# Patient Record
Sex: Female | Born: 1945 | Hispanic: No | Marital: Single | Smoking: Former smoker
Health system: Southern US, Community
[De-identification: ages and names within clinical notes are randomized; demographics above are authoritative.]

## PROBLEM LIST (undated history)

## (undated) DIAGNOSIS — K759 Inflammatory liver disease, unspecified: Secondary | ICD-10-CM

## (undated) DIAGNOSIS — F419 Anxiety disorder, unspecified: Secondary | ICD-10-CM

---

## 2004-02-06 ENCOUNTER — Other Ambulatory Visit: Admission: RE | Admit: 2004-02-06 | Discharge: 2004-02-06 | Payer: Self-pay | Admitting: Family Medicine

## 2010-10-19 ENCOUNTER — Encounter: Payer: Self-pay | Admitting: Family Medicine

## 2018-05-02 DIAGNOSIS — R69 Illness, unspecified: Secondary | ICD-10-CM | POA: Diagnosis not present

## 2018-05-02 DIAGNOSIS — E7800 Pure hypercholesterolemia, unspecified: Secondary | ICD-10-CM | POA: Diagnosis not present

## 2018-05-02 DIAGNOSIS — Z720 Tobacco use: Secondary | ICD-10-CM | POA: Diagnosis not present

## 2018-05-02 DIAGNOSIS — R748 Abnormal levels of other serum enzymes: Secondary | ICD-10-CM | POA: Diagnosis not present

## 2018-05-02 DIAGNOSIS — I10 Essential (primary) hypertension: Secondary | ICD-10-CM | POA: Diagnosis not present

## 2018-05-02 DIAGNOSIS — R7301 Impaired fasting glucose: Secondary | ICD-10-CM | POA: Diagnosis not present

## 2018-05-03 DIAGNOSIS — R740 Nonspecific elevation of levels of transaminase and lactic acid dehydrogenase [LDH]: Secondary | ICD-10-CM | POA: Diagnosis not present

## 2018-05-03 DIAGNOSIS — R7989 Other specified abnormal findings of blood chemistry: Secondary | ICD-10-CM | POA: Diagnosis not present

## 2018-05-05 ENCOUNTER — Other Ambulatory Visit: Payer: Self-pay | Admitting: Family Medicine

## 2018-05-05 DIAGNOSIS — R945 Abnormal results of liver function studies: Principal | ICD-10-CM

## 2018-05-05 DIAGNOSIS — R7989 Other specified abnormal findings of blood chemistry: Secondary | ICD-10-CM

## 2018-05-06 DIAGNOSIS — R740 Nonspecific elevation of levels of transaminase and lactic acid dehydrogenase [LDH]: Secondary | ICD-10-CM | POA: Diagnosis not present

## 2018-05-11 ENCOUNTER — Ambulatory Visit
Admission: RE | Admit: 2018-05-11 | Discharge: 2018-05-11 | Disposition: A | Discharge status: Home / Self Care | Payer: Medicare HMO | Source: Ambulatory Visit | Attending: Family Medicine | Admitting: Family Medicine

## 2018-05-11 DIAGNOSIS — R7989 Other specified abnormal findings of blood chemistry: Secondary | ICD-10-CM

## 2018-05-11 DIAGNOSIS — R945 Abnormal results of liver function studies: Principal | ICD-10-CM

## 2018-05-17 DIAGNOSIS — R69 Illness, unspecified: Secondary | ICD-10-CM | POA: Diagnosis not present

## 2018-05-18 DIAGNOSIS — K7689 Other specified diseases of liver: Secondary | ICD-10-CM | POA: Diagnosis not present

## 2018-05-18 DIAGNOSIS — R7989 Other specified abnormal findings of blood chemistry: Secondary | ICD-10-CM | POA: Diagnosis not present

## 2018-05-19 DIAGNOSIS — R7989 Other specified abnormal findings of blood chemistry: Secondary | ICD-10-CM | POA: Diagnosis not present

## 2018-05-19 DIAGNOSIS — R945 Abnormal results of liver function studies: Secondary | ICD-10-CM | POA: Diagnosis not present

## 2018-05-26 ENCOUNTER — Other Ambulatory Visit (HOSPITAL_COMMUNITY): Payer: Self-pay | Admitting: Gastroenterology

## 2018-05-26 DIAGNOSIS — R945 Abnormal results of liver function studies: Principal | ICD-10-CM

## 2018-05-26 DIAGNOSIS — R7989 Other specified abnormal findings of blood chemistry: Secondary | ICD-10-CM

## 2018-05-26 DIAGNOSIS — R740 Nonspecific elevation of levels of transaminase and lactic acid dehydrogenase [LDH]: Secondary | ICD-10-CM | POA: Diagnosis not present

## 2018-06-01 ENCOUNTER — Other Ambulatory Visit: Payer: Self-pay | Admitting: Student

## 2018-06-01 ENCOUNTER — Other Ambulatory Visit: Payer: Self-pay | Admitting: Radiology

## 2018-06-02 ENCOUNTER — Other Ambulatory Visit: Payer: Self-pay

## 2018-06-02 ENCOUNTER — Ambulatory Visit (HOSPITAL_COMMUNITY)
Admission: RE | Admit: 2018-06-02 | Discharge: 2018-06-02 | Disposition: A | Discharge status: Home / Self Care | Payer: Medicare HMO | Source: Ambulatory Visit | Attending: Gastroenterology | Admitting: Gastroenterology

## 2018-06-02 ENCOUNTER — Other Ambulatory Visit: Payer: Self-pay | Admitting: Radiology

## 2018-06-02 DIAGNOSIS — R748 Abnormal levels of other serum enzymes: Secondary | ICD-10-CM | POA: Diagnosis not present

## 2018-06-02 DIAGNOSIS — Z79899 Other long term (current) drug therapy: Secondary | ICD-10-CM | POA: Insufficient documentation

## 2018-06-02 DIAGNOSIS — R945 Abnormal results of liver function studies: Secondary | ICD-10-CM

## 2018-06-02 DIAGNOSIS — K740 Hepatic fibrosis: Secondary | ICD-10-CM | POA: Diagnosis not present

## 2018-06-02 DIAGNOSIS — R790 Abnormal level of blood mineral: Secondary | ICD-10-CM | POA: Diagnosis not present

## 2018-06-02 DIAGNOSIS — R7989 Other specified abnormal findings of blood chemistry: Secondary | ICD-10-CM

## 2018-06-02 DIAGNOSIS — I10 Essential (primary) hypertension: Secondary | ICD-10-CM | POA: Diagnosis not present

## 2018-06-02 LAB — CBC
HEMATOCRIT: 44.1 % (ref 36.0–46.0)
Hemoglobin: 14.6 g/dL (ref 12.0–15.0)
MCH: 30.2 pg (ref 26.0–34.0)
MCHC: 33.1 g/dL (ref 30.0–36.0)
MCV: 91.3 fL (ref 78.0–100.0)
PLATELETS: 231 10*3/uL (ref 150–400)
RBC: 4.83 MIL/uL (ref 3.87–5.11)
RDW: 16.8 % — AB (ref 11.5–15.5)
WBC: 7.4 10*3/uL (ref 4.0–10.5)

## 2018-06-02 LAB — PROTIME-INR
INR: 0.92
Prothrombin Time: 12.3 seconds (ref 11.4–15.2)

## 2018-06-02 MED ORDER — OXYCODONE HCL 5 MG PO TABS: 5.0000 mg | ORAL_TABLET | ORAL | Status: DC | PRN

## 2018-06-02 MED ORDER — SODIUM CHLORIDE 0.9 % IV SOLN
INTRAVENOUS | Status: AC | PRN
  Administered 2018-06-02: 10 mL/h via INTRAVENOUS

## 2018-06-02 MED ORDER — GELATIN ABSORBABLE 12-7 MM EX MISC
CUTANEOUS | Status: AC
  Filled 2018-06-02: qty 1

## 2018-06-02 MED ORDER — MIDAZOLAM HCL 2 MG/2ML IJ SOLN
INTRAMUSCULAR | Status: AC
  Filled 2018-06-02: qty 2

## 2018-06-02 MED ORDER — FENTANYL CITRATE (PF) 100 MCG/2ML IJ SOLN
INTRAMUSCULAR | Status: AC
  Filled 2018-06-02: qty 2

## 2018-06-02 MED ORDER — MIDAZOLAM HCL 2 MG/2ML IJ SOLN
INTRAMUSCULAR | Status: AC | PRN
  Administered 2018-06-02 (×2): 1 mg via INTRAVENOUS

## 2018-06-02 MED ORDER — HYDRALAZINE HCL 20 MG/ML IJ SOLN
INTRAMUSCULAR | Status: AC
  Filled 2018-06-02: qty 1

## 2018-06-02 MED ORDER — SODIUM CHLORIDE 0.9 % IV SOLN: INTRAVENOUS | Status: DC

## 2018-06-02 MED ORDER — HYDRALAZINE HCL 20 MG/ML IJ SOLN
INTRAMUSCULAR | Status: AC | PRN
  Administered 2018-06-02: 5 mg via INTRAVENOUS

## 2018-06-02 MED ORDER — LIDOCAINE HCL (PF) 1 % IJ SOLN
INTRAMUSCULAR | Status: AC
  Filled 2018-06-02: qty 30

## 2018-06-02 MED ORDER — FENTANYL CITRATE (PF) 100 MCG/2ML IJ SOLN
INTRAMUSCULAR | Status: AC | PRN
  Administered 2018-06-02 (×4): 25 ug via INTRAVENOUS

## 2018-06-02 NOTE — Procedures (Signed)
  Pre-operative Diagnosis: Elevated liver enzymes      Post-operative Diagnosis: Elevated liver enzymes      Indications: Needs biopsy  Procedure: US guided random liver biopsy  Findings: 3 cores from right hepatic lobe.  Gelfoam slurry injected after biopsy.  Complications: None     EBL: Minimal  Plan: Bedrest 3 hours.

## 2018-06-02 NOTE — H&P (Signed)
Chief Complaint: Patient was seen in consultation today for image guided random core liver biopsy  Referring Physician(s): Outlaw,William  Supervising Physician: Richarda Overlie  Patient Status: [facility] - Out-pt  History of Present Illness: [name] [name] is a 72 y.o. female smoker with history of hypertension, elevated liver function tests as well as elevated iron indices/storage who presents today for image guided random core liver biopsy for further evaluation.   No past medical history on file.     Allergies: Patient has no known allergies.  Medications: Prior to Admission medications   Medication Sig Start Date End Date Taking? Authorizing Provider  metoprolol succinate (TOPROL-XL) 50 MG 24 hr tablet Take 50 mg by mouth daily. Take with or immediately following a meal.   Yes [provider]     No family history on file.  Social History   Socioeconomic History  . Marital status: Single    Spouse name: Not on file  . Number of children: Not on file  . Years of education: Not on file  . Highest education level: Not on file  Occupational History  . Not on file  Social Needs  . Financial resource strain: Not on file  . Food insecurity:    Worry: Not on file    Inability: Not on file  . Transportation needs:    Medical: Not on file    Non-medical: Not on file  Tobacco Use  . Smoking status: Not on file  Substance and Sexual Activity  . Alcohol use: Not on file  . Drug use: Not on file  . Sexual activity: Not on file  Lifestyle  . Physical activity:    Days per week: Not on file    Minutes per session: Not on file  . Stress: Not on file  Relationships  . Social connections:    Talks on phone: Not on file    Gets together: Not on file    Attends religious service: Not on file    Active member of club or organization: Not on file    Attends meetings of clubs or organizations: Not on file    Relationship status: Not on file  Other Topics Concern   . Not on file  Social History Narrative  . Not on file      Review of Systems currently denies fever, headache, chest pain, dyspnea, cough, abdominal/back pain, nausea, vomiting She is anxious.  Vital Signs: BP (!) 236/89 (BP Location: Right Arm)   Pulse 79   Temp 97.8 F (36.6 C) (Oral)   Ht 5\' 3"  (1.6 m)   Wt 125 lb (56.7 kg)   SpO2 100%   BMI 22.14 kg/m   Physical Exam awake, alert.  Chest with distant breath sounds bilaterally. Heart with regular rate and rhythm.  Abdomen soft, positive bowel sounds, nontender.  No lower extremity edema  Imaging: US Abdomen Limited Ruq  Result Date: 05/11/2018 CLINICAL DATA:  Elevated liver function tests EXAM: ULTRASOUND ABDOMEN LIMITED RIGHT UPPER QUADRANT COMPARISON:  None. FINDINGS: Gallbladder: No gallstones or wall thickening visualized. No sonographic Murphy sign noted by sonographer. Common bile duct: Diameter: 1.8 mm Liver: No focal lesion identified. Within normal limits in parenchymal echogenicity. Portal vein is patent on color Doppler imaging with normal direction of blood flow towards the liver. IMPRESSION: Negative right upper quadrant ultrasound Electronically Signed   By: Marlan Palau M.D.   On: 05/11/2018 15:55    Labs:  CBC: No results for input(s): WBC, HGB, HCT,  PLT in the last 8760 hours.  COAGS: No results for input(s): INR, APTT in the last 8760 hours.  BMP: No results for input(s): NA, K, CL, CO2, GLUCOSE, BUN, CALCIUM, CREATININE, GFRNONAA, GFRAA in the last 8760 hours.  Invalid input(s): CMP  LIVER FUNCTION TESTS: No results for input(s): BILITOT, AST, ALT, ALKPHOS, PROT, ALBUMIN in the last 8760 hours.  TUMOR MARKERS: No results for input(s): AFPTM, CEA, CA199, CHROMGRNA in the last 8760 hours.  Assessment and Plan: 72 y.o. female smoker with history of hypertension, elevated liver function tests as well as elevated iron indices/storage who presents today for image guided random core liver biopsy for  further evaluation. Risks and benefits discussed with the patient/sister including, but not limited to bleeding, infection, damage to adjacent structures or low yield requiring additional tests.  All of the patient's questions were answered, patient is agreeable to proceed. Consent signed and in chart.  LABS PENDING    Thank you for this interesting consult.  I greatly enjoyed meeting Sharma Lawrance Vu and look forward to participating in their care.  A copy of this report was sent to the requesting provider on this date.  Electronically Signed: D. Jeananne Rama, PA-C 06/02/2018, 11:17 AM   I spent a total of  25 minutes   in face to face in clinical consultation, greater than 50% of which was counseling/coordinating care for image guided random core liver biopsy

## 2018-06-02 NOTE — Discharge Instructions (Addendum)
Liver Biopsy, Care After °These instructions give you information on caring for yourself after your procedure. Your doctor may also give you more specific instructions. Call your doctor if you have any problems or questions after your procedure. °Follow these instructions at home: °· Rest at home for 1-2 days or as told by your doctor. °· Have someone stay with you for at least 24 hours. °· Do not do these things in the first 24 hours: °? Drive. °? Use machinery. °? Take care of other people. °? Sign legal documents. °? Take a bath or shower. °· There are many different ways to close and cover a cut (incision). For example, a cut can be closed with stitches, skin glue, or adhesive strips. Follow your doctor's instructions on: °? Taking care of your cut. °? Changing and removing your bandage (dressing). °? Removing whatever was used to close your cut. °· Do not drink alcohol in the first week. °· Do not lift more than 5 pounds or play contact sports for the first 2 weeks. °· Take medicines only as told by your doctor. For 1 week, do not take medicine that has aspirin in it or medicines like ibuprofen. °· Get your test results. °Contact a doctor if: °· A cut bleeds and leaves more than just a small spot of blood. °· A cut is red, puffs up (swells), or hurts more than before. °· Fluid or something else comes from a cut. °· A cut smells bad. °· You have a fever or chills. °Get help right away if: °· You have swelling, bloating, or pain in your belly (abdomen). °· You get dizzy or faint. °· You have a rash. °· You feel sick to your stomach (nauseous) or throw up (vomit). °· You have trouble breathing, feel short of breath, or feel faint. °· Your chest hurts. °· You have problems talking or seeing. °· You have trouble balancing or moving your arms or legs. °This information is not intended to replace advice given to you by your health care provider. Make sure you discuss any questions you have with your health care  provider. °Document Released: 06/23/2008 Document Revised: 02/20/2016 Document Reviewed: 11/10/2013 °Elsevier Interactive Patient Education © 2018 [employer] ° ° ° ° °Moderate Conscious Sedation, Adult, Care After °These instructions provide you with information about caring for yourself after your procedure. Your health care provider may also give you more specific instructions. Your treatment has been planned according to current medical practices, but problems sometimes occur. Call your health care provider if you have any problems or questions after your procedure. °What can I expect after the procedure? °After your procedure, it is common: °· To feel sleepy for several hours. °· To feel clumsy and have poor balance for several hours. °· To have poor judgment for several hours. °· To vomit if you eat too soon. ° °Follow these instructions at home: °For at least 24 hours after the procedure: ° °· Do not: °? Participate in activities where you could fall or become injured. °? Drive. °? Use heavy machinery. °? Drink alcohol. °? Take sleeping pills or medicines that cause drowsiness. °? Make important decisions or sign legal documents. °? Take care of children on your own. °· Rest. °Eating and drinking °· Follow the diet recommended by your health care provider. °· If you vomit: °? Drink water, juice, or soup when you can drink without vomiting. °? Make sure you have little or no nausea before eating solid foods. °General instructions °·   Have a responsible adult stay with you until you are awake and alert. °· Take over-the-counter and prescription medicines only as told by your health care provider. °· If you smoke, do not smoke without supervision. °· Keep all follow-up visits as told by your health care provider. This is important. °Contact a health care provider if: °· You keep feeling nauseous or you keep vomiting. °· You feel light-headed. °· You develop a rash. °· You have a fever. °Get help right away  if: °· You have trouble breathing. °This information is not intended to replace advice given to you by your health care provider. Make sure you discuss any questions you have with your health care provider. °Document Released: 07/05/2013 Document Revised: 02/17/2016 Document Reviewed: 01/04/2016 °Elsevier Interactive Patient Education © 2018 [employer] Inc[employer] ° ° °

## 2018-06-17 DIAGNOSIS — R69 Illness, unspecified: Secondary | ICD-10-CM | POA: Diagnosis not present

## 2018-06-17 DIAGNOSIS — I10 Essential (primary) hypertension: Secondary | ICD-10-CM | POA: Diagnosis not present

## 2018-06-17 DIAGNOSIS — R945 Abnormal results of liver function studies: Secondary | ICD-10-CM | POA: Diagnosis not present

## 2018-06-17 DIAGNOSIS — R000 Tachycardia, unspecified: Secondary | ICD-10-CM | POA: Diagnosis not present

## 2018-06-21 DIAGNOSIS — R69 Illness, unspecified: Secondary | ICD-10-CM | POA: Diagnosis not present

## 2018-06-21 DIAGNOSIS — I10 Essential (primary) hypertension: Secondary | ICD-10-CM | POA: Diagnosis not present

## 2018-06-21 DIAGNOSIS — K754 Autoimmune hepatitis: Secondary | ICD-10-CM | POA: Diagnosis not present

## 2018-06-24 DIAGNOSIS — R69 Illness, unspecified: Secondary | ICD-10-CM | POA: Diagnosis not present

## 2018-06-24 DIAGNOSIS — I10 Essential (primary) hypertension: Secondary | ICD-10-CM | POA: Diagnosis not present

## 2018-06-24 DIAGNOSIS — K754 Autoimmune hepatitis: Secondary | ICD-10-CM | POA: Diagnosis not present

## 2018-07-15 ENCOUNTER — Other Ambulatory Visit (HOSPITAL_COMMUNITY)
Admission: RE | Admit: 2018-07-15 | Discharge: 2018-07-15 | Disposition: A | Discharge status: Home / Self Care | Payer: Medicare HMO | Source: Ambulatory Visit | Attending: Anatomic Pathology | Admitting: Anatomic Pathology

## 2018-07-15 DIAGNOSIS — I10 Essential (primary) hypertension: Secondary | ICD-10-CM | POA: Insufficient documentation

## 2018-07-15 DIAGNOSIS — R945 Abnormal results of liver function studies: Secondary | ICD-10-CM | POA: Insufficient documentation

## 2018-07-15 DIAGNOSIS — R7301 Impaired fasting glucose: Secondary | ICD-10-CM | POA: Diagnosis not present

## 2018-07-15 DIAGNOSIS — F17200 Nicotine dependence, unspecified, uncomplicated: Secondary | ICD-10-CM | POA: Diagnosis not present

## 2018-07-15 DIAGNOSIS — E7800 Pure hypercholesterolemia, unspecified: Secondary | ICD-10-CM | POA: Diagnosis not present

## 2018-07-15 DIAGNOSIS — R69 Illness, unspecified: Secondary | ICD-10-CM | POA: Diagnosis not present

## 2018-07-15 DIAGNOSIS — K754 Autoimmune hepatitis: Secondary | ICD-10-CM | POA: Diagnosis not present

## 2018-07-19 DIAGNOSIS — K754 Autoimmune hepatitis: Secondary | ICD-10-CM | POA: Diagnosis not present

## 2018-08-09 ENCOUNTER — Encounter (HOSPITAL_COMMUNITY): Payer: Self-pay

## 2018-08-16 DIAGNOSIS — R69 Illness, unspecified: Secondary | ICD-10-CM | POA: Diagnosis not present

## 2018-08-22 DIAGNOSIS — R69 Illness, unspecified: Secondary | ICD-10-CM | POA: Diagnosis not present

## 2018-08-31 DIAGNOSIS — K754 Autoimmune hepatitis: Secondary | ICD-10-CM | POA: Diagnosis not present

## 2018-09-26 DIAGNOSIS — K754 Autoimmune hepatitis: Secondary | ICD-10-CM | POA: Diagnosis not present

## 2018-09-26 DIAGNOSIS — R69 Illness, unspecified: Secondary | ICD-10-CM | POA: Diagnosis not present

## 2018-09-26 DIAGNOSIS — I10 Essential (primary) hypertension: Secondary | ICD-10-CM | POA: Diagnosis not present

## 2018-10-27 DIAGNOSIS — R69 Illness, unspecified: Secondary | ICD-10-CM | POA: Diagnosis not present

## 2018-11-10 DIAGNOSIS — K754 Autoimmune hepatitis: Secondary | ICD-10-CM | POA: Diagnosis not present

## 2018-11-23 DIAGNOSIS — R69 Illness, unspecified: Secondary | ICD-10-CM | POA: Diagnosis not present

## 2018-12-20 DIAGNOSIS — Z720 Tobacco use: Secondary | ICD-10-CM | POA: Diagnosis not present

## 2018-12-20 DIAGNOSIS — K754 Autoimmune hepatitis: Secondary | ICD-10-CM | POA: Diagnosis not present

## 2018-12-20 DIAGNOSIS — K740 Hepatic fibrosis: Secondary | ICD-10-CM | POA: Diagnosis not present

## 2018-12-26 DIAGNOSIS — R945 Abnormal results of liver function studies: Secondary | ICD-10-CM | POA: Diagnosis not present

## 2018-12-26 DIAGNOSIS — K740 Hepatic fibrosis: Secondary | ICD-10-CM | POA: Diagnosis not present

## 2018-12-26 DIAGNOSIS — K754 Autoimmune hepatitis: Secondary | ICD-10-CM | POA: Diagnosis not present

## 2018-12-27 DIAGNOSIS — K754 Autoimmune hepatitis: Secondary | ICD-10-CM | POA: Diagnosis not present

## 2019-02-13 DIAGNOSIS — I10 Essential (primary) hypertension: Secondary | ICD-10-CM | POA: Diagnosis not present

## 2019-02-13 DIAGNOSIS — R531 Weakness: Secondary | ICD-10-CM | POA: Diagnosis not present

## 2019-02-13 DIAGNOSIS — K754 Autoimmune hepatitis: Secondary | ICD-10-CM | POA: Diagnosis not present

## 2019-02-13 DIAGNOSIS — R7989 Other specified abnormal findings of blood chemistry: Secondary | ICD-10-CM | POA: Diagnosis not present

## 2019-02-21 DIAGNOSIS — K754 Autoimmune hepatitis: Secondary | ICD-10-CM | POA: Diagnosis not present

## 2019-02-22 DIAGNOSIS — R69 Illness, unspecified: Secondary | ICD-10-CM | POA: Diagnosis not present

## 2019-03-27 DIAGNOSIS — K754 Autoimmune hepatitis: Secondary | ICD-10-CM | POA: Diagnosis not present

## 2019-05-29 DIAGNOSIS — R69 Illness, unspecified: Secondary | ICD-10-CM | POA: Diagnosis not present

## 2019-08-03 DIAGNOSIS — K754 Autoimmune hepatitis: Secondary | ICD-10-CM | POA: Diagnosis not present

## 2019-11-06 DIAGNOSIS — K754 Autoimmune hepatitis: Secondary | ICD-10-CM | POA: Diagnosis not present

## 2019-11-13 DIAGNOSIS — E875 Hyperkalemia: Secondary | ICD-10-CM | POA: Diagnosis not present

## 2019-12-19 DIAGNOSIS — R69 Illness, unspecified: Secondary | ICD-10-CM | POA: Diagnosis not present

## 2020-02-01 DIAGNOSIS — K754 Autoimmune hepatitis: Secondary | ICD-10-CM | POA: Diagnosis not present

## 2020-02-03 ENCOUNTER — Ambulatory Visit: Payer: Medicare HMO | Attending: Internal Medicine

## 2020-02-03 DIAGNOSIS — Z23 Encounter for immunization: Secondary | ICD-10-CM

## 2020-02-03 NOTE — Progress Notes (Signed)
   Covid-19 Vaccination Clinic  Name:  Adalea Handler    MRN: [id number] DOB: 01-Nov-1945  02/03/2020  Ms. Doeden was observed post Covid-19 immunization for 15 minutes without incident. She was provided with Vaccine Information Sheet and instruction to access the V-Safe system.   Ms. Poulton was instructed to call 911 with any severe reactions post vaccine: Marland Kitchen Difficulty breathing  . Swelling of face and throat  . A fast heartbeat  . A bad rash all over body  . Dizziness and weakness   Immunizations Administered    Name Date Dose VIS Date Route   Pfizer COVID-19 Vaccine 02/03/2020  9:10 AM 0.3 mL 11/22/2018 Intramuscular   Manufacturer: [employer], [employer]   Lot: [id number]   NDC: [address]-2

## 2020-02-05 DIAGNOSIS — E7800 Pure hypercholesterolemia, unspecified: Secondary | ICD-10-CM | POA: Diagnosis not present

## 2020-02-05 DIAGNOSIS — K754 Autoimmune hepatitis: Secondary | ICD-10-CM | POA: Diagnosis not present

## 2020-02-05 DIAGNOSIS — R69 Illness, unspecified: Secondary | ICD-10-CM | POA: Diagnosis not present

## 2020-02-05 DIAGNOSIS — R7301 Impaired fasting glucose: Secondary | ICD-10-CM | POA: Diagnosis not present

## 2020-02-05 DIAGNOSIS — I10 Essential (primary) hypertension: Secondary | ICD-10-CM | POA: Diagnosis not present

## 2020-02-27 ENCOUNTER — Ambulatory Visit: Payer: Medicare HMO | Attending: Internal Medicine

## 2020-02-27 DIAGNOSIS — Z23 Encounter for immunization: Secondary | ICD-10-CM

## 2020-02-27 NOTE — Progress Notes (Signed)
   Covid-19 Vaccination Clinic  Name:  Avon Molock    MRN: [id number] DOB: 1946-02-10  02/27/2020  Ms. Sinor was observed post Covid-19 immunization for 15 minutes without incident. She was provided with Vaccine Information Sheet and instruction to access the V-Safe system.   Ms. Fede was instructed to call 911 with any severe reactions post vaccine: Marland Kitchen Difficulty breathing  . Swelling of face and throat  . A fast heartbeat  . A bad rash all over body  . Dizziness and weakness   Immunizations Administered    Name Date Dose VIS Date Route   Pfizer COVID-19 Vaccine 02/27/2020  9:00 AM 0.3 mL 11/22/2018 Intramuscular   Manufacturer: [employer], [employer]   Lot: [id number]   NDC: [address]-9

## 2020-03-20 DIAGNOSIS — R69 Illness, unspecified: Secondary | ICD-10-CM | POA: Diagnosis not present

## 2020-05-06 DIAGNOSIS — E7800 Pure hypercholesterolemia, unspecified: Secondary | ICD-10-CM | POA: Diagnosis not present

## 2020-05-06 DIAGNOSIS — I10 Essential (primary) hypertension: Secondary | ICD-10-CM | POA: Diagnosis not present

## 2020-05-06 DIAGNOSIS — R69 Illness, unspecified: Secondary | ICD-10-CM | POA: Diagnosis not present

## 2020-05-06 DIAGNOSIS — K754 Autoimmune hepatitis: Secondary | ICD-10-CM | POA: Diagnosis not present

## 2020-05-06 DIAGNOSIS — R7301 Impaired fasting glucose: Secondary | ICD-10-CM | POA: Diagnosis not present

## 2020-05-08 DIAGNOSIS — E875 Hyperkalemia: Secondary | ICD-10-CM | POA: Diagnosis not present

## 2020-06-13 DIAGNOSIS — E7800 Pure hypercholesterolemia, unspecified: Secondary | ICD-10-CM | POA: Diagnosis not present

## 2020-06-13 DIAGNOSIS — K754 Autoimmune hepatitis: Secondary | ICD-10-CM | POA: Diagnosis not present

## 2020-06-13 DIAGNOSIS — I10 Essential (primary) hypertension: Secondary | ICD-10-CM | POA: Diagnosis not present

## 2020-06-13 DIAGNOSIS — R69 Illness, unspecified: Secondary | ICD-10-CM | POA: Diagnosis not present

## 2020-06-13 DIAGNOSIS — R7301 Impaired fasting glucose: Secondary | ICD-10-CM | POA: Diagnosis not present

## 2020-06-18 DIAGNOSIS — R69 Illness, unspecified: Secondary | ICD-10-CM | POA: Diagnosis not present

## 2020-08-02 IMAGING — US US ABDOMEN LIMITED
1 series · 14 of 25 positions shown · non-contrast
Comparison: None.

CLINICAL DATA: Elevated liver function tests

EXAM:
ULTRASOUND ABDOMEN LIMITED RIGHT UPPER QUADRANT

[Series 1: us abdomen limited · 0.15mm/px · 14 of 33 slices shown]
[im 1/33]
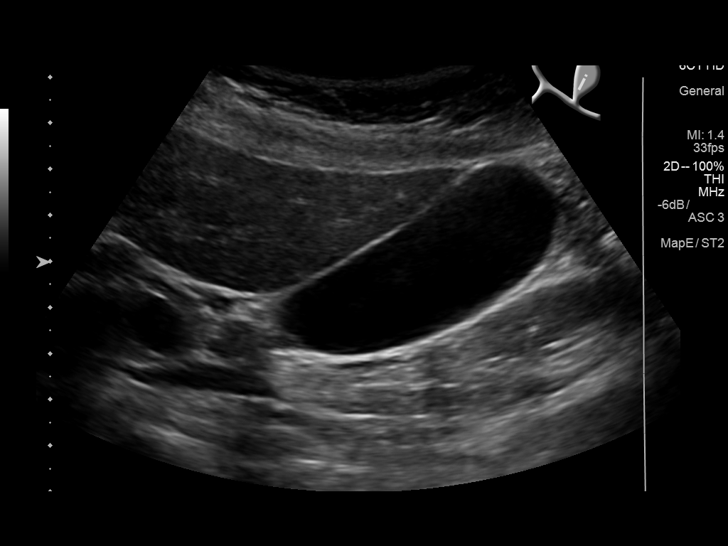
[im 3/33]
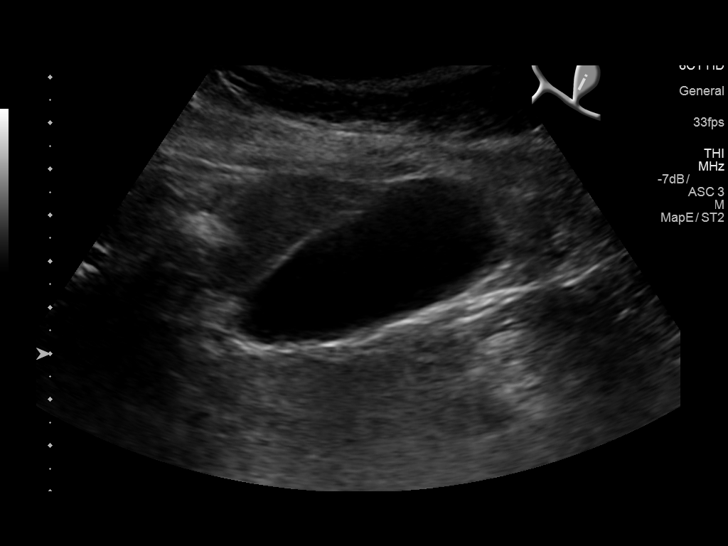
[im 6/33]
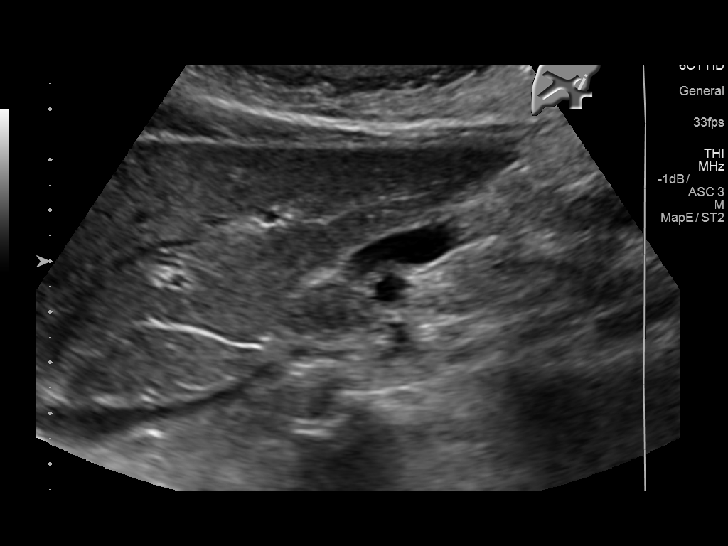
[im 9/33]
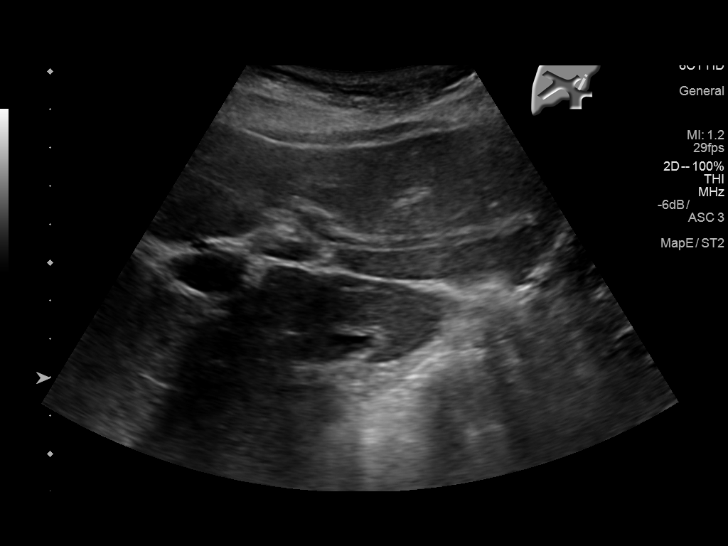
[im 11/33]
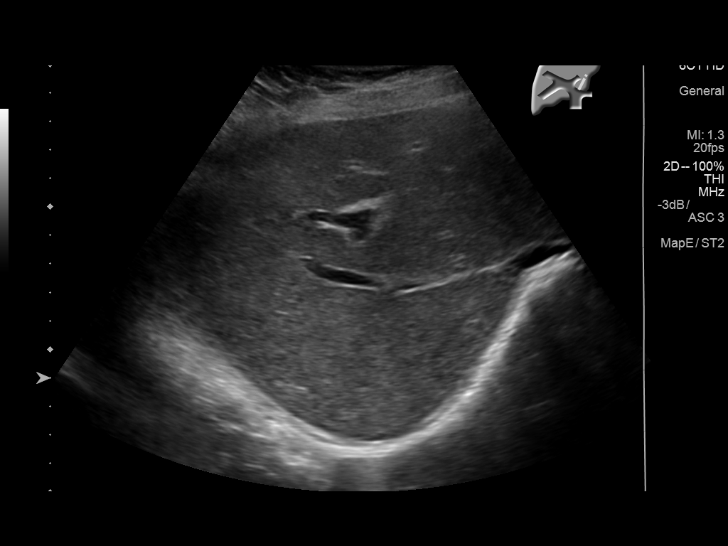
[im 13/33]
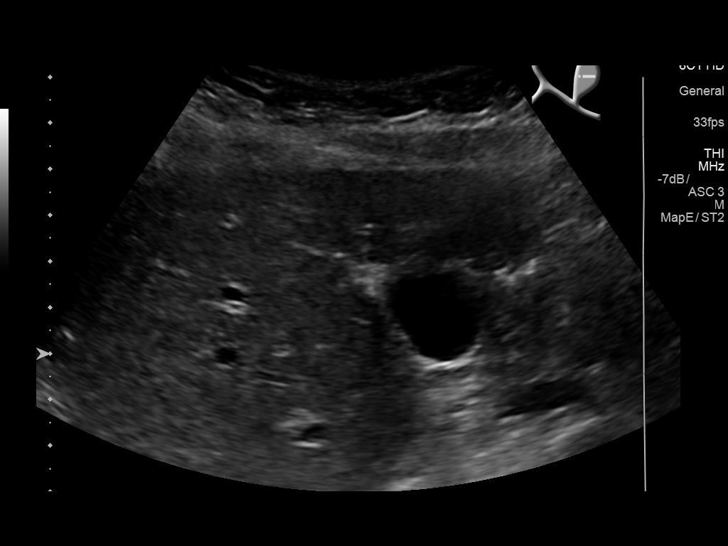
[im 15/33]
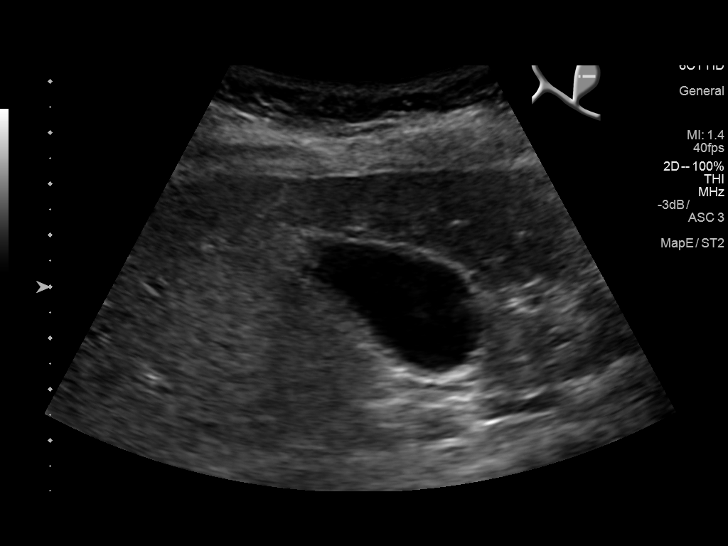
[im 18/33]
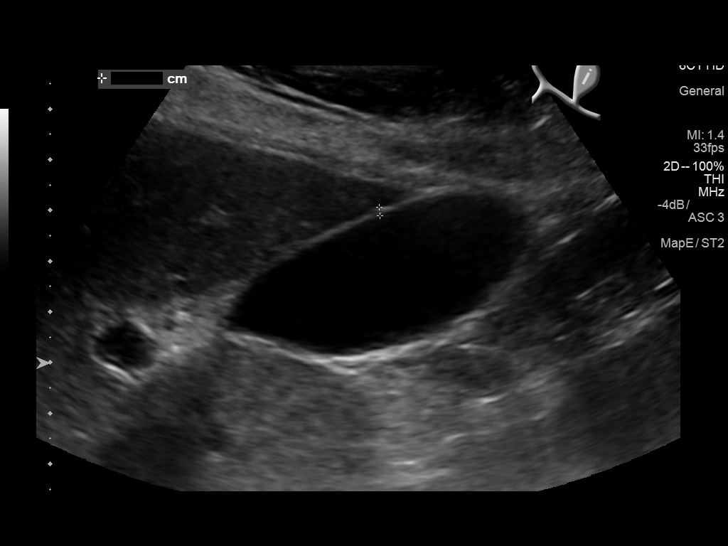
[im 21/33]
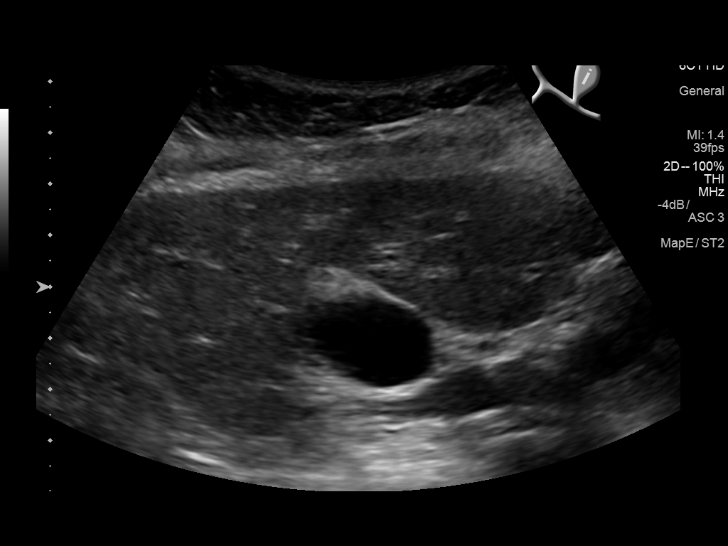
[im 22/33]
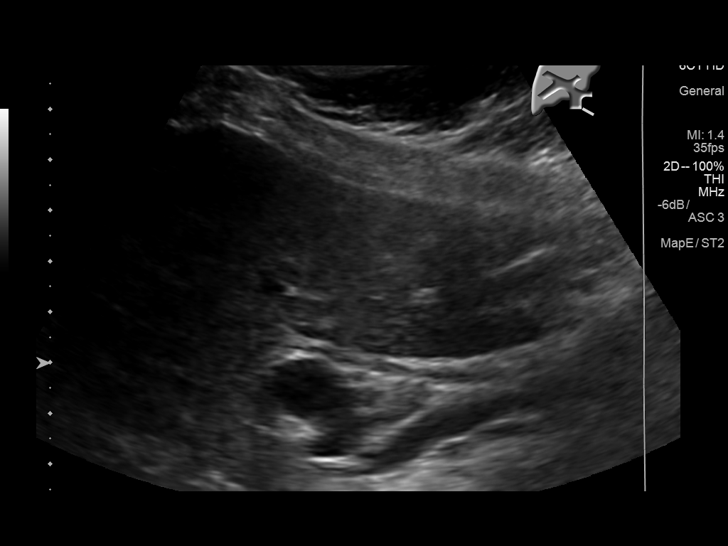
[im 25/33]
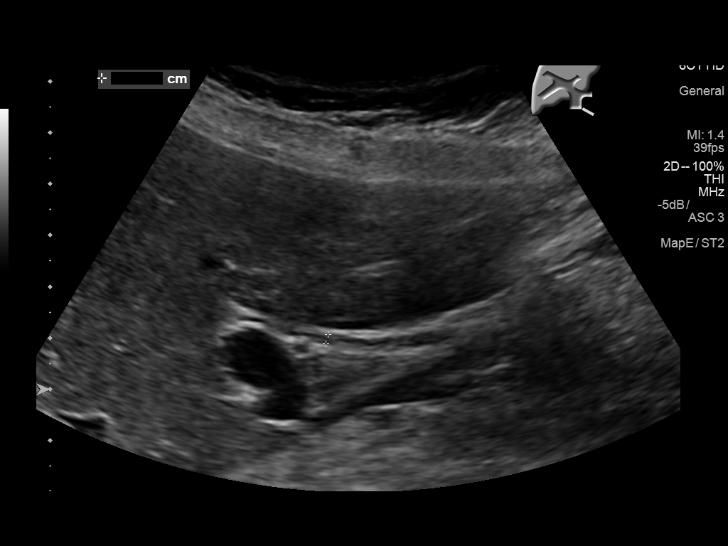
[im 27/33]
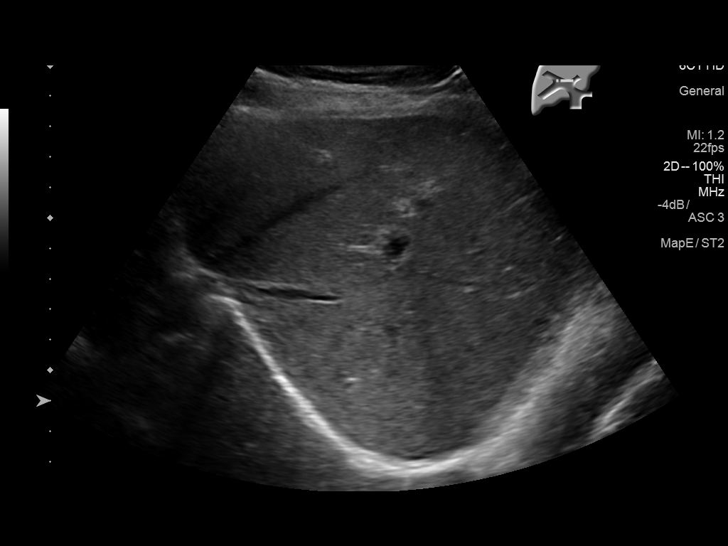
[im 30/33]
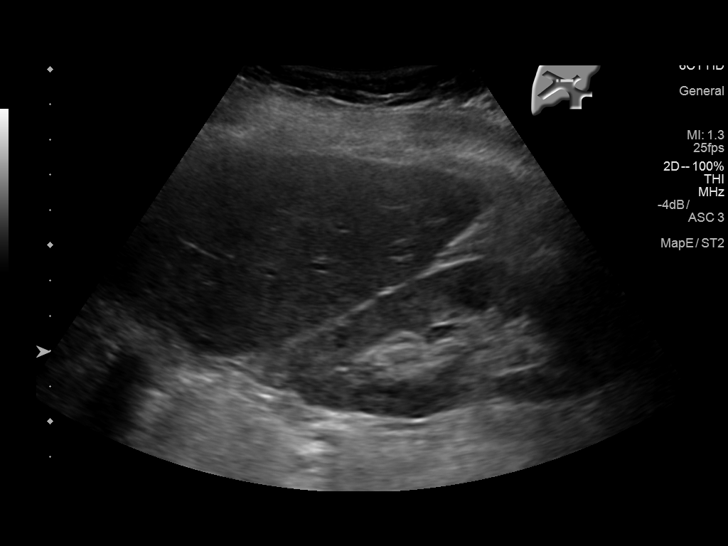
[im 33/33]
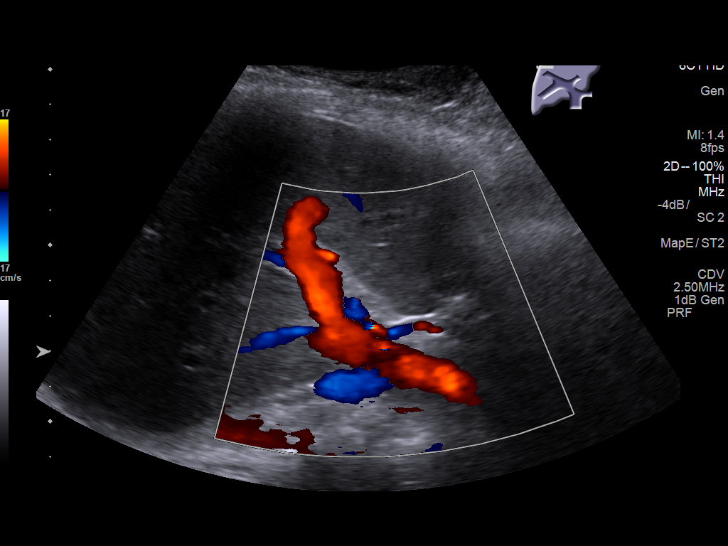

[14 of 25 positions shown; findings below may reference images not displayed]

FINDINGS: Gallbladder:

No gallstones or wall thickening visualized. No sonographic Murphy
sign noted by sonographer.

Common bile duct:

Diameter: 1.8 mm

Liver:

No focal lesion identified. Within normal limits in parenchymal
echogenicity. Portal vein is patent on color Doppler imaging with
normal direction of blood flow towards the liver.
IMPRESSION: Negative right upper quadrant ultrasound

## 2020-08-08 DIAGNOSIS — R7301 Impaired fasting glucose: Secondary | ICD-10-CM | POA: Diagnosis not present

## 2020-08-08 DIAGNOSIS — R69 Illness, unspecified: Secondary | ICD-10-CM | POA: Diagnosis not present

## 2020-08-08 DIAGNOSIS — E7800 Pure hypercholesterolemia, unspecified: Secondary | ICD-10-CM | POA: Diagnosis not present

## 2020-08-08 DIAGNOSIS — K754 Autoimmune hepatitis: Secondary | ICD-10-CM | POA: Diagnosis not present

## 2020-08-08 DIAGNOSIS — I10 Essential (primary) hypertension: Secondary | ICD-10-CM | POA: Diagnosis not present

## 2020-08-08 DIAGNOSIS — E875 Hyperkalemia: Secondary | ICD-10-CM | POA: Diagnosis not present

## 2020-08-13 DIAGNOSIS — R7301 Impaired fasting glucose: Secondary | ICD-10-CM | POA: Diagnosis not present

## 2020-08-13 DIAGNOSIS — R030 Elevated blood-pressure reading, without diagnosis of hypertension: Secondary | ICD-10-CM | POA: Diagnosis not present

## 2020-08-13 DIAGNOSIS — R69 Illness, unspecified: Secondary | ICD-10-CM | POA: Diagnosis not present

## 2020-08-13 DIAGNOSIS — K754 Autoimmune hepatitis: Secondary | ICD-10-CM | POA: Diagnosis not present

## 2020-08-13 DIAGNOSIS — I10 Essential (primary) hypertension: Secondary | ICD-10-CM | POA: Diagnosis not present

## 2020-08-13 DIAGNOSIS — Z720 Tobacco use: Secondary | ICD-10-CM | POA: Diagnosis not present

## 2020-08-13 DIAGNOSIS — E7800 Pure hypercholesterolemia, unspecified: Secondary | ICD-10-CM | POA: Diagnosis not present

## 2020-08-15 DIAGNOSIS — S8991XA Unspecified injury of right lower leg, initial encounter: Secondary | ICD-10-CM | POA: Diagnosis not present

## 2020-08-15 DIAGNOSIS — R030 Elevated blood-pressure reading, without diagnosis of hypertension: Secondary | ICD-10-CM | POA: Diagnosis not present

## 2020-08-15 DIAGNOSIS — S83421A Sprain of lateral collateral ligament of right knee, initial encounter: Secondary | ICD-10-CM | POA: Diagnosis not present

## 2020-08-24 IMAGING — US US BIOPSY CORE LIVER
1 series · 11 of 11 positions shown · non-contrast
Comparison: none

INDICATION: 72-year-old with elevated liver enzymes and increased storage iron.
Patient presents for random liver biopsy.

[Series 1: us biopsy core liver · 0.20mm/px · 11 of 11 slices shown]
[im 1/11]
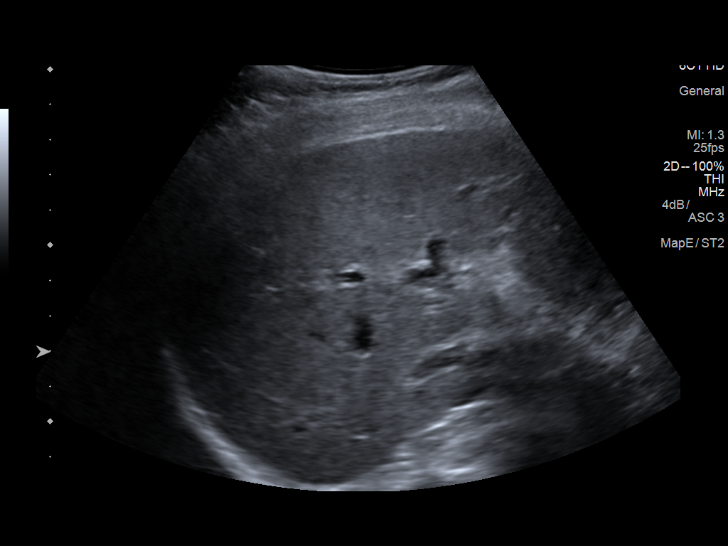
[im 2/11]
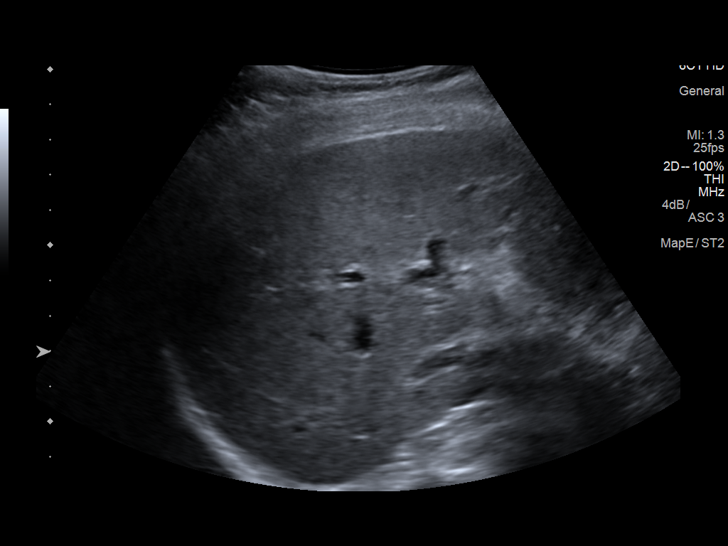
[im 3/11]
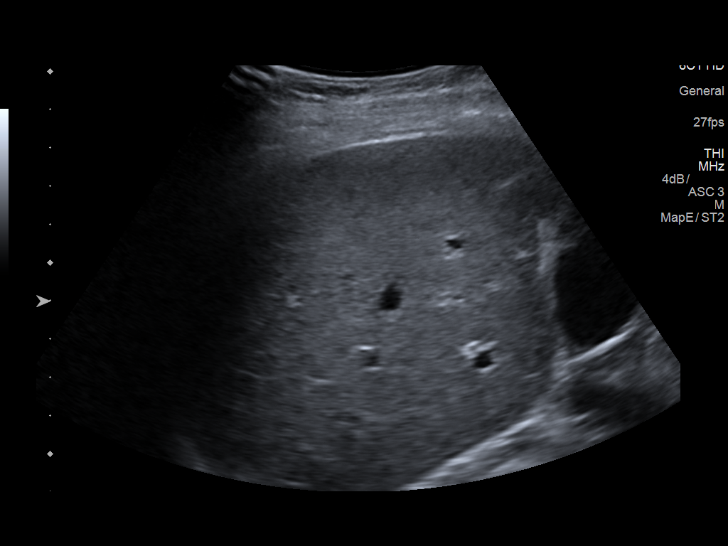
[im 4/11]
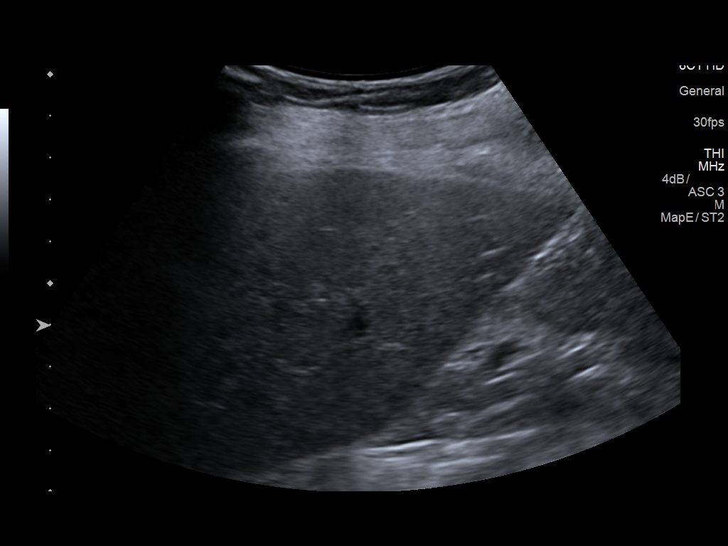
[im 5/11]
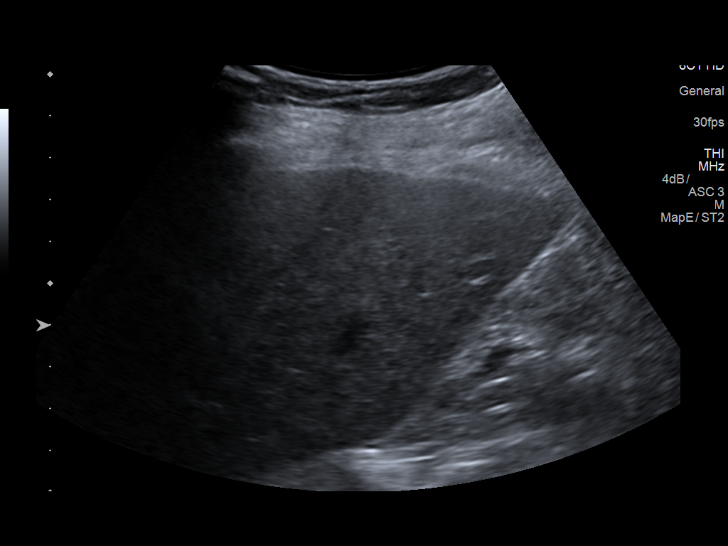
[im 6/11]
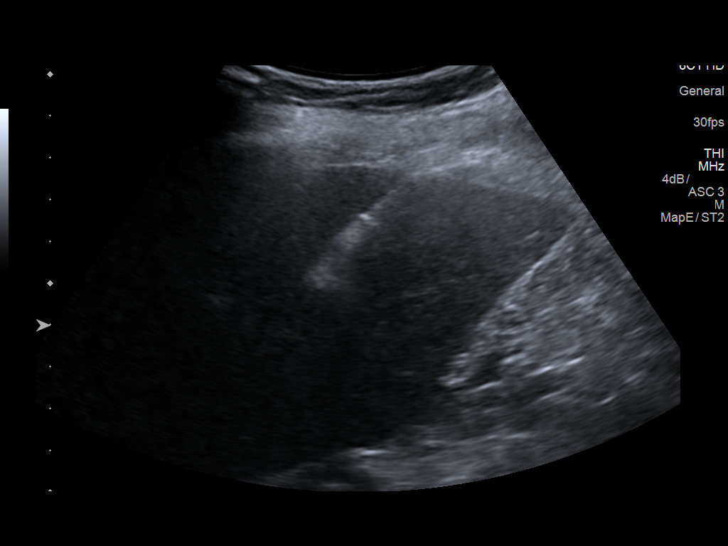
[im 7/11]
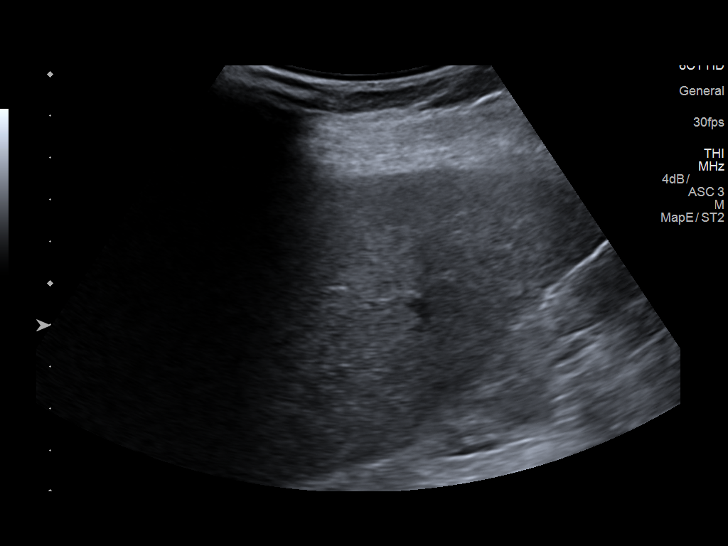
[im 8/11]
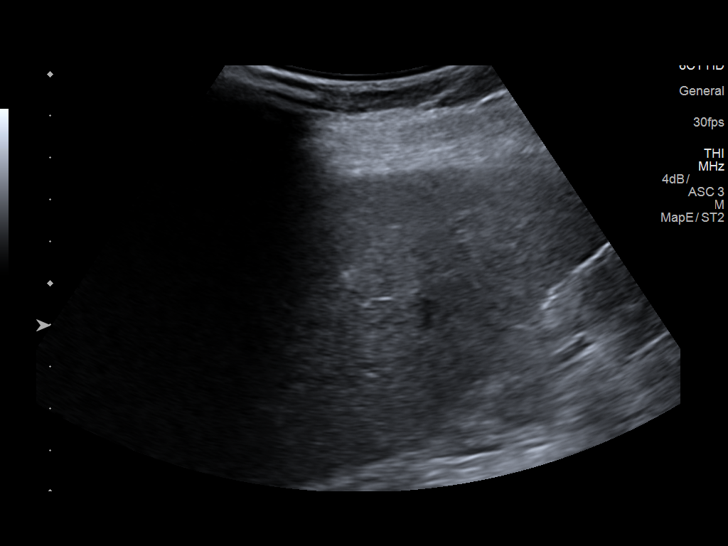
[im 9/11]
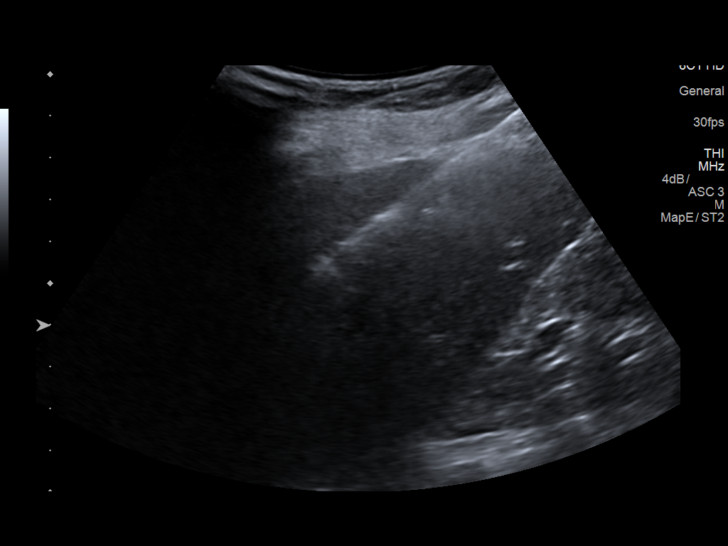
[im 10/11]
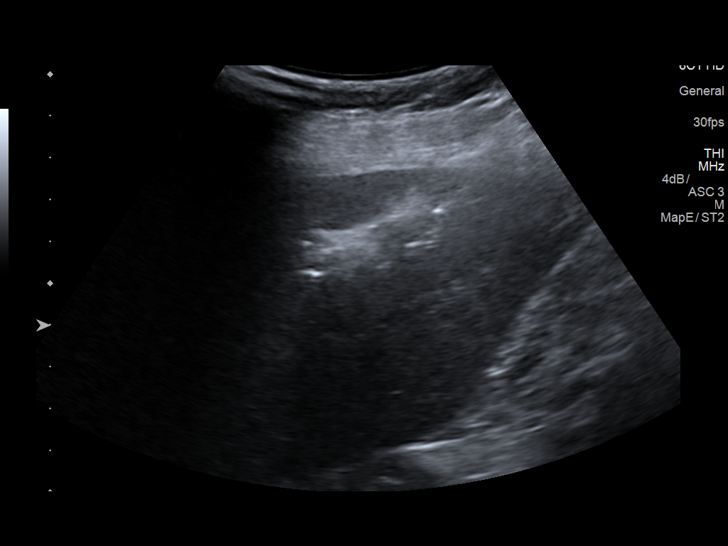
[im 11/11]
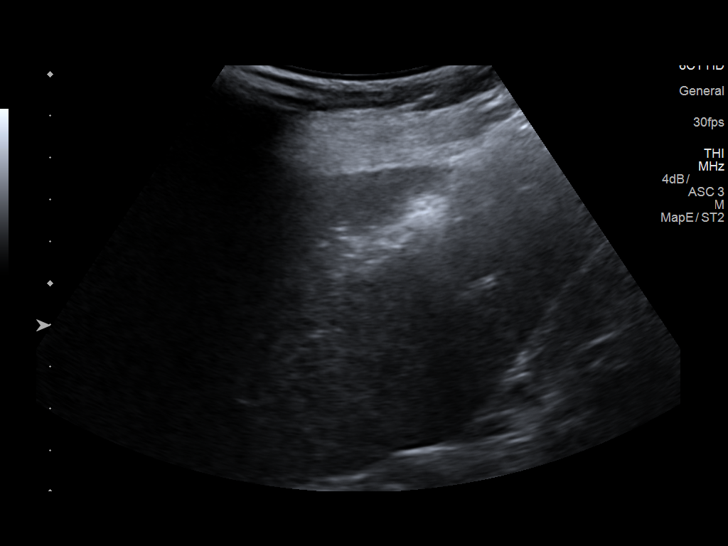

[11 of 11 positions shown; findings below may reference images not displayed]

EXAM:
ULTRASOUND-GUIDED RANDOM LIVER BIOPSY

MEDICATIONS:
Hydralazine 5 mg

ANESTHESIA/SEDATION:
Moderate (conscious) sedation was employed during this procedure. A
total of Versed 2 mg and Fentanyl 50 mcg was administered
intravenously.

Moderate Sedation Time: 20 minutes. The patient's level of
consciousness and vital signs were monitored continuously by
radiology nursing throughout the procedure under my direct
supervision.

FLUOROSCOPY TIME:  None

COMPLICATIONS:
None immediate.

PROCEDURE:
Informed written consent was obtained from the patient after a
thorough discussion of the procedural risks, benefits and
alternatives. All questions were addressed. Maximal Sterile Barrier
Technique was utilized including caps, mask, sterile gowns, sterile
gloves, sterile drape, hand hygiene and skin antiseptic. A timeout
was performed prior to the initiation of the procedure.

Patient was rolled on her left side. The right side of the abdomen
was prepped and draped in sterile fashion. Ultrasound was used to
image the right hepatic lobe. Skin was anesthetized with 1%
lidocaine. 17 gauge coaxial needle directed into the right hepatic
lobe with ultrasound guidance. A total of 3 core biopsies obtained
with an 18 gauge core device. Specimens placed in formalin. Gel-Foam
slurry was injected through the 17 gauge needle as it was removed.
Bandage placed over the puncture site.
FINDINGS: Normal appearance of the right hepatic lobe. Three adequate core
biopsies obtained with an 18 gauge core device. Specimens placed in
formalin. Gel-Foam slurry identified along the biopsy tract at the
end of the procedure. No evidence for bleeding or hematoma
formation.
IMPRESSION: Ultrasound-guided core biopsy of the right hepatic lobe.

## 2020-08-29 DIAGNOSIS — K754 Autoimmune hepatitis: Secondary | ICD-10-CM | POA: Diagnosis not present

## 2020-08-29 DIAGNOSIS — Z23 Encounter for immunization: Secondary | ICD-10-CM | POA: Diagnosis not present

## 2020-08-29 DIAGNOSIS — D849 Immunodeficiency, unspecified: Secondary | ICD-10-CM | POA: Diagnosis not present

## 2020-09-03 DIAGNOSIS — R69 Illness, unspecified: Secondary | ICD-10-CM | POA: Diagnosis not present

## 2020-09-03 DIAGNOSIS — I10 Essential (primary) hypertension: Secondary | ICD-10-CM | POA: Diagnosis not present

## 2020-09-03 DIAGNOSIS — K754 Autoimmune hepatitis: Secondary | ICD-10-CM | POA: Diagnosis not present

## 2020-09-10 DIAGNOSIS — R69 Illness, unspecified: Secondary | ICD-10-CM | POA: Diagnosis not present

## 2020-11-06 DIAGNOSIS — M25561 Pain in right knee: Secondary | ICD-10-CM | POA: Diagnosis not present

## 2020-11-08 DIAGNOSIS — K754 Autoimmune hepatitis: Secondary | ICD-10-CM | POA: Diagnosis not present

## 2020-11-18 DIAGNOSIS — M25561 Pain in right knee: Secondary | ICD-10-CM | POA: Diagnosis not present

## 2020-12-06 DIAGNOSIS — M25561 Pain in right knee: Secondary | ICD-10-CM | POA: Diagnosis not present

## 2020-12-13 DIAGNOSIS — M25561 Pain in right knee: Secondary | ICD-10-CM | POA: Diagnosis not present

## 2020-12-20 DIAGNOSIS — M25561 Pain in right knee: Secondary | ICD-10-CM | POA: Diagnosis not present

## 2020-12-25 DIAGNOSIS — M25561 Pain in right knee: Secondary | ICD-10-CM | POA: Diagnosis not present

## 2021-01-01 DIAGNOSIS — M25561 Pain in right knee: Secondary | ICD-10-CM | POA: Diagnosis not present

## 2021-01-08 DIAGNOSIS — M25561 Pain in right knee: Secondary | ICD-10-CM | POA: Diagnosis not present

## 2021-01-15 DIAGNOSIS — M25561 Pain in right knee: Secondary | ICD-10-CM | POA: Diagnosis not present

## 2021-01-22 DIAGNOSIS — M25561 Pain in right knee: Secondary | ICD-10-CM | POA: Diagnosis not present

## 2021-01-28 DIAGNOSIS — H31001 Unspecified chorioretinal scars, right eye: Secondary | ICD-10-CM | POA: Diagnosis not present

## 2021-01-29 DIAGNOSIS — M25561 Pain in right knee: Secondary | ICD-10-CM | POA: Diagnosis not present

## 2021-02-06 DIAGNOSIS — E875 Hyperkalemia: Secondary | ICD-10-CM | POA: Diagnosis not present

## 2021-02-06 DIAGNOSIS — K754 Autoimmune hepatitis: Secondary | ICD-10-CM | POA: Diagnosis not present

## 2021-02-06 DIAGNOSIS — E7800 Pure hypercholesterolemia, unspecified: Secondary | ICD-10-CM | POA: Diagnosis not present

## 2021-02-06 DIAGNOSIS — R69 Illness, unspecified: Secondary | ICD-10-CM | POA: Diagnosis not present

## 2021-02-06 DIAGNOSIS — I10 Essential (primary) hypertension: Secondary | ICD-10-CM | POA: Diagnosis not present

## 2021-02-06 DIAGNOSIS — R7301 Impaired fasting glucose: Secondary | ICD-10-CM | POA: Diagnosis not present

## 2021-02-11 DIAGNOSIS — R69 Illness, unspecified: Secondary | ICD-10-CM | POA: Diagnosis not present

## 2021-02-11 DIAGNOSIS — Z6823 Body mass index (BMI) 23.0-23.9, adult: Secondary | ICD-10-CM | POA: Diagnosis not present

## 2021-02-11 DIAGNOSIS — I10 Essential (primary) hypertension: Secondary | ICD-10-CM | POA: Diagnosis not present

## 2021-02-11 DIAGNOSIS — K754 Autoimmune hepatitis: Secondary | ICD-10-CM | POA: Diagnosis not present

## 2021-02-12 DIAGNOSIS — M25561 Pain in right knee: Secondary | ICD-10-CM | POA: Diagnosis not present

## 2021-02-18 DIAGNOSIS — E7800 Pure hypercholesterolemia, unspecified: Secondary | ICD-10-CM | POA: Diagnosis not present

## 2021-02-18 DIAGNOSIS — I10 Essential (primary) hypertension: Secondary | ICD-10-CM | POA: Diagnosis not present

## 2021-03-05 DIAGNOSIS — Z961 Presence of intraocular lens: Secondary | ICD-10-CM | POA: Diagnosis not present

## 2021-03-05 DIAGNOSIS — H2511 Age-related nuclear cataract, right eye: Secondary | ICD-10-CM | POA: Diagnosis not present

## 2021-03-05 DIAGNOSIS — H43812 Vitreous degeneration, left eye: Secondary | ICD-10-CM | POA: Diagnosis not present

## 2021-03-05 DIAGNOSIS — H26492 Other secondary cataract, left eye: Secondary | ICD-10-CM | POA: Diagnosis not present

## 2021-03-12 DIAGNOSIS — Z9842 Cataract extraction status, left eye: Secondary | ICD-10-CM | POA: Diagnosis not present

## 2021-03-28 DIAGNOSIS — Z0100 Encounter for examination of eyes and vision without abnormal findings: Secondary | ICD-10-CM | POA: Diagnosis not present

## 2021-06-09 DIAGNOSIS — M25562 Pain in left knee: Secondary | ICD-10-CM | POA: Diagnosis not present

## 2021-06-09 DIAGNOSIS — S86812A Strain of other muscle(s) and tendon(s) at lower leg level, left leg, initial encounter: Secondary | ICD-10-CM | POA: Diagnosis not present

## 2021-08-04 DIAGNOSIS — R69 Illness, unspecified: Secondary | ICD-10-CM | POA: Diagnosis not present

## 2021-08-04 DIAGNOSIS — I10 Essential (primary) hypertension: Secondary | ICD-10-CM | POA: Diagnosis not present

## 2021-08-04 DIAGNOSIS — K754 Autoimmune hepatitis: Secondary | ICD-10-CM | POA: Diagnosis not present

## 2021-08-30 ENCOUNTER — Inpatient Hospital Stay (HOSPITAL_BASED_OUTPATIENT_CLINIC_OR_DEPARTMENT_OTHER)
Admission: EM | Admit: 2021-08-30 | Discharge: 2021-09-13 | DRG: 234 | Disposition: A | Discharge status: Home / Self Care | Payer: Medicare HMO | Attending: Surgery | Admitting: Surgery

## 2021-08-30 ENCOUNTER — Encounter (HOSPITAL_BASED_OUTPATIENT_CLINIC_OR_DEPARTMENT_OTHER): Payer: Self-pay

## 2021-08-30 ENCOUNTER — Other Ambulatory Visit: Payer: Self-pay

## 2021-08-30 ENCOUNTER — Emergency Department (HOSPITAL_BASED_OUTPATIENT_CLINIC_OR_DEPARTMENT_OTHER): Payer: Medicare HMO | Admitting: Radiology

## 2021-08-30 DIAGNOSIS — R0981 Nasal congestion: Secondary | ICD-10-CM | POA: Diagnosis not present

## 2021-08-30 DIAGNOSIS — K7400 Hepatic fibrosis, unspecified: Secondary | ICD-10-CM | POA: Diagnosis not present

## 2021-08-30 DIAGNOSIS — R001 Bradycardia, unspecified: Secondary | ICD-10-CM | POA: Diagnosis not present

## 2021-08-30 DIAGNOSIS — J9811 Atelectasis: Secondary | ICD-10-CM | POA: Diagnosis not present

## 2021-08-30 DIAGNOSIS — K5900 Constipation, unspecified: Secondary | ICD-10-CM | POA: Diagnosis not present

## 2021-08-30 DIAGNOSIS — I214 Non-ST elevation (NSTEMI) myocardial infarction: Secondary | ICD-10-CM | POA: Diagnosis not present

## 2021-08-30 DIAGNOSIS — E871 Hypo-osmolality and hyponatremia: Secondary | ICD-10-CM | POA: Diagnosis not present

## 2021-08-30 DIAGNOSIS — J90 Pleural effusion, not elsewhere classified: Secondary | ICD-10-CM | POA: Diagnosis not present

## 2021-08-30 DIAGNOSIS — I517 Cardiomegaly: Secondary | ICD-10-CM | POA: Diagnosis not present

## 2021-08-30 DIAGNOSIS — Z720 Tobacco use: Secondary | ICD-10-CM | POA: Diagnosis not present

## 2021-08-30 DIAGNOSIS — Z7982 Long term (current) use of aspirin: Secondary | ICD-10-CM

## 2021-08-30 DIAGNOSIS — Z8249 Family history of ischemic heart disease and other diseases of the circulatory system: Secondary | ICD-10-CM

## 2021-08-30 DIAGNOSIS — F17210 Nicotine dependence, cigarettes, uncomplicated: Secondary | ICD-10-CM | POA: Diagnosis present

## 2021-08-30 DIAGNOSIS — I10 Essential (primary) hypertension: Secondary | ICD-10-CM | POA: Diagnosis present

## 2021-08-30 DIAGNOSIS — D62 Acute posthemorrhagic anemia: Secondary | ICD-10-CM | POA: Diagnosis not present

## 2021-08-30 DIAGNOSIS — E785 Hyperlipidemia, unspecified: Secondary | ICD-10-CM | POA: Diagnosis present

## 2021-08-30 DIAGNOSIS — Z01818 Encounter for other preprocedural examination: Secondary | ICD-10-CM | POA: Diagnosis not present

## 2021-08-30 DIAGNOSIS — K0889 Other specified disorders of teeth and supporting structures: Secondary | ICD-10-CM | POA: Diagnosis present

## 2021-08-30 DIAGNOSIS — I25118 Atherosclerotic heart disease of native coronary artery with other forms of angina pectoris: Secondary | ICD-10-CM | POA: Diagnosis not present

## 2021-08-30 DIAGNOSIS — K754 Autoimmune hepatitis: Secondary | ICD-10-CM | POA: Diagnosis present

## 2021-08-30 DIAGNOSIS — I2510 Atherosclerotic heart disease of native coronary artery without angina pectoris: Secondary | ICD-10-CM | POA: Diagnosis not present

## 2021-08-30 DIAGNOSIS — J939 Pneumothorax, unspecified: Secondary | ICD-10-CM

## 2021-08-30 DIAGNOSIS — D696 Thrombocytopenia, unspecified: Secondary | ICD-10-CM | POA: Diagnosis not present

## 2021-08-30 DIAGNOSIS — I252 Old myocardial infarction: Secondary | ICD-10-CM | POA: Diagnosis not present

## 2021-08-30 DIAGNOSIS — I472 Ventricular tachycardia, unspecified: Secondary | ICD-10-CM | POA: Diagnosis not present

## 2021-08-30 DIAGNOSIS — F419 Anxiety disorder, unspecified: Secondary | ICD-10-CM | POA: Diagnosis present

## 2021-08-30 DIAGNOSIS — K029 Dental caries, unspecified: Secondary | ICD-10-CM | POA: Diagnosis present

## 2021-08-30 DIAGNOSIS — Z01810 Encounter for preprocedural cardiovascular examination: Secondary | ICD-10-CM | POA: Diagnosis not present

## 2021-08-30 DIAGNOSIS — Z79899 Other long term (current) drug therapy: Secondary | ICD-10-CM | POA: Diagnosis not present

## 2021-08-30 DIAGNOSIS — R079 Chest pain, unspecified: Secondary | ICD-10-CM | POA: Diagnosis not present

## 2021-08-30 DIAGNOSIS — I361 Nonrheumatic tricuspid (valve) insufficiency: Secondary | ICD-10-CM | POA: Diagnosis not present

## 2021-08-30 DIAGNOSIS — Z20822 Contact with and (suspected) exposure to covid-19: Secondary | ICD-10-CM | POA: Diagnosis not present

## 2021-08-30 DIAGNOSIS — E8770 Fluid overload, unspecified: Secondary | ICD-10-CM | POA: Diagnosis not present

## 2021-08-30 DIAGNOSIS — R918 Other nonspecific abnormal finding of lung field: Secondary | ICD-10-CM | POA: Diagnosis not present

## 2021-08-30 DIAGNOSIS — K759 Inflammatory liver disease, unspecified: Secondary | ICD-10-CM | POA: Diagnosis not present

## 2021-08-30 DIAGNOSIS — I6529 Occlusion and stenosis of unspecified carotid artery: Secondary | ICD-10-CM | POA: Diagnosis not present

## 2021-08-30 DIAGNOSIS — Z951 Presence of aortocoronary bypass graft: Secondary | ICD-10-CM

## 2021-08-30 DIAGNOSIS — I25110 Atherosclerotic heart disease of native coronary artery with unstable angina pectoris: Secondary | ICD-10-CM | POA: Diagnosis not present

## 2021-08-30 HISTORY — DX: Anxiety disorder, unspecified: F41.9

## 2021-08-30 HISTORY — DX: Inflammatory liver disease, unspecified: K75.9

## 2021-08-30 LAB — CBC
HCT: 45.3 % (ref 36.0–46.0)
Hemoglobin: 15.5 g/dL — ABNORMAL HIGH (ref 12.0–15.0)
MCH: 31.6 pg (ref 26.0–34.0)
MCHC: 34.2 g/dL (ref 30.0–36.0)
MCV: 92.4 fL (ref 80.0–100.0)
Platelets: 224 10*3/uL (ref 150–400)
RBC: 4.9 MIL/uL (ref 3.87–5.11)
RDW: 13.2 % (ref 11.5–15.5)
WBC: 9.4 10*3/uL (ref 4.0–10.5)
nRBC: 0 % (ref 0.0–0.2)

## 2021-08-30 LAB — PROTIME-INR
INR: 0.9 (ref 0.8–1.2)
Prothrombin Time: 12.1 seconds (ref 11.4–15.2)

## 2021-08-30 LAB — BASIC METABOLIC PANEL
Anion gap: 11 (ref 5–15)
BUN: 21 mg/dL (ref 8–23)
CO2: 21 mmol/L — ABNORMAL LOW (ref 22–32)
Calcium: 10.4 mg/dL — ABNORMAL HIGH (ref 8.9–10.3)
Chloride: 103 mmol/L (ref 98–111)
Creatinine, Ser: 0.78 mg/dL (ref 0.44–1.00)
GFR, Estimated: >60 mL/min (ref >60)
Glucose, Bld: 113 mg/dL — ABNORMAL HIGH (ref 70–99)
Potassium: 3.5 mmol/L (ref 3.5–5.1)
Sodium: 135 mmol/L (ref 135–145)

## 2021-08-30 LAB — TROPONIN I (HIGH SENSITIVITY)
Troponin I (High Sensitivity): 1217 ng/L (ref <18)
Troponin I (High Sensitivity): 1359 ng/L (ref <18)

## 2021-08-30 LAB — RESP PANEL BY RT-PCR (FLU A&B, COVID) ARPGX2
Influenza A by PCR: NEGATIVE
Influenza B by PCR: NEGATIVE
SARS Coronavirus 2 by RT PCR: NEGATIVE

## 2021-08-30 LAB — HEPARIN LEVEL (UNFRACTIONATED): Heparin Unfractionated: 0.17 IU/mL — ABNORMAL LOW (ref 0.30–0.70)

## 2021-08-30 MED ORDER — ONDANSETRON HCL 4 MG/2ML IJ SOLN: 4.0000 mg | Freq: Four times a day (QID) | INTRAMUSCULAR | Status: DC | PRN

## 2021-08-30 MED ORDER — METOPROLOL SUCCINATE ER 50 MG PO TB24
50.0000 mg | ORAL_TABLET | Freq: Every day | ORAL | Status: DC
  Administered 2021-08-31 – 2021-09-07 (×8): 50 mg via ORAL
  Filled 2021-08-30 (×8): qty 1

## 2021-08-30 MED ORDER — ASPIRIN EC 81 MG PO TBEC
81.0000 mg | DELAYED_RELEASE_TABLET | Freq: Every day | ORAL | Status: DC
  Administered 2021-08-31 – 2021-09-07 (×8): 81 mg via ORAL
  Filled 2021-08-30 (×8): qty 1

## 2021-08-30 MED ORDER — ACETAMINOPHEN 325 MG PO TABS: 650.0000 mg | ORAL_TABLET | ORAL | Status: DC | PRN

## 2021-08-30 MED ORDER — AZATHIOPRINE 50 MG PO TABS
75.0000 mg | ORAL_TABLET | Freq: Every day | ORAL | Status: DC
  Administered 2021-08-31 – 2021-09-01 (×2): 75 mg via ORAL
  Filled 2021-08-30 (×2): qty 2

## 2021-08-30 MED ORDER — ASPIRIN 325 MG PO TABS
325.0000 mg | ORAL_TABLET | Freq: Every day | ORAL | Status: DC
  Administered 2021-08-30: 325 mg via ORAL
  Filled 2021-08-30: qty 1

## 2021-08-30 MED ORDER — NITROGLYCERIN 0.4 MG SL SUBL
0.4000 mg | SUBLINGUAL_TABLET | SUBLINGUAL | Status: DC | PRN
  Filled 2021-08-30: qty 1

## 2021-08-30 MED ORDER — NITROGLYCERIN IN D5W 200-5 MCG/ML-% IV SOLN
0.0000 ug/min | INTRAVENOUS | Status: DC
Start: 2021-08-30 — End: 2021-09-01
  Administered 2021-08-30: 5 ug/min via INTRAVENOUS
  Filled 2021-08-30: qty 250

## 2021-08-30 MED ORDER — HEPARIN SODIUM (PORCINE) 5000 UNIT/ML IJ SOLN
60.0000 [IU]/kg | Freq: Once | INTRAMUSCULAR | Status: AC
  Administered 2021-08-30: 3400 [IU] via INTRAVENOUS
  Filled 2021-08-30: qty 1

## 2021-08-30 MED ORDER — HEPARIN (PORCINE) 25000 UT/250ML-% IV SOLN
850.0000 [IU]/h | INTRAVENOUS | Status: DC
  Administered 2021-08-30: 700 [IU]/h via INTRAVENOUS
  Filled 2021-08-30 (×3): qty 250

## 2021-08-30 NOTE — Progress Notes (Signed)
ANTICOAGULATION CONSULT NOTE - Initial Consult  Pharmacy Consult for Heparin Indication: chest pain/ACS  No Known Allergies  Patient Measurements: Height: 5\' 3"  (160 cm) Weight: 56.7 kg (125 lb) IBW/kg (Calculated) : 52.4 Heparin Dosing Weight: 56.7 kg  Vital Signs: Temp: 98.2 F (36.8 C) (12/03 1135) BP: 218/91 (12/03 1416) Pulse Rate: 62 (12/03 1416)  Labs: Recent Labs    08/30/21 1202  HGB 15.5*  HCT 45.3  PLT 224  CREATININE 0.78  TROPONINIHS 1,217*    Estimated Creatinine Clearance: 51 mL/min (by C-G formula based on SCr of 0.78 mg/dL).   Medical History: History reviewed. No pertinent past medical history.  Medications:  (Not in a hospital admission)  Scheduled:   aspirin  325 mg Oral Daily   Infusions:   heparin     PRN: nitroGLYCERIN  Assessment: 75 yof presenting with chest pain. Heparin per pharmacy consult placed for chest pain/ACS.  Patient is on not on anticoagulation prior to arrival.  Hgb 15.5; plt 224  Goal of Therapy:  Heparin level 0.3-0.7 units/ml Monitor platelets by anticoagulation protocol: Yes   Plan:  3400 unit IV heparin bolus already given at time of heparin consult Start heparin infusion at 700 units/hr Check anti-Xa level at 2100 and daily while on heparin Continue to monitor H&H and platelets  14/03/22, PharmD, BCPS 08/30/2021 2:25 PM ED Clinical Pharmacist -  [phone]

## 2021-08-30 NOTE — H&P (Addendum)
Cardiology Admission History and Physical:   Patient ID: [name] [name] MRN: [id number]; DOB: 18-Jul-1946   Admission date: 08/30/2021  Primary Care Provider: Juluis Rainier, MD (Inactive) [facility] Cardiologist: None  [facility] Electrophysiologist:  None   Chief Complaint: chest burning   Patient Profile:   [name] [name] is a 75 y.o. female with HTN, prior AI hepatitis and tobacco use who is transferred from [facility] ED for NSTEMI management.   History of Present Illness:   [name] [name] reports chest pain which has progressively worsened over the past week and usually occurs twice a day at the park.  She walks 2-3 miles in a park without any issues in the morning however when she goes back in the afternoon she often has central, burning, chest pain with radiation to her jaw which last for 10 to 15 minutes but denies any associated nausea, diaphoresis or SOB/DOE.  She often takes Tums which seemed to help but pain however over the past week she also had pain in the morning which was unusual for her.  Day prior to admission she had off-and-on pain even without exertion which was also unusual for her.  She did feel that her heart was pounding more than normal but denied any other palpitations.  She denied any orthopnea, PND, lower extremity swelling, or SOB/DOE.   No recurrent CP since 12/02 at 2000. Lasted 1030-2000 on 12/02.   VS on ED eval P 80, BP 163/87, T 98.2, RR 15, O2 98% on RA hsT (1217->1359)  ECG (08/30/21, 11:39:43) with NSR 75, PR 160, QRS 76, Qtc 437, aVR elevation, <0.5 mm anterolateral ST depressions  hsT (1217->1359)   Of note she is followed by [facility] for autoimmune hepatitis with stage II fibrosis and it is on azathioprine 75 mg daily with recent lab work showing normalization of her transaminitis.  She initially was treated with budesonide which improved her liver function test however they never completely returned to normal so she was transitioned to  the ACT in 2020.  During my evaluation   Per patient report: Early November was walking (usually does 2 miles AM/2 miles PM). Went out in afternoon and had "burning sensation" in chest during afternoon walk. She thought it was cold air because it was the first day it cold outside. She kept walking and still having sx at 0.5 miles. Took Tums and sx resolved. Thought it was GERD since it resolved with TUMS. Every day for a month the same thing happened each day, only during afternoon, only when out in the cold. She walks through it and take deep breaths and eventually would go away. This week she had it happen Monday afternoon like usual. Then Tuesday it didn't happen. Wednesday afternoon recurred. Recurred twice that evening which was atypical for her. She was wrapping Christmas presents and bending down, moving about doing this. Drank some water and seemed to go away. Most episodes 10-15 minutes. Thursday recurred with afternoon walk. Never occurs in the morning. Went Friday morning and when she got out of car and barely started walking it started (usually takes ~500 yards to happen). Took TUMS and didn't improve this time. 1030 AM til 2000 off/on occurred. Only occurred when she did something (walked to rest room). Took ~15 TUMS throughout the day (takes 1-2 daily over the past month but prior to this month very occasional for "indigestion" where she felt reflux in her throat, didn't take on regular basis). Spoke to brother who recommended she  be evaluated. She didn't want to come in 12/02 PM. This morning (12/03) she felt generally weak (although L leg has been weak since she injured her L leg 3 mo ago). She overall just didn't feel great this morning. No chest pain on waking up this morning, none since Friday evening (12/02).   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  Retired now for 3 years from [employer].  Currently smokes 1.5 ppd, 50+ ppd.   HTN (180s/110s), when not stressed  140-150 at home.  Toprol XL 50 mg for HTN, doubled recently and said she had "very low BP". Takes 1 qhs and 0.5 in AM. 50qhs/25   Never been on ACEi/ARB  No DM2 Father died of CM? And died at 10 of MI  Heart attack at 59 and 54   Mother was 74 when she passed No heart attacks prior  ? Unknown heart issues but reportedly had some mild CP later in life for which she got [facility]   Past Medical History:  Diagnosis Date   Anxiety    Hepatitis     History reviewed. No pertinent surgical history.   Medications Prior to Admission: Prior to Admission medications   Medication Sig Start Date End Date Taking? Authorizing Provider  metoprolol succinate (TOPROL-XL) 50 MG 24 hr tablet Take 50 mg by mouth daily. Take with or immediately following a meal.   Yes [provider]    Allergies:   No Known Allergies  Social History:   Social History   Socioeconomic History   Marital status: Single    Spouse name: Not on file   Number of children: Not on file   Years of education: Not on file   Highest education level: Not on file  Occupational History   Not on file  Tobacco Use   Smoking status: Every Day    Packs/day: 1.50    Years: 30.00    Pack years: 45.00    Types: Cigarettes   Smokeless tobacco: Never  Substance and Sexual Activity   Alcohol use: Yes   Drug use: Never   Sexual activity: Not on file  Other Topics Concern   Not on file  Social History Narrative   Not on file   Social Determinants of Health   Financial Resource Strain: Not on file  Food Insecurity: Not on file  Transportation Needs: Not on file  Physical Activity: Not on file  Stress: Not on file  Social Connections: Not on file  Intimate Partner Violence: Not on file    Family History:   The patient's family history includes Premature CHD in her father.    ROS:   Review of Systems: [y] = yes, [ ]  = no      General: Weight gain [ ] ; Weight loss [ ] ; Anorexia [ ] ; Fatigue [ ] ; Fever [ ] ;  Chills [ ] ; Weakness [ ]    Cardiac: Chest pain/pressure [y]; Resting SOB [ ] ; Exertional SOB [y]; Orthopnea [ ] ; Pedal Edema [ ] ; Palpitations [ ] ; Syncope [ ] ; Presyncope [ ] ; Paroxysmal nocturnal dyspnea [ ]    Pulmonary: Cough [ ] ; Wheezing [ ] ; Hemoptysis [ ] ; Sputum [ ] ; Snoring [ ]   GI: Vomiting [ ] ; Dysphagia [ ] ; Melena [ ] ; Hematochezia [ ] ; Heartburn [ ] ; Abdominal pain [ ] ; Constipation [ ] ; Diarrhea [ ] ; BRBPR [ ]    GU: Hematuria [ ] ; Dysuria [ ] ; Nocturia [ ]  Vascular: Pain in legs with  walking [ ] ; Pain in feet with lying flat [ ] ; Non-healing sores [ ] ; Stroke [ ] ; TIA [ ] ; Slurred speech [ ] ;   Neuro: Headaches [ ] ; Vertigo [ ] ; Seizures [ ] ; Paresthesias [ ] ;Blurred vision [ ] ; Diplopia [ ] ; Vision changes [ ]    Ortho/Skin: Arthritis [ ] ; Joint pain [ ] ; Muscle pain [ ] ; Joint swelling [ ] ; Back Pain [ ] ; Rash [ ]    Psych: Depression [ ] ; Anxiety [ ]    Heme: Bleeding problems [ ] ; Clotting disorders [ ] ; Anemia [ ]    Endocrine: Diabetes [ ] ; Thyroid dysfunction [ ]    Physical Exam/Data:   Vitals:   08/30/21 1915 08/30/21 2000 08/30/21 2100 08/30/21 2333  BP: (!) 149/113 (!) 146/126 136/89 (!) 149/86  Pulse: 71 73 73 61  Resp: 20 18 20 18   Temp:  97.6 F (36.4 C)  (!) 97.5 F (36.4 C)  TempSrc:  Oral  Oral  SpO2: 96% 97% 96% 96%  Weight:      Height:        Intake/Output Summary (Last 24 hours) at 08/31/2021 0118 Last data filed at 08/30/2021 1900 Gross per 24 hour  Intake 6.57 ml  Output --  Net 6.57 ml   Last 3 Weights 08/30/2021 06/02/2018  Weight (lbs) 125 lb 125 lb  Weight (kg) 56.7 kg 56.7 kg     Body mass index is 22.14 kg/m.  General:  Well nourished, well developed, in no acute distress HEENT: normal Lymph: no adenopathy Neck: no JVD Endocrine:  No thryomegaly Vascular: No carotid bruits; FA pulses 2+ bilaterally without bruits  Cardiac:  normal S1, S2; RRR; no murmur  Lungs:  clear to auscultation bilaterally, no wheezing, rhonchi or rales  Abd:  soft, nontender, no hepatomegaly  Ext: no edema Musculoskeletal:  No deformities, BUE and BLE strength normal and equal Skin: warm and dry  Neuro:  CNs 2-12 intact, no focal abnormalities noted Psych:  Normal affect   EKG:  The ECG that was done 08/30/21 (11:39:43) was personally reviewed and demonstrates NSR 75, PR 160, QRS 76, Qtc 437, no ischemic changes   Relevant CV Studies: None   Laboratory Data:  High Sensitivity Troponin:   Recent Labs  Lab 08/30/21 1202 08/30/21 1410  TROPONINIHS 1,217* 1,359*      Chemistry Recent Labs  Lab 08/30/21 1202  NA 135  K 3.5  CL 103  CO2 21*  GLUCOSE 113*  BUN 21  CREATININE 0.78  CALCIUM 10.4*  GFRNONAA >60  ANIONGAP 11    No results for input(s): PROT, ALBUMIN, AST, ALT, ALKPHOS, BILITOT in the last 168 hours.  Hematology Recent Labs  Lab 08/30/21 1202  WBC 9.4  RBC 4.90  HGB 15.5*  HCT 45.3  MCV 92.4  MCH 31.6  MCHC 34.2  RDW 13.2  PLT 224   BNPNo results for input(s): BNP, PROBNP in the last 168 hours.  DDimer No results for input(s): DDIMER in the last 168 hours.  Radiology/Studies:  DG Chest 2 View  Result Date: 08/30/2021 CLINICAL DATA:  Chest pain, intermittent chest pain to the last month, more severe in frequent yesterday and today. EXAM: CHEST - 2 VIEW COMPARISON:  None FINDINGS: EKG leads project over the chest. Trachea midline. Cardiomediastinal contours and hilar structures are normal. No sign of lobar consolidative process. No visible pneumothorax. On limited assessment there is no acute skeletal process. IMPRESSION: No acute cardiopulmonary disease. Electronically Signed   By: Zetta Bills  M.D.   On: 08/30/2021 13:07         :[id number]   TIMI Risk Score for Unstable Angina or Non-ST Elevation MI:   The patient's TIMI risk score is 4, which indicates a 20% risk of all cause mortality, new or recurrent myocardial infarction or need for urgent revascularization in the next 14 days.{  Assessment and  Plan:   NSTEMI  [name] [name] has a month-[place] history of exertional symptoms that have progressed with decreased exertion.  Usually the symptoms occur after walking at least 500 yards however this week is starting her just getting out of a car and going to the restroom at home.  She has never had rest symptoms and she can often go on [place] walks without any symptoms at all.  Her troponin was significantly elevated on ED evaluation consistent with NSTEMI.  Risk factors are uncontrolled HTN, ongoing tobacco use along history of tobacco use, and premature CAD in her father (MI at 40 and 50).  She is extremely anxious about being in hospital but understands the general plan going forth with echo today and likely coronary angiography tomorrow. - continue ASA 81 mg daily - continue home toprol XL (50 mg qAM), hold PM 25 mg dose (on it for BP control, should be on ACEi/ARB - start ACEi/ARB post cath  - not on statin however has AI hepatitis that has resolved but is still on tx that likely will be stopped at her f/u clinic appointment with [facility] this month, for now would hold off on statin as she had liver fibrosis on bx  - continue heparin gtt - continue nitro gtt for sx relief and BP control, only on 5 mcg currently  - TTE pending  - lipids/A1c/TSH pending   HTN - continue toprol as above, add ACEi post cath  AI hepatitis - continue azathioprine 75 mg daily   Tooth decay - has partially decayed tooth in L maxilla - planned to remove this week (Monday), will have to consider this with likely need for DAPT following cath  - has been on amoxicillin to decrease inflammation/infection for 1.5 weeks   Severity of Illness: The appropriate patient status for this patient is INPATIENT. Inpatient status is judged to be reasonable and necessary in order to provide the required intensity of service to ensure the patient's safety. The patient's presenting symptoms, physical exam findings, and initial radiographic  and laboratory data in the context of their chronic comorbidities is felt to place them at high risk for further clinical deterioration. Furthermore, it is not anticipated that the patient will be medically stable for discharge from the hospital within 2 midnights of admission.   * I certify that at the point of admission it is my clinical judgment that the patient will require inpatient hospital care spanning beyond 2 midnights from the point of admission due to high intensity of service, high risk for further deterioration and high frequency of surveillance required.*   For questions or updates, please contact [place] Please consult [email or web address] for contact info under   Signed, Dion Body, MD  08/31/2021 1:18 AM

## 2021-08-30 NOTE — ED Notes (Signed)
Report called to care link 

## 2021-08-30 NOTE — Progress Notes (Signed)
Pt arrived to 4e from [facility]. Telemetry box placed and CCMD notified x2 verifiers. Vitals obtained. Pt currently chest pain free. Nitro and heparin drips infusing. Pt oriented to room and staff. CHG bath done. Cardiology notified of pt's arrival.

## 2021-08-30 NOTE — ED Notes (Signed)
PA notified of lab values. 2nd EKG shot. Pt denis pain and states that she has not had any pain since 2000 yesterday. Pt described pain yesterday as intermittent and burning pain that lasted yesterday from 1030 -2000 hrs. Pt states she feels ok at this time

## 2021-08-30 NOTE — ED Provider Notes (Signed)
[facility] EMERGENCY DEPT Provider Note   CSN: [id number] Arrival date & time: 08/30/21  1125     History Chief Complaint  Patient presents with   Chest Pain    [name] [name] is a 75 y.o. female.  With past medical history of hypertension who presents emergency department with chest pain.  She states for 1 month she has had 1 episode of chest pain today which is progressively worsened particularly over the past week.  She states that she walks twice a day at the park.  She states that in the morning she walks between 2 to 3 miles with no issues.  She then goes back to the park in the evening to exercise and she states that consistently when she gets out of the car for her second walk she begins to have central, burning, chest pain that radiates to her jaw.  She states that last for about 10 to 15 minutes.  She denies any nausea, diaphoresis or shortness of breath with the episodes.  She states that she will routinely take Tums which intermittently seem to alleviate the pain.  However she states over the past week she has had pain in the morning which is unusual for her, and then yesterday she had the pain "off and on" all day even without exertion.  She does report intermittently feeling like her heart is pounding.  He denies any fevers, lower extremity swelling, lightheadedness or dizziness.    Chest Pain Associated symptoms: palpitations   Associated symptoms: no abdominal pain, no cough, no diaphoresis, no dizziness, no fever, no nausea, no shortness of breath and no vomiting       History reviewed. No pertinent past medical history.  There are no problems to display for this patient.   History reviewed. No pertinent surgical history.   OB History   No obstetric history on file.     No family history on file.     Home Medications Prior to Admission medications   Medication Sig Start Date End Date Taking? Authorizing Provider  metoprolol succinate  (TOPROL-XL) 50 MG 24 hr tablet Take 50 mg by mouth daily. Take with or immediately following a meal.    [provider]    Allergies    Patient has no known allergies.  Review of Systems   Review of Systems  Constitutional:  Negative for diaphoresis and fever.  Respiratory:  Negative for cough and shortness of breath.   Cardiovascular:  Positive for chest pain and palpitations. Negative for leg swelling.  Gastrointestinal:  Negative for abdominal pain, nausea and vomiting.  Neurological:  Negative for dizziness and light-headedness.  All other systems reviewed and are negative.  Physical Exam Updated Vital Signs BP (!) 163/87   Pulse 80   Temp 98.2 F (36.8 C)   Resp 15   Ht 5\' 3"  (1.6 m)   Wt 56.7 kg   SpO2 98%   BMI 22.14 kg/m   Physical Exam Vitals and nursing note reviewed.  Constitutional:      General: She is not in acute distress.    Appearance: Normal appearance. She is well-developed and normal weight. She is not toxic-appearing.  HENT:     Head: Normocephalic and atraumatic.     Mouth/Throat:     Mouth: Mucous membranes are moist.     Pharynx: Oropharynx is clear.  Eyes:     General: No scleral icterus.    Pupils: Pupils are equal, round, and reactive to light.  Neck:     Vascular: No JVD.  Cardiovascular:     Rate and Rhythm: Normal rate and regular rhythm.     Pulses:          Radial pulses are 2+ on the right side and 2+ on the left side.     Heart sounds: Normal heart sounds. No murmur heard. Pulmonary:     Effort: Pulmonary effort is normal. No respiratory distress.     Breath sounds: Normal breath sounds.  Chest:     Chest wall: No tenderness.  Abdominal:     General: Bowel sounds are normal.     Palpations: Abdomen is soft.  Musculoskeletal:        General: Normal range of motion.     Cervical back: Normal range of motion.     Right lower leg: No tenderness. No edema.     Left lower leg: No tenderness. No edema.  Skin:     General: Skin is warm and dry.     Capillary Refill: Capillary refill takes less than 2 seconds.  Neurological:     General: No focal deficit present.     Mental Status: She is alert and oriented to person, place, and time. Mental status is at baseline.  Psychiatric:        Mood and Affect: Mood normal.        Behavior: Behavior normal.    ED Results / Procedures / Treatments   Labs (all labs ordered are listed, but only abnormal results are displayed) Labs Reviewed  BASIC METABOLIC PANEL - Abnormal; Notable for the following components:      Result Value   CO2 21 (*)    Glucose, Bld 113 (*)    Calcium 10.4 (*)    All other components within normal limits  CBC - Abnormal; Notable for the following components:   Hemoglobin 15.5 (*)    All other components within normal limits  TROPONIN I (HIGH SENSITIVITY) - Abnormal; Notable for the following components:   Troponin I (High Sensitivity) 1,217 (*)    All other components within normal limits  TROPONIN I (HIGH SENSITIVITY) - Abnormal; Notable for the following components:   Troponin I (High Sensitivity) 1,359 (*)    All other components within normal limits  RESP PANEL BY RT-PCR (FLU A&B, COVID) [id number]  PROTIME-INR  HEPARIN LEVEL (UNFRACTIONATED)  HEPARIN LEVEL (UNFRACTIONATED)  BASIC METABOLIC PANEL  CBC   EKG EKG Interpretation  Date/Time:  Saturday August 30 2021 12:58:07 EST Ventricular Rate:  67 PR Interval:  177 QRS Duration: 77 QT Interval:  418 QTC Calculation: 442 R Axis:   -7 Text Interpretation: Sinus rhythm Probable left atrial enlargement Minimal ST depression, anterolateral leads Confirmed by Margarita Grizzle [phone]) on 08/30/2021 1:00:27 PM  Radiology DG Chest 2 View  Result Date: 08/30/2021 CLINICAL DATA:  Chest pain, intermittent chest pain to the last month, more severe in frequent yesterday and today. EXAM: CHEST - 2 VIEW COMPARISON:  None FINDINGS: EKG leads project over the chest. Trachea midline.  Cardiomediastinal contours and hilar structures are normal. No sign of lobar consolidative process. No visible pneumothorax. On limited assessment there is no acute skeletal process. IMPRESSION: No acute cardiopulmonary disease. Electronically Signed   By: Donzetta Kohut M.D.   On: 08/30/2021 13:07    Procedures .Critical Care Performed by: Cristopher Peru, PA-C Authorized by: Cristopher Peru, PA-C   Critical care provider statement:    Critical care time (minutes):  40  Critical care time was exclusive of:  Separately billable procedures and treating other patients   Critical care was necessary to treat or prevent imminent or life-threatening deterioration of the following conditions:  Cardiac failure   Critical care was time spent personally by me on the following activities:  Development of treatment plan with patient or surrogate, discussions with consultants, discussions with primary provider, evaluation of patient's response to treatment, examination of patient, obtaining history from patient or surrogate, review of old charts, re-evaluation of patient's condition, pulse oximetry, ordering and review of radiographic studies, ordering and review of laboratory studies and ordering and performing treatments and interventions   I assumed direction of critical care for this patient from another provider in my specialty: no     Care discussed with: admitting provider     Medications Ordered in ED Medications  aspirin tablet 325 mg (325 mg Oral Given 08/30/21 1305)  nitroGLYCERIN (NITROSTAT) SL tablet 0.4 mg (has no administration in time range)  heparin ADULT infusion 100 units/mL (25000 units/244mL) (700 Units/hr Intravenous New Bag/Given 08/30/21 1452)  nitroGLYCERIN 50 mg in dextrose 5 % 250 mL (0.2 mg/mL) infusion (5 mcg/min Intravenous Rate/Dose Change 08/30/21 1535)  aspirin EC tablet 81 mg (has no administration in time range)  acetaminophen (TYLENOL) tablet 650 mg (has no administration  in time range)  ondansetron (ZOFRAN) injection 4 mg (has no administration in time range)  heparin injection 3,400 Units (3,400 Units Intravenous Given 08/30/21 1312)    ED Course  I have reviewed the triage vital signs and the nursing notes.  Pertinent labs & imaging results that were available during my care of the patient were reviewed by me and considered in my medical decision making (see chart for details).  1401: Spoke with Laurance Flatten, MD with [facility] who agrees to admit the patient to cardiology service at [facility].  She also requests heparin drip, and beta-blocker versus nitro drip to control systolic BP. MDM Rules/Calculators/A&P 75 year old female who presents to the emergency department with chest pain.  EKG with minimal ST depressions in anterolateral leads.   Initial troponin 1270 seen, repeat 1359, consistent with NSTEMI.  Repeat EKG at time of second troponin without change. Given aspirin 325 mg, bolus of heparin followed by heparin infusion. Given nitroglycerin drip for blood pressure control as she is hypertensive here in the emergency department. No evidence of cardiac tamponade, no JVD, hypotension or tachycardia present.  She is hemodynamically stable.  She is having no acute chest pain here in the emergency department.  I have spoken with Laurance Flatten, cardiologist at [facility] who agrees to admit the patient to cardiology service. -COVID, flu negative Chest x-ray without pneumonia or pneumothorax  She is being transferred to [facility] under cardiology service.  At time of transfer patient is hemodynamically stable and safe for transfer.  She verbalizes understanding for need of admission at this time. Final Clinical Impression(s) / ED Diagnoses Final diagnoses:  NSTEMI (non-ST elevated myocardial infarction) [facility])    Rx / DC Orders ED Discharge Orders     None        Cristopher Peru, PA-C 08/30/21 1818    Margarita Grizzle, MD 09/02/21  1215

## 2021-08-30 NOTE — Progress Notes (Signed)
ANTICOAGULATION CONSULT NOTE   Pharmacy Consult for Heparin Indication: chest pain/ACS  No Known Allergies  Patient Measurements: Height: 5\' 3"  (160 cm) Weight: 56.7 kg (125 lb) IBW/kg (Calculated) : 52.4 Heparin Dosing Weight: 56.7 kg  Vital Signs: Temp: 97.6 F (36.4 C) (12/03 2000) Temp Source: Oral (12/03 2000) BP: 136/89 (12/03 2100) Pulse Rate: 73 (12/03 2100)  Labs: Recent Labs    08/30/21 1202 08/30/21 1410 08/30/21 2152  HGB 15.5*  --   --   HCT 45.3  --   --   PLT 224  --   --   LABPROT 12.1  --   --   INR 0.9  --   --   HEPARINUNFRC  --   --  0.17*  CREATININE 0.78  --   --   TROPONINIHS 1,217* 1,359*  --      Estimated Creatinine Clearance: 51 mL/min (by C-G formula based on SCr of 0.78 mg/dL).   Medical History: History reviewed. No pertinent past medical history.  Medications:  Medications Prior to Admission  Medication Sig Dispense Refill Last Dose   metoprolol succinate (TOPROL-XL) 50 MG 24 hr tablet Take 50 mg by mouth daily. Take with or immediately following a meal.   08/30/2021    Scheduled:   [START ON 08/31/2021] aspirin EC  81 mg Oral Daily   [START ON 08/31/2021] azaTHIOprine  75 mg Oral Daily   [START ON 08/31/2021] metoprolol succinate  50 mg Oral Daily   Infusions:   heparin 700 Units/hr (08/30/21 1452)   nitroGLYCERIN 5 mcg/min (08/30/21 1900)   PRN: acetaminophen, nitroGLYCERIN, ondansetron (ZOFRAN) IV  Assessment: 75 yof presenting with chest pain. Heparin per pharmacy consult placed for chest pain/ACS.  Patient is on not on anticoagulation prior to arrival.  Hgb 15.5; plt 224  Heparin level this evening is below goal at 0.17.  No known issues with IV infusion, no overt bleeding or complications noted.  Goal of Therapy:  Heparin level 0.3-0.7 units/ml Monitor platelets by anticoagulation protocol: Yes   Plan:  Increase IV heparin to 850 units/hr. Repeat heparin level in 8 hrs then daily. Continue to monitor H&H and  platelets  14/03/22, Reece Leader, BCCP Clinical Pharmacist  08/30/2021 10:38 PM   [facility] pharmacy phone numbers are listed on [email or web address]

## 2021-08-30 NOTE — ED Notes (Signed)
Pt continues to hold phone and block Nitro drip. P

## 2021-08-30 NOTE — ED Notes (Signed)
Reassessing pt's BP

## 2021-08-30 NOTE — ED Notes (Signed)
Pt gets emotional every time she gets on the phone. This RN was able to re-assure pt and calm down. Pt agreed to get off phone and try to relax.

## 2021-08-30 NOTE — ED Triage Notes (Signed)
Patient here POV from Home with CP.  Yesterday AM the Patient began having CP.  Patient had been having Intermittent CP throughout the last Month but yesterday and today was more severe and frequent. Tums have been mildly/moderately effective at treating symptoms. Patient believes it it the Coldness in [employer].  NAD Noted during Triage. A&Ox4. GCS 15. BIB Wheelchair. No SOB. No N/V.

## 2021-08-30 NOTE — ED Notes (Signed)
Pt  stated that when she gets nervous and upset and is not unusual for her to get around 210 systolic. Pt admitted to being nervous but is not distressed.

## 2021-08-30 NOTE — ED Notes (Signed)
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 Pt stated that she thinks she is allergic to a form of cortico steroids but is unsure of the name of the medication or the exact medication

## 2021-08-31 ENCOUNTER — Inpatient Hospital Stay (HOSPITAL_COMMUNITY): Payer: Medicare HMO

## 2021-08-31 DIAGNOSIS — I214 Non-ST elevation (NSTEMI) myocardial infarction: Secondary | ICD-10-CM | POA: Diagnosis not present

## 2021-08-31 LAB — BASIC METABOLIC PANEL
Anion gap: 10 (ref 5–15)
BUN: 18 mg/dL (ref 8–23)
CO2: 22 mmol/L (ref 22–32)
Calcium: 8.9 mg/dL (ref 8.9–10.3)
Chloride: 103 mmol/L (ref 98–111)
Creatinine, Ser: 0.77 mg/dL (ref 0.44–1.00)
GFR, Estimated: >60 mL/min (ref >60)
Glucose, Bld: 104 mg/dL — ABNORMAL HIGH (ref 70–99)
Potassium: 3.3 mmol/L — ABNORMAL LOW (ref 3.5–5.1)
Sodium: 135 mmol/L (ref 135–145)

## 2021-08-31 LAB — ECHOCARDIOGRAM COMPLETE
Area-P 1/2: 2.99 cm2
Height: 63 in
S' Lateral: 2.3 cm
Weight: 2000.01 oz

## 2021-08-31 LAB — CBC
HCT: 42.2 % (ref 36.0–46.0)
Hemoglobin: 14.8 g/dL (ref 12.0–15.0)
MCH: 32.6 pg (ref 26.0–34.0)
MCHC: 35.1 g/dL (ref 30.0–36.0)
MCV: 93 fL (ref 80.0–100.0)
Platelets: 184 10*3/uL (ref 150–400)
RBC: 4.54 MIL/uL (ref 3.87–5.11)
RDW: 13.2 % (ref 11.5–15.5)
WBC: 6.7 10*3/uL (ref 4.0–10.5)
nRBC: 0 % (ref 0.0–0.2)

## 2021-08-31 LAB — LIPID PANEL
Cholesterol: 205 mg/dL — ABNORMAL HIGH (ref 0–200)
HDL: 49 mg/dL (ref >40)
LDL Cholesterol: 145 mg/dL — ABNORMAL HIGH (ref 0–99)
Total CHOL/HDL Ratio: 4.2 RATIO
Triglycerides: 54 mg/dL (ref <150)
VLDL: 11 mg/dL (ref 0–40)

## 2021-08-31 LAB — HEPARIN LEVEL (UNFRACTIONATED)
Heparin Unfractionated: 0.37 IU/mL (ref 0.30–0.70)
Heparin Unfractionated: 0.35 IU/mL (ref 0.30–0.70)

## 2021-08-31 LAB — TROPONIN I (HIGH SENSITIVITY): Troponin I (High Sensitivity): 1120 ng/L (ref <18)

## 2021-08-31 LAB — TSH: TSH: 4.625 u[IU]/mL — ABNORMAL HIGH (ref 0.350–4.500)

## 2021-08-31 LAB — HEMOGLOBIN A1C
Hgb A1c MFr Bld: 5.5 % (ref 4.8–5.6)
Mean Plasma Glucose: 111.15 mg/dL

## 2021-08-31 MED ORDER — SODIUM CHLORIDE 0.9% FLUSH
3.0000 mL | Freq: Two times a day (BID) | INTRAVENOUS | Status: DC
  Administered 2021-09-01: 3 mL via INTRAVENOUS

## 2021-08-31 MED ORDER — SODIUM CHLORIDE 0.9 % WEIGHT BASED INFUSION
3.0000 mL/kg/h | INTRAVENOUS | Status: DC
  Administered 2021-09-01: 3 mL/kg/h via INTRAVENOUS

## 2021-08-31 MED ORDER — POTASSIUM CHLORIDE CRYS ER 20 MEQ PO TBCR
40.0000 meq | EXTENDED_RELEASE_TABLET | Freq: Once | ORAL | Status: AC
  Administered 2021-08-31: 12:00:00 40 meq via ORAL
  Filled 2021-08-31: qty 2

## 2021-08-31 MED ORDER — SODIUM CHLORIDE 0.9 % WEIGHT BASED INFUSION
1.0000 mL/kg/h | INTRAVENOUS | Status: DC
  Administered 2021-09-01: 1 mL/kg/h via INTRAVENOUS

## 2021-08-31 MED ORDER — SODIUM CHLORIDE 0.9% FLUSH: 3.0000 mL | INTRAVENOUS | Status: DC | PRN

## 2021-08-31 NOTE — Progress Notes (Addendum)
ANTICOAGULATION CONSULT NOTE   Pharmacy Consult for Heparin Indication: chest pain/ACS  No Known Allergies  Patient Measurements: Height: 5\' 3"  (160 cm) Weight: 56.7 kg (125 lb) IBW/kg (Calculated) : 52.4 Heparin Dosing Weight: 56.7 kg  Vital Signs: Temp: 97.7 F (36.5 C) (12/04 0742) Temp Source: Oral (12/04 0742) BP: 174/83 (12/04 0742) Pulse Rate: 74 (12/04 0742)  Labs: Recent Labs    08/30/21 1202 08/30/21 1410 08/30/21 2152 08/31/21 0052 08/31/21 0639  HGB 15.5*  --   --  14.8  --   HCT 45.3  --   --  42.2  --   PLT 224  --   --  184  --   LABPROT 12.1  --   --   --   --   INR 0.9  --   --   --   --   HEPARINUNFRC  --   --  0.17*  --  0.37  CREATININE 0.78  --   --  0.77  --   TROPONINIHS 1,217* 1,359*  --   --   --      Estimated Creatinine Clearance: 51 mL/min (by C-G formula based on SCr of 0.77 mg/dL).   Medical History: Past Medical History:  Diagnosis Date   Anxiety    Hepatitis     Medications:  Medications Prior to Admission  Medication Sig Dispense Refill Last Dose   metoprolol succinate (TOPROL-XL) 50 MG 24 hr tablet Take 50 mg by mouth daily. Take with or immediately following a meal.   08/30/2021    Scheduled:   aspirin EC  81 mg Oral Daily   azaTHIOprine  75 mg Oral Daily   metoprolol succinate  50 mg Oral Daily   Infusions:   heparin 850 Units/hr (08/31/21 0600)   nitroGLYCERIN 5 mcg/min (08/31/21 0600)   PRN: acetaminophen, nitroGLYCERIN, ondansetron (ZOFRAN) IV  Assessment: 75 yof presenting with chest pain. Heparin per pharmacy consult placed for chest pain/ACS. Patient is on not on anticoagulation prior to arrival.  Heparin level of 0.37 units/ml is therapeutic on heparin 850 units/hr. No issues with IV access or infusion and no bleeding noted per RN. Hgb 14.8. Plt 184.   Goal of Therapy:  Heparin level 0.3-0.7 units/ml Monitor platelets by anticoagulation protocol: Yes   Plan:  Continue heparin 850 units/hr  Check  confirmatory heparin level  Monitor heparin level, CBC and s/s of bleeding daily  Follow up cardiology plans   14/04/22, PharmD, BCPS Clinical Pharmacist 08/31/2021 7:47 AM   Addendum: Confirmatory heparin level of 0.35 units/ml is therapeutic on heparin 850 units/hr. Cardiology plans for [facility] on Monday.   Plan:  - Continue heparin 850 units/hr - Monitor daily heparin level since therapeutic x 2   Friday, PharmD, BCPS Clinical Pharmacist 08/31/2021 1:21 PM

## 2021-08-31 NOTE — Progress Notes (Addendum)
 Progress Note  Patient Name: [name] [name] Date of Encounter: 08/31/2021  [facility] Cardiologist: None   Subjective   No chest pain this morning. Very nervous about her cath tomorrow.   Inpatient Medications    Scheduled Meds:  aspirin EC  81 mg Oral Daily   azaTHIOprine  75 mg Oral Daily   metoprolol succinate  50 mg Oral Daily   Continuous Infusions:  heparin 850 Units/hr (08/31/21 0600)   nitroGLYCERIN 5 mcg/min (08/31/21 0600)   PRN Meds: acetaminophen, nitroGLYCERIN, ondansetron (ZOFRAN) IV   Vital Signs    Vitals:   08/31/21 0500 08/31/21 0600 08/31/21 0742 08/31/21 0800  BP: (!) 168/85 (!) 161/83 (!) 174/83 (!) 178/94  Pulse: 70 66 74 76  Resp: 18 16 19 16  Temp:   97.7 F (36.5 C)   TempSrc:   Oral   SpO2: 93% 96% 97% 96%  Weight:      Height:        Intake/Output Summary (Last 24 hours) at 08/31/2021 1024 Last data filed at 08/31/2021 0600 Gross per 24 hour  Intake 259.92 ml  Output 300 ml  Net -40.08 ml   Last 3 Weights 08/30/2021 06/02/2018  Weight (lbs) 125 lb 125 lb  Weight (kg) 56.7 kg 56.7 kg      Telemetry    NSR, occasional PVC - Personally Reviewed  ECG    NSR - Personally Reviewed  Physical Exam   GEN: No acute distress.   Neck: No JVD Cardiac: RRR, soft systolic murmur LUSB. No rubs or gallops.  Respiratory: Clear to auscultation bilaterally. GI: Soft, nontender, non-distended  MS: No edema; No deformity. Neuro:  Nonfocal  Psych: Normal affect   Labs    High Sensitivity Troponin:   Recent Labs  Lab 08/30/21 1202 08/30/21 1410  TROPONINIHS 1,217* 1,359*     Chemistry Recent Labs  Lab 08/30/21 1202 08/31/21 0052  NA 135 135  K 3.5 3.3*  CL 103 103  CO2 21* 22  GLUCOSE 113* 104*  BUN 21 18  CREATININE 0.78 0.77  CALCIUM 10.4* 8.9  GFRNONAA >60 >60  ANIONGAP 11 10    Lipids  Recent Labs  Lab 08/31/21 0052  CHOL 205*  TRIG 54  HDL 49  LDLCALC 145*  CHOLHDL 4.2    Hematology Recent Labs   Lab 08/30/21 1202 08/31/21 0052  WBC 9.4 6.7  RBC 4.90 4.54  HGB 15.5* 14.8  HCT 45.3 42.2  MCV 92.4 93.0  MCH 31.6 32.6  MCHC 34.2 35.1  RDW 13.2 13.2  PLT 224 184   Thyroid  Recent Labs  Lab 08/31/21 0052  TSH 4.625*    BNPNo results for input(s): BNP, PROBNP in the last 168 hours.  DDimer No results for input(s): DDIMER in the last 168 hours.   Radiology    DG Chest 2 View  Result Date: 08/30/2021 CLINICAL DATA:  Chest pain, intermittent chest pain to the last month, more severe in frequent yesterday and today. EXAM: CHEST - 2 VIEW COMPARISON:  None FINDINGS: EKG leads project over the chest. Trachea midline. Cardiomediastinal contours and hilar structures are normal. No sign of lobar consolidative process. No visible pneumothorax. On limited assessment there is no acute skeletal process. IMPRESSION: No acute cardiopulmonary disease. Electronically Signed   By: Geoffrey  Wile M.D.   On: 08/30/2021 13:07    Cardiac Studies   TTE pending  Patient Profile     75 y.o. female HTN, autoimmune hepatitis and tobacco   use who presented to [facility] with chest pain found to have elevated troponin now transferred to [facility] for NSTEMI  Assessment & Plan    #NSTEMI: Patient with ongoing intermittent chest pain that initially occurred with exertion but then progressed to occurring at rest prompting her to go to [facility] ER. There trop 2683>4196. ECG with NSR with <0.86mm anterolateral STD consistent with NSTEMI. Now transferred to [facility] for LHC.  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 Risks include but are not limited to bleeding, infection, vascular injury, stroke, myocardial infection, arrhythmia, kidney injury, radiation-related injury in the case of prolonged fluoroscopy use, emergency cardiac surgery, and death. The patient understands the risks of serious complication is 1-2 in 1000 with diagnostic cardiac cath and 1-2% or less with angioplasty/stenting.    For questions or updates, please contact [facility] Please consult [email or web address] for contact info under        Signed, , MD  08/31/2021, 10:24 AM

## 2021-08-31 NOTE — H&P (View-Only) (Signed)
Progress Note  Patient Name: [name] [name] Date of Encounter: 08/31/2021  [facility] Cardiologist: None   Subjective   No chest pain this morning. Very nervous about her cath tomorrow.   Inpatient Medications    Scheduled Meds:  aspirin EC  81 mg Oral Daily   azaTHIOprine  75 mg Oral Daily   metoprolol succinate  50 mg Oral Daily   Continuous Infusions:  heparin 850 Units/hr (08/31/21 0600)   nitroGLYCERIN 5 mcg/min (08/31/21 0600)   PRN Meds: acetaminophen, nitroGLYCERIN, ondansetron (ZOFRAN) IV   Vital Signs    Vitals:   08/31/21 0500 08/31/21 0600 08/31/21 0742 08/31/21 0800  BP: (!) 168/85 (!) 161/83 (!) 174/83 (!) 178/94  Pulse: 70 66 74 76  Resp: 18 16 19 16   Temp:   97.7 F (36.5 C)   TempSrc:   Oral   SpO2: 93% 96% 97% 96%  Weight:      Height:        Intake/Output Summary (Last 24 hours) at 08/31/2021 1024 Last data filed at 08/31/2021 0600 Gross per 24 hour  Intake 259.92 ml  Output 300 ml  Net -40.08 ml   Last 3 Weights 08/30/2021 06/02/2018  Weight (lbs) 125 lb 125 lb  Weight (kg) 56.7 kg 56.7 kg      Telemetry    NSR, occasional PVC - Personally Reviewed  ECG    NSR - Personally Reviewed  Physical Exam   GEN: No acute distress.   Neck: No JVD Cardiac: RRR, soft systolic murmur LUSB. No rubs or gallops.  Respiratory: Clear to auscultation bilaterally. GI: Soft, nontender, non-distended  MS: No edema; No deformity. Neuro:  Nonfocal  Psych: Normal affect   Labs    High Sensitivity Troponin:   Recent Labs  Lab 08/30/21 1202 08/30/21 1410  TROPONINIHS 1,217* 1,359*     Chemistry Recent Labs  Lab 08/30/21 1202 08/31/21 0052  NA 135 135  K 3.5 3.3*  CL 103 103  CO2 21* 22  GLUCOSE 113* 104*  BUN 21 18  CREATININE 0.78 0.77  CALCIUM 10.4* 8.9  GFRNONAA >60 >60  ANIONGAP 11 10    Lipids  Recent Labs  Lab 08/31/21 0052  CHOL 205*  TRIG 54  HDL 49  LDLCALC 145*  CHOLHDL 4.2    Hematology Recent Labs   Lab 08/30/21 1202 08/31/21 0052  WBC 9.4 6.7  RBC 4.90 4.54  HGB 15.5* 14.8  HCT 45.3 42.2  MCV 92.4 93.0  MCH 31.6 32.6  MCHC 34.2 35.1  RDW 13.2 13.2  PLT 224 184   Thyroid  Recent Labs  Lab 08/31/21 0052  TSH 4.625*    BNPNo results for input(s): BNP, PROBNP in the last 168 hours.  DDimer No results for input(s): DDIMER in the last 168 hours.   Radiology    DG Chest 2 View  Result Date: 08/30/2021 CLINICAL DATA:  Chest pain, intermittent chest pain to the last month, more severe in frequent yesterday and today. EXAM: CHEST - 2 VIEW COMPARISON:  None FINDINGS: EKG leads project over the chest. Trachea midline. Cardiomediastinal contours and hilar structures are normal. No sign of lobar consolidative process. No visible pneumothorax. On limited assessment there is no acute skeletal process. IMPRESSION: No acute cardiopulmonary disease. Electronically Signed   By: Zetta Bills M.D.   On: 08/30/2021 13:07    Cardiac Studies   TTE pending  Patient Profile     75 y.o. female HTN, autoimmune hepatitis and tobacco  use who presented to [facility] with chest pain found to have elevated troponin now transferred to [facility] for NSTEMI  Assessment & Plan    #NSTEMI: Patient with ongoing intermittent chest pain that initially occurred with exertion but then progressed to occurring at rest prompting her to go to [facility] ER. There trop 2683>4196. ECG with NSR with <0.86mm anterolateral STD consistent with NSTEMI. Now transferred to [facility] for LHC.  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 Risks include but are not limited to bleeding, infection, vascular injury, stroke, myocardial infection, arrhythmia, kidney injury, radiation-related injury in the case of prolonged fluoroscopy use, emergency cardiac surgery, and death. The patient understands the risks of serious complication is 1-2 in 1000 with diagnostic cardiac cath and 1-2% or less with angioplasty/stenting.    For questions or updates, please contact [facility] Please consult [email or web address] for contact info under        Signed, , MD  08/31/2021, 10:24 AM

## 2021-08-31 NOTE — Progress Notes (Signed)
  Echocardiogram 2D Echocardiogram has been performed.  Roosvelt Maser F 08/31/2021, 10:56 AM

## 2021-08-31 NOTE — Progress Notes (Signed)
Stat EKG done times 3 will not transfer to epic copies placed on chart

## 2021-09-01 ENCOUNTER — Encounter (HOSPITAL_COMMUNITY)
Admission: EM | Disposition: A | Discharge status: Home / Self Care | Payer: Self-pay | Source: Home / Self Care | Attending: Cardiology

## 2021-09-01 ENCOUNTER — Encounter (HOSPITAL_COMMUNITY): Payer: Self-pay | Admitting: Cardiology

## 2021-09-01 DIAGNOSIS — I214 Non-ST elevation (NSTEMI) myocardial infarction: Secondary | ICD-10-CM

## 2021-09-01 DIAGNOSIS — E785 Hyperlipidemia, unspecified: Secondary | ICD-10-CM | POA: Diagnosis not present

## 2021-09-01 DIAGNOSIS — I2510 Atherosclerotic heart disease of native coronary artery without angina pectoris: Secondary | ICD-10-CM | POA: Diagnosis not present

## 2021-09-01 DIAGNOSIS — I25110 Atherosclerotic heart disease of native coronary artery with unstable angina pectoris: Secondary | ICD-10-CM

## 2021-09-01 DIAGNOSIS — I10 Essential (primary) hypertension: Secondary | ICD-10-CM

## 2021-09-01 HISTORY — PX: LEFT HEART CATH AND CORONARY ANGIOGRAPHY: CATH118249

## 2021-09-01 LAB — CBC
HCT: 41.1 % (ref 36.0–46.0)
Hemoglobin: 14.1 g/dL (ref 12.0–15.0)
MCH: 32.3 pg (ref 26.0–34.0)
MCHC: 34.3 g/dL (ref 30.0–36.0)
MCV: 94.3 fL (ref 80.0–100.0)
Platelets: 177 10*3/uL (ref 150–400)
RBC: 4.36 MIL/uL (ref 3.87–5.11)
RDW: 13.2 % (ref 11.5–15.5)
WBC: 7.2 10*3/uL (ref 4.0–10.5)
nRBC: 0 % (ref 0.0–0.2)

## 2021-09-01 LAB — BASIC METABOLIC PANEL
Anion gap: 8 (ref 5–15)
BUN: 23 mg/dL (ref 8–23)
CO2: 20 mmol/L — ABNORMAL LOW (ref 22–32)
Calcium: 8.6 mg/dL — ABNORMAL LOW (ref 8.9–10.3)
Chloride: 107 mmol/L (ref 98–111)
Creatinine, Ser: 0.73 mg/dL (ref 0.44–1.00)
GFR, Estimated: >60 mL/min (ref >60)
Glucose, Bld: 90 mg/dL (ref 70–99)
Potassium: 3.8 mmol/L (ref 3.5–5.1)
Sodium: 135 mmol/L (ref 135–145)

## 2021-09-01 LAB — HEPARIN LEVEL (UNFRACTIONATED): Heparin Unfractionated: 0.36 IU/mL (ref 0.30–0.70)

## 2021-09-01 SURGERY — LEFT HEART CATH AND CORONARY ANGIOGRAPHY
Anesthesia: LOCAL

## 2021-09-01 MED ORDER — HEPARIN (PORCINE) IN NACL 1000-0.9 UT/500ML-% IV SOLN
INTRAVENOUS | Status: AC
  Filled 2021-09-01: qty 1000

## 2021-09-01 MED ORDER — HEPARIN (PORCINE) IN NACL 1000-0.9 UT/500ML-% IV SOLN
INTRAVENOUS | Status: AC
  Filled 2021-09-01: qty 500

## 2021-09-01 MED ORDER — VERAPAMIL HCL 2.5 MG/ML IV SOLN
INTRAVENOUS | Status: AC
  Filled 2021-09-01: qty 2

## 2021-09-01 MED ORDER — FENTANYL CITRATE (PF) 100 MCG/2ML IJ SOLN
INTRAMUSCULAR | Status: DC | PRN
  Administered 2021-09-01: 50 ug via INTRAVENOUS

## 2021-09-01 MED ORDER — SODIUM CHLORIDE 0.9% FLUSH: 3.0000 mL | INTRAVENOUS | Status: DC | PRN

## 2021-09-01 MED ORDER — SODIUM CHLORIDE 0.9% FLUSH
3.0000 mL | Freq: Two times a day (BID) | INTRAVENOUS | Status: DC
  Administered 2021-09-01 – 2021-09-07 (×5): 3 mL via INTRAVENOUS

## 2021-09-01 MED ORDER — MIDAZOLAM HCL 2 MG/2ML IJ SOLN
INTRAMUSCULAR | Status: AC
  Filled 2021-09-01: qty 2

## 2021-09-01 MED ORDER — SODIUM CHLORIDE 0.9 % IV SOLN: 250.0000 mL | INTRAVENOUS | Status: DC | PRN

## 2021-09-01 MED ORDER — LIDOCAINE HCL (PF) 1 % IJ SOLN
INTRAMUSCULAR | Status: DC | PRN
  Administered 2021-09-01: 2 mL via INTRADERMAL

## 2021-09-01 MED ORDER — LABETALOL HCL 5 MG/ML IV SOLN: 10.0000 mg | INTRAVENOUS | Status: AC | PRN

## 2021-09-01 MED ORDER — MIDAZOLAM HCL 2 MG/2ML IJ SOLN
INTRAMUSCULAR | Status: DC | PRN
  Administered 2021-09-01: 2 mg via INTRAVENOUS

## 2021-09-01 MED ORDER — SODIUM CHLORIDE 0.9 % IV SOLN: INTRAVENOUS | Status: AC

## 2021-09-01 MED ORDER — VERAPAMIL HCL 2.5 MG/ML IV SOLN
INTRAVENOUS | Status: DC | PRN
  Administered 2021-09-01 (×2): 10 mL via INTRA_ARTERIAL

## 2021-09-01 MED ORDER — HYDRALAZINE HCL 20 MG/ML IJ SOLN: 10.0000 mg | INTRAMUSCULAR | Status: AC | PRN

## 2021-09-01 MED ORDER — HEPARIN SODIUM (PORCINE) 1000 UNIT/ML IJ SOLN
INTRAMUSCULAR | Status: AC
  Filled 2021-09-01: qty 10

## 2021-09-01 MED ORDER — LIDOCAINE HCL (PF) 1 % IJ SOLN
INTRAMUSCULAR | Status: AC
  Filled 2021-09-01: qty 30

## 2021-09-01 MED ORDER — HEPARIN (PORCINE) IN NACL 1000-0.9 UT/500ML-% IV SOLN
INTRAVENOUS | Status: DC | PRN
  Administered 2021-09-01 (×2): 500 mL

## 2021-09-01 MED ORDER — HEPARIN SODIUM (PORCINE) 1000 UNIT/ML IJ SOLN
INTRAMUSCULAR | Status: DC | PRN
  Administered 2021-09-01: 3000 [IU] via INTRAVENOUS

## 2021-09-01 MED ORDER — ISOSORBIDE MONONITRATE ER 30 MG PO TB24
30.0000 mg | ORAL_TABLET | Freq: Every day | ORAL | Status: DC
  Administered 2021-09-01 – 2021-09-07 (×7): 30 mg via ORAL
  Filled 2021-09-01 (×7): qty 1

## 2021-09-01 MED ORDER — AMLODIPINE BESYLATE 5 MG PO TABS
5.0000 mg | ORAL_TABLET | Freq: Every day | ORAL | Status: DC
  Administered 2021-09-01 – 2021-09-03 (×3): 5 mg via ORAL
  Filled 2021-09-01 (×3): qty 1

## 2021-09-01 MED ORDER — HEPARIN (PORCINE) 25000 UT/250ML-% IV SOLN
850.0000 [IU]/h | INTRAVENOUS | Status: DC
  Administered 2021-09-01 – 2021-09-06 (×5): 850 [IU]/h via INTRAVENOUS
  Filled 2021-09-01 (×9): qty 250

## 2021-09-01 MED ORDER — IOHEXOL 350 MG/ML SOLN
INTRAVENOUS | Status: DC | PRN
  Administered 2021-09-01: 55 mL via INTRA_ARTERIAL

## 2021-09-01 MED ORDER — FENTANYL CITRATE (PF) 100 MCG/2ML IJ SOLN
INTRAMUSCULAR | Status: AC
  Filled 2021-09-01: qty 2

## 2021-09-01 SURGICAL SUPPLY — 14 items
CATH INFINITI 5 FR JL3.5 (CATHETERS) ×2 IMPLANT
CATH INFINITI JR4 5F (CATHETERS) ×2 IMPLANT
DEVICE RAD TR BAND REGULAR (VASCULAR PRODUCTS) ×2 IMPLANT
GLIDESHEATH SLEND SS 6F .021 (SHEATH) ×2 IMPLANT
GUIDEWIRE INQWIRE 1.5J.035X260 (WIRE) ×1 IMPLANT
INQWIRE 1.5J .035X260CM (WIRE) ×2
KIT ESSENTIALS PG (KITS) ×2 IMPLANT
KIT HEART LEFT (KITS) ×2 IMPLANT
PACK CARDIAC CATHETERIZATION (CUSTOM PROCEDURE TRAY) ×2 IMPLANT
SYR MEDRAD MARK 7 150ML (SYRINGE) ×2 IMPLANT
TRANSDUCER W/STOPCOCK (MISCELLANEOUS) ×2 IMPLANT
TUBING CIL FLEX 10 FLL-RA (TUBING) ×2 IMPLANT
WIRE HI TORQ BMW 190CM (WIRE) ×2 IMPLANT
WIRE HI TORQ VERSACORE-J 145CM (WIRE) ×2 IMPLANT

## 2021-09-01 NOTE — Progress Notes (Signed)
ANTICOAGULATION CONSULT NOTE   Pharmacy Consult for Heparin Indication: chest pain/ACS  No Known Allergies  Patient Measurements: Height: 5\' 3"  (160 cm) Weight: 56.7 kg (125 lb) IBW/kg (Calculated) : 52.4 Heparin Dosing Weight: 56.7 kg  Vital Signs: Temp: 97.6 F (36.4 C) (12/05 0835) Temp Source: Oral (12/05 0835) BP: 113/91 (12/05 0835) Pulse Rate: 70 (12/05 0835)  Labs: Recent Labs    08/30/21 1202 08/30/21 1410 08/30/21 2152 08/31/21 0052 08/31/21 0639 08/31/21 1004 08/31/21 1235 09/01/21 0102  HGB 15.5*  --   --  14.8  --   --   --  14.1  HCT 45.3  --   --  42.2  --   --   --  41.1  PLT 224  --   --  184  --   --   --  177  LABPROT 12.1  --   --   --   --   --   --   --   INR 0.9  --   --   --   --   --   --   --   HEPARINUNFRC  --   --    < >  --  0.37  --  0.35 0.36  CREATININE 0.78  --   --  0.77  --   --   --  0.73  TROPONINIHS 1,217* 1,359*  --   --   --  1,120*  --   --    < > = values in this interval not displayed.     Estimated Creatinine Clearance: 51 mL/min (by C-G formula based on SCr of 0.73 mg/dL).   Medical History: Past Medical History:  Diagnosis Date   Anxiety    Hepatitis     Medications:  Medications Prior to Admission  Medication Sig Dispense Refill Last Dose   metoprolol succinate (TOPROL-XL) 50 MG 24 hr tablet Take 50 mg by mouth daily. Take with or immediately following a meal.   08/30/2021    Scheduled:   aspirin EC  81 mg Oral Daily   azaTHIOprine  75 mg Oral Daily   metoprolol succinate  50 mg Oral Daily   sodium chloride flush  3 mL Intravenous Q12H   Infusions:   sodium chloride 1 mL/kg/hr (09/01/21 0456)   heparin 850 Units/hr (09/01/21 0500)   nitroGLYCERIN 5 mcg/min (09/01/21 0500)   PRN: acetaminophen, nitroGLYCERIN, ondansetron (ZOFRAN) IV, sodium chloride flush  Assessment: [name] [name] presenting with chest pain. Heparin per pharmacy consult placed for chest pain/ACS. Patient is on not on anticoagulation prior  to arrival.  Heparin level is therapeutic at 0.36, CBC remains stable.  Goal of Therapy:  Heparin level 0.3-0.7 units/ml Monitor platelets by anticoagulation protocol: Yes   Plan:  Continue heparin 850 units/hr  Daily heparin level and CBC  14/05/22, PharmD, [place], [facility] Clinical Pharmacist [phone] Please check AMION for all [facility] numbers 09/01/2021

## 2021-09-01 NOTE — Consult Note (Addendum)
[address]       [place] [address]             [phone]        [facility] Medical Record #[id number] Date of Birth: Apr 11, 1946  Referring:  Lennette Bihari, MD Primary Care: Juluis Rainier, MD (Inactive) Primary Cardiologist:None  Chief Complaint:   Chest discomfort   History of Present Illness:     [name] is a 75 year old female with past history hypertension, tobacco use, positive family history for cardiac disease, and history of autoimmune hepatitis treated with azathioprine.  She takes frequent walks in the park and recently noticed having exertional chest discomfort during her afternoon walks.  She had an episode of vague chest discomfort on 08/30/2021 which prompted her visit to [facility].  EKG showed sinus rhythm heart rate of 70 and evidence of an inferior infarct although age cannot be determined.  Initial troponin was elevated at greater than 1100.  Chest x-ray was unremarkable.  She was transferred and admitted to [facility] for additional evaluation and management.  She is started on heparin and nitroglycerin drips had no further chest discomfort.  She had an echocardiogram demonstrating trivial mitral insufficiency and trivial aortic regurgitation.  Her ejection fraction was estimated at 70 to 75% with some LV hypertrophy noted.  Ascending aorta measured at 37 mm.  This morning, she had left heart catheterization demonstrating three-vessel coronary artery disease.  Primary lesions identified included a 50% left main stenosis.  The left anterior descending coronary artery had a 90% mid stenosis.  The circumflex coronary artery had a 70% ostial stenosis, and the RCA had a 90% mid stenosis.  She has remained stable following left heart catheterization.  Nitroglycerin infusion was resumed but she has since been converted to an oral nitrate.  Heparin is scheduled to resume later today.  She remains  pain-free. CT surgery has been asked to evaluate [name] [name] for consideration of coronary bypass grafting.   [name] [name] is retired but she continues to be very active.  She was walking 7 miles a day but started having knee trouble and has reduced her daily walks down to 2 miles each morning and 2 miles each afternoon.  She admits that she is this most of the time and has had stress with traditional management with anxiolytics or SSRIs.  She admits that her smoking has increased since she retired and she is reluctant to agree to have heart surgery because she knows she will be asked to quit smoking but she says "it is one of the few things I enjoy".  She has a history of autoimmune hepatitis that was initially managed by gastroenterology here locally.  She has since transferred care down to the department of gastroenterology at [facility] and is cared for by Dr. Corky Sing.  She has been on azathioprine for about 3 years and on recent visits to Dr. Corky Sing, he has discussed tapering or discontinuing that medication since her liver enzymes have been near normal for a few years now.  In regards to her dentition, her lower teeth are in good repair.  She has 6 remaining teeth in her upper jaw.  The far left tooth has some decay and is loose.  She is currently taking amoxicillin for this and was scheduled to have it removed today by her dentist.   Current Activity/ Functional Status: Patient is independent with  mobility/ambulation, transfers, ADL's, IADL's.   Zubrod Score: At the time of surgery this patient's most appropriate activity status/level should be described as: []     0    Normal activity, no symptoms []     1    Restricted in physical strenuous activity but ambulatory, able to do out light work [x]     2    Ambulatory and capable of self care, unable to do work activities, up and about                 more than 50%  Of the time                            []     3    Only limited self care,  in bed greater than 50% of waking hours []     4    Completely disabled, no self care, confined to bed or chair []     5    Moribund  Past Medical History:  Diagnosis Date   Anxiety    Hepatitis     Past Surgical History:  Procedure Laterality Date   LEFT HEART CATH AND CORONARY ANGIOGRAPHY N/A 09/01/2021   Procedure: LEFT HEART CATH AND CORONARY ANGIOGRAPHY;  Surgeon: Nelva Bush, MD;  Location: [facility];  Service: Cardiovascular;  Laterality: N/A;    Social History   Tobacco Use  Smoking Status Every Day   Packs/day: 1.50   Years: 30.00   Pack years: 45.00   Types: Cigarettes  Smokeless Tobacco Never    Social History   Substance and Sexual Activity  Alcohol Use Not Currently     Allergies  Allergen Reactions   Budesonide Rash    Current Facility-Administered Medications  Medication Dose Route Frequency Provider Last Rate Last Admin   0.9 %  sodium chloride infusion   Intravenous Continuous End, Christopher, MD 50 mL/hr at 09/01/21 1215 New Bag at 09/01/21 1215   0.9 %  sodium chloride infusion  250 mL Intravenous PRN End, Harrell Gave, MD       acetaminophen (TYLENOL) tablet 650 mg  650 mg Oral Q4H PRN End, Harrell Gave, MD       amLODipine (NORVASC) tablet 5 mg  5 mg Oral Daily Troy Sine, MD   5 mg at 09/01/21 1249   aspirin EC tablet 81 mg  81 mg Oral Daily End, Christopher, MD   81 mg at 09/01/21 0834   azaTHIOprine (IMURAN) tablet 75 mg  75 mg Oral Daily End, Christopher, MD   75 mg at 09/01/21 0834   heparin ADULT infusion 100 units/mL (25000 units/258mL)  850 Units/hr Intravenous Continuous Einar Grad, RPH 8.5 mL/hr at 09/01/21 1622 850 Units/hr at 09/01/21 1622   hydrALAZINE (APRESOLINE) injection 10 mg  10 mg Intravenous Q20 Min PRN End, Harrell Gave, MD       isosorbide mononitrate (IMDUR) 24 hr tablet 30 mg  30 mg Oral Daily End, Christopher, MD   30 mg at 09/01/21 1216   labetalol (NORMODYNE) injection 10 mg  10 mg Intravenous Q10  min PRN End, Harrell Gave, MD       metoprolol succinate (TOPROL-XL) 24 hr tablet 50 mg  50 mg Oral Daily End, Christopher, MD   50 mg at 09/01/21 0834   nitroGLYCERIN (NITROSTAT) SL tablet 0.4 mg  0.4 mg Sublingual Q5 Min x 3 PRN End, Harrell Gave, MD       ondansetron (ZOFRAN) injection 4 mg  4 mg Intravenous Q6H PRN End, Harrell Gave, MD       sodium chloride flush (NS) 0.9 % injection 3 mL  3 mL Intravenous Q12H End, Christopher, MD   3 mL at 09/01/21 1514   sodium chloride flush (NS) 0.9 % injection 3 mL  3 mL Intravenous PRN End, Harrell Gave, MD        Medications Prior to Admission  Medication Sig Dispense Refill Last Dose   azaTHIOprine (IMURAN) 50 MG tablet Take 75 mg by mouth daily.   08/30/2021   metoprolol succinate (TOPROL-XL) 50 MG 24 hr tablet Take 50 mg by mouth daily. Take with or immediately following a meal.   08/30/2021 at 0800   amoxicillin (AMOXIL) 500 MG capsule Take 500 mg by mouth 3 (three) times daily. (Patient not taking: Reported on 09/01/2021)   Completed Course    Family History  Problem Relation Age of Onset   Premature CHD Father      Review of Systems:      Cardiac Review of Systems: Y or  [    ]= no  Chest Pain [ x   ]  Resting SOB [   ] Exertional SOB  [  ]  Orthopnea [  ]   Pedal Edema [   ]    Palpitations [  ] Syncope  [  ]   Presyncope [   ]  General Review of Systems: [Y] = yes [  ]=no Constitional: recent weight change [  ]; anorexia [  ]; fatigue [  ]; nausea [  ]; night sweats [  ]; fever [  ]; or chills [  ]                                                               Dental: Last Dentist visit: Every 3 months  Eye : blurred vision [  ]; diplopia [   ]; vision changes [  ];  Amaurosis fugax[  ]; Resp: cough [  ];  wheezing[  ];  hemoptysis[  ]; shortness of breath[  ]; paroxysmal nocturnal dyspnea[  ]; dyspnea on exertion[  ]; or orthopnea[  ];  GI:  gallstones[  ], vomiting[  ];  dysphagia[  ]; melena[  ];  hematochezia [  ]; heartburn[  ];   Hx  of  Colonoscopy[  ]; GU: kidney stones [  ]; hematuria[  ];   dysuria [  ];  nocturia[  ];  history of     obstruction [  ]; urinary frequency [  ]             Skin: rash, swelling[  ];, hair loss[  ];  peripheral edema[  ];  or itching[  ]; Musculosketetal: myalgias[  ];  joint swelling[  ];  joint erythema[  ];  joint pain[  ];  back pain[  ];  Heme/Lymph: bruising[  ];  bleeding[  ];  anemia[  ];  Neuro: TIA[  ];  headaches[  ];  stroke[  ];  vertigo[  ];  seizures[  ];   paresthesias[  ];  difficulty walking[  ];  Psych:depression[  ]; anxiety[ xx ];  Endocrine: diabetes[  ];  thyroid dysfunction[  ];  Physical Exam: BP 103/69 (BP Location: Left Arm)   Pulse 87   Temp 98 F (36.7 C) (Oral)   Resp 18   Ht 5\' 3"  (1.6 m)   Wt 56.7 kg   SpO2 95%   BMI 22.14 kg/m    General appearance: alert, cooperative, moderate distress, and notably anxious throughout the interview Head: Normocephalic, without obvious abnormality, atraumatic Neck: no adenopathy, no carotid bruit, no JVD, and supple, symmetrical, trachea midline Lymph nodes: No cervical or clavicular adenopathy Resp: Breath sounds are full, equal, and clear to auscultation Cardio: regular rate and rhythm and no murmur GI: Soft and nontender Extremities: No obvious deformities.  All extremities are well perfused with palpable distal pulses.  She has prominent venous markings in both legs but no obvious varicosities. Neurologic: Grossly normal  Diagnostic Studies & Laboratory data:     LEFT HEART CATH AND CORONARY ANGIOGRAPHY   Conclusion  Conclusions: Severe three-vessel coronary artery disease, as detailed below. Mildly elevated left ventricular filling pressure.   Recommendations: Cardiac surgery consultation for CABG. Restart IV heparin 2 hours after TR band removal. Start isosorbide mononitrate and wean off nitroglycerin infusion, as tolerated. Aggressive secondary prevention of coronary artery  disease.   Nelva Bush, MD [facility]   Coronary Findings  Diagnostic Dominance: Right Left Main  Vessel is large.  Dist LM lesion is 50% stenosed.    Left Anterior Descending  Prox LAD to Mid LAD lesion is 50% stenosed.  Mid LAD-1 lesion is 90% stenosed.  Mid LAD-2 lesion is 50% stenosed.    First Diagonal Branch  Vessel is moderate in size.  1st Diag lesion is 20% stenosed.    Second Diagonal Branch  Vessel is moderate in size.    Left Circumflex  Vessel is moderate in size.  Ost Cx to Prox Cx lesion is 70% stenosed.  Dist Cx lesion is 20% stenosed.    First Obtuse Marginal Branch  Vessel is small in size.    Second Obtuse Marginal Branch  Vessel is moderate in size.    Right Coronary Artery  Vessel is large.  Prox RCA to Mid RCA lesion is 90% stenosed. The lesion is irregular.  Dist RCA lesion is 50% stenosed.    Right Posterior Descending Artery  Vessel is small in size.    Right Posterior Atrioventricular Artery  Vessel is small in size.  RPAV lesion is 40% stenosed.    First Right Posterolateral Branch  Vessel is small in size.    Second Right Posterolateral Branch  Vessel is small in size.    Intervention   No interventions have been documented.   Left Heart  Left Ventricle LV end diastolic pressure is mildly elevated. LVEDP 15-20 mmHg.  Aortic Valve There is no aortic valve stenosis.   Coronary Diagrams   Diagnostic Dominance: Right          Recent Radiology Findings:   ECHOCARDIOGRAM REPORT         Patient Name:   [name] [name] Date of Exam: 08/31/2021  Medical Rec #:  [id number]         Height:       63.0 in  Accession #:    [id number]        Weight:       125.0 lb  Date of Birth:  1945/12/18         BSA:          1.584 m  Patient Age:    56  years          BP:           120/91 mmHg  Patient Gender: F                 HR:           83 bpm.  Exam Location:  Inpatient   Procedure: 2D Echo, Cardiac Doppler and  Color Doppler   Indications:    NSTEMI I21.4     History:        Patient has no prior history of Echocardiogram  examinations.                  Signs/Symptoms:Chest Pain and Shortness of Breath.     Sonographer:    Roosvelt Maser RDCS  Referring Phys: [id number] MATTHEW A CARLISLE   IMPRESSIONS     1. Left ventricular ejection fraction, by estimation, is 70 to 75%. The  left ventricle has hyperdynamic function. The left ventricle has no  regional wall motion abnormalities. There is moderate concentric left  ventricular hypertrophy. Left ventricular  diastolic parameters are consistent with Grade I diastolic dysfunction  (impaired relaxation).   2. Right ventricular systolic function is normal. The right ventricular  size is normal. Tricuspid regurgitation signal is inadequate for assessing  PA pressure.   3. The mitral valve is normal in structure. Trivial mitral valve  regurgitation.   4. The aortic valve is tricuspid. There is mild calcification of the  aortic valve. There is mild thickening of the aortic valve. Aortic valve  regurgitation is trivial.   5. Aortic dilatation noted. 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   6. The inferior vena cava is normal in size with greater than 50%  respiratory variability, suggesting right atrial pressure of 3 mmHg.   Comparison(s): No prior Echocardiogram.   FINDINGS   Left Ventricle: Left ventricular ejection fraction, by estimation, is 70  to 75%. The left ventricle has hyperdynamic function. The left ventricle  has no regional wall motion abnormalities. The left ventricular internal  cavity size was normal in size.  There is moderate concentric left ventricular hypertrophy. Left  ventricular diastolic parameters are consistent with Grade I diastolic  dysfunction (impaired relaxation).   Right Ventricle: The right ventricular size is normal. No increase in  right ventricular wall thickness. Right  ventricular systolic function is  normal. Tricuspid regurgitation signal is inadequate for assessing PA  pressure.   Left Atrium: Left atrial size was normal in size.   Right Atrium: Right atrial size was normal in size.   Pericardium: There is no evidence of pericardial effusion.   Mitral Valve: The mitral valve is normal in structure. There is mild  thickening of the mitral valve leaflet(s). There is mild calcification of  the mitral valve leaflet(s). Mild mitral annular calcification. Trivial  mitral valve regurgitation.   Tricuspid Valve: The tricuspid valve is normal in structure. Tricuspid  valve regurgitation is trivial.   Aortic Valve: The aortic valve is tricuspid. There is mild calcification  of the aortic valve. There is mild thickening of the aortic valve. Aortic  valve regurgitation is trivial.   Pulmonic Valve: The pulmonic valve was not well visualized. Pulmonic valve  regurgitation is trivial.   Aorta: Aortic dilatation noted. 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   Venous: The inferior vena cava is normal in size with greater than 50%  respiratory variability, suggesting  right atrial pressure of 3 mmHg.   IAS/Shunts: No atrial level shunt detected by color flow Doppler.      LEFT VENTRICLE  PLAX 2D  LVIDd:         3.90 cm   Diastology  LVIDs:         2.30 cm   LV e' medial:    4.68 cm/s  LV PW:         1.30 cm   LV E/e' medial:  12.9  LV IVS:        1.20 cm   LV e' lateral:   8.49 cm/s  LVOT diam:     2.00 cm   LV E/e' lateral: 7.1  LV SV:         56  LV SV Index:   35  LVOT Area:     3.14 cm      RIGHT VENTRICLE  RV Basal diam:  2.30 cm   LEFT ATRIUM             Index        RIGHT ATRIUM           Index  LA diam:        2.40 cm 1.52 cm/m   RA Area:     12.30 cm  LA Vol (A2C):   34.5 ml 21.79 ml/m  RA Volume:   29.80 ml  18.82 ml/m  LA Vol (A4C):   27.5 ml 17.37 ml/m  LA Biplane Vol: 32.9 ml 20.78 ml/m   AORTIC  VALVE  LVOT Vmax:   99.00 cm/s  LVOT Vmean:  64.500 cm/s  LVOT VTI:    0.178 m     AORTA  Ao Root diam: 3.50 cm  Ao Asc diam:  4.10 cm   MITRAL VALVE  MV Area (PHT): 2.99 cm     SHUNTS  MV Decel Time: 254 msec     Systemic VTI:  0.18 m  MV E velocity: 60.30 cm/s   Systemic Diam: 2.00 cm  MV A velocity: 108.00 cm/s  MV E/A ratio:  0.56   Gwyndolyn Kaufman MD  Electronically signed by Gwyndolyn Kaufman MD  Signature Date/Time: 08/31/2021/1:13:30 PM   I have independently reviewed the above radiologic studies and discussed with the patient   Recent Lab Findings: Lab Results  Component Value Date   WBC 7.2 09/01/2021   HGB 14.1 09/01/2021   HCT 41.1 09/01/2021   PLT 177 09/01/2021   GLUCOSE 90 09/01/2021   CHOL 205 (H) 08/31/2021   TRIG 54 08/31/2021   HDL 49 08/31/2021   LDLCALC 145 (H) 08/31/2021   NA 135 09/01/2021   K 3.8 09/01/2021   CL 107 09/01/2021   CREATININE 0.73 09/01/2021   BUN 23 09/01/2021   CO2 20 (L) 09/01/2021   TSH 4.625 (H) 08/31/2021   INR 0.9 08/30/2021   HGBA1C 5.5 08/31/2021      Assessment / Plan:      -Coronary artery disease: 75 year old female admitted after ruling in for non-ST elevation myocardial infarction that was found on left heart catheterization and had a severe three-vessel coronary artery disease with preserved LV function.  She does have moderate concentric left ventricular hypertrophy on echo.  There is no significant valvular disease.  She is stable and free of any chest pain on heparin infusion and oral nitrate.  We agree that coronary bypass grafting is her best option for revascularization.  I explained the procedure to Ms. Leppanen and her  sister who was present during this interview.  We discussed the expected perioperative course and all of their questions were answered.  As mentioned above, Ms. Vecchiarelli is quite nervous about being in the hospital and facing heart surgery and she would like to think about it before she makes a  decision.  She is most concerned about being asked to stop smoking since it is one of the few things that she enjoys besides walking.  I let her know that she was welcome to consider her options  and ask more questions.  The earliest we could schedule an elective CABG is 09/08/2021.  -History of autoimmune hepatitis: This has been managed for about 3 years by Dr. Damita Lack at [facility] with azathioprine but she tells me that he has considered tapering off this medication at recent visits since her liver enzymes have been near normal.  I do not see that she has had an hepatic profile this admission so I will order LFTs.  For optimal healing, she would be best served by being off of the azathioprine perioperatively.  Will discuss with Dr. Cyndia Bent and possibly contact Dr. Damita Lack about stopping the medication if she decides she wants to have surgery.  -Tooth decay-and a single tooth in her left upper jaw.  She was scheduled to have this removed today by her dentist.    -Hypertension-managed at home with metoprolol XL 50 mg once daily.  -Dr. Cyndia Bent will review this along with clinical data and coronary angiography.  He will see her tomorrow.  I  spent 40 minutes counseling the patient face to face.  Antony Odea, PA-C  09/01/2021 4:31 PM    Chart reviewed, patient examined, agree with above. She has severe 3 vessel coronary artery disease presenting with NSTEMI. Echo shows normal LVEF with no significant valvular abnormality. Her coronary arteries are smaller caliber and with 3 vessel disease I think CABG is the best option for treating her. She is on azathioprine for autoimmune hepatitis and had been on it for almost 3 years. She said her GI doctor at [facility] was planning to use it for 3 years and then taper her off of it. Ideally she needs to be off of that for a week before surgery if possible to decrease wound complications and infection. We will need to check with Dr. Damita Lack but I  would recommend stopping for a while and if she needs to be on it we can resume a few weeks postop. She also has a bad tooth that was supposed to be pulled today and was on Amoxicillin for 8 days prior to prepare for that. We could see if Dr. Benson Norway can remove it preop in the hospital this week. Otherwise she will have to be kept on antibiotics and have it pulled later. She is very anxious about surgery, not being able to smoke anymore which she says she really enjoys. She is going to think about it and we will plan to do next Monday if she agrees. I will hold the azathioprine pending talking with Dr. Damita Lack.

## 2021-09-01 NOTE — Interval H&P Note (Signed)
History and Physical Interval Note:  09/01/2021 10:55 AM  [name] [name]  has presented today for surgery, with the diagnosis of NSTEMI.  The various methods of treatment have been discussed with the patient and family. After consideration of risks, benefits and other options for treatment, the patient has consented to  Procedure(s): LEFT HEART CATH AND CORONARY ANGIOGRAPHY (N/A) as a surgical intervention.  The patient's history has been reviewed, patient examined, no change in status, stable for surgery.  I have reviewed the patient's chart and labs.  Questions were answered to the patient's satisfaction.    Cath Lab Visit (complete for each Cath Lab visit)  Clinical Evaluation Leading to the Procedure:   ACS: Yes.    Non-ACS:  N/A  Dasie Chancellor

## 2021-09-01 NOTE — Progress Notes (Signed)
TR band removed. Site WDL.  Netta Corrigan, RN

## 2021-09-01 NOTE — Progress Notes (Signed)
TCTS consulted for CABG evaluation. °

## 2021-09-01 NOTE — Brief Op Note (Signed)
BRIEF CARDIAC CATHETERIZATION NOTE  09/01/2021  11:51 AM  PATIENT:  [name] [name]  75 y.o. female  PRE-OPERATIVE DIAGNOSIS:  NSTEMI  POST-OPERATIVE DIAGNOSIS:  NSTEMI  PROCEDURE:  Procedure(s): LEFT HEART CATH AND CORONARY ANGIOGRAPHY (N/A)  SURGEON:  Surgeon(s) and Role:    * Anyiah Coverdale, Cristal Deer, MD - Primary  FINDINGS: Severe three-vessel CAD. Mildly elevated LVEDP.  RECOMMENDATIONS: Cardiac surgery consultation for CABG. Transition from NTG infusion to Imdur. Restart IV heparin 2 hours after TR band removal.  Yvonne Kendall, MD [facility]

## 2021-09-01 NOTE — Progress Notes (Signed)
ANTICOAGULATION CONSULT NOTE   Pharmacy Consult for Heparin Indication: chest pain/ACS  Allergies  Allergen Reactions   Budesonide Rash    Patient Measurements: Height: 5\' 3"  (160 cm) Weight: 56.7 kg (125 lb) IBW/kg (Calculated) : 52.4 Heparin Dosing Weight: 56.7 kg  Vital Signs: Temp: 98.1 F (36.7 C) (12/05 1157) Temp Source: Oral (12/05 1157) BP: 103/67 (12/05 1400) Pulse Rate: 82 (12/05 1157)  Labs: Recent Labs    08/30/21 1202 08/30/21 1410 08/30/21 2152 08/31/21 0052 08/31/21 0639 08/31/21 1004 08/31/21 1235 09/01/21 0102  HGB 15.5*  --   --  14.8  --   --   --  14.1  HCT 45.3  --   --  42.2  --   --   --  41.1  PLT 224  --   --  184  --   --   --  177  LABPROT 12.1  --   --   --   --   --   --   --   INR 0.9  --   --   --   --   --   --   --   HEPARINUNFRC  --   --    < >  --  0.37  --  0.35 0.36  CREATININE 0.78  --   --  0.77  --   --   --  0.73  TROPONINIHS 1,217* 1,359*  --   --   --  1,120*  --   --    < > = values in this interval not displayed.     Estimated Creatinine Clearance: 51 mL/min (by C-G formula based on SCr of 0.73 mg/dL).   Medical History: Past Medical History:  Diagnosis Date   Anxiety    Hepatitis     Medications:  Medications Prior to Admission  Medication Sig Dispense Refill Last Dose   azaTHIOprine (IMURAN) 50 MG tablet Take 75 mg by mouth daily.   08/30/2021   metoprolol succinate (TOPROL-XL) 50 MG 24 hr tablet Take 50 mg by mouth daily. Take with or immediately following a meal.   08/30/2021 at 0800   amoxicillin (AMOXIL) 500 MG capsule Take 500 mg by mouth 3 (three) times daily. (Patient not taking: Reported on 09/01/2021)   Completed Course    Scheduled:   amLODipine  5 mg Oral Daily   aspirin EC  81 mg Oral Daily   azaTHIOprine  75 mg Oral Daily   isosorbide mononitrate  30 mg Oral Daily   metoprolol succinate  50 mg Oral Daily   sodium chloride flush  3 mL Intravenous Q12H   Infusions:   sodium chloride 50  mL/hr at 09/01/21 1215   sodium chloride     PRN: sodium chloride, acetaminophen, hydrALAZINE, labetalol, nitroGLYCERIN, ondansetron (ZOFRAN) IV, sodium chloride flush  Assessment: 75 yof presenting with chest pain. Heparin per pharmacy consult placed for chest pain/ACS. Patient is on not on anticoagulation prior to arrival.  Pt s/p cath with mvCAD >> planning CABG consult. Pharmacy to start heparin 2h after TR band removal (removed ~1415).  Goal of Therapy:  Heparin level 0.3-0.7 units/ml Monitor platelets by anticoagulation protocol: Yes   Plan:  Resume heparin 850 units/hr no bolus at 1630 Daily heparin level and CBC   14/05/22, PharmD, [place], [facility] Clinical Pharmacist [phone] Please check AMION for all [facility] Pharmacy numbers 09/01/2021

## 2021-09-01 NOTE — Progress Notes (Addendum)
Progress Note  Patient Name: [name] [name] Date of Encounter: 09/01/2021  [facility] Cardiologist: None new to Dr. Shari Prows   Subjective   Feeling well. No chest pain, sob or palpitations.    Inpatient Medications    Scheduled Meds:  aspirin EC  81 mg Oral Daily   azaTHIOprine  75 mg Oral Daily   metoprolol succinate  50 mg Oral Daily   sodium chloride flush  3 mL Intravenous Q12H   Continuous Infusions:  sodium chloride 1 mL/kg/hr (09/01/21 0456)   heparin 850 Units/hr (09/01/21 0500)   nitroGLYCERIN 5 mcg/min (09/01/21 0500)   PRN Meds: acetaminophen, nitroGLYCERIN, ondansetron (ZOFRAN) IV, sodium chloride flush   Vital Signs    Vitals:   09/01/21 0100 09/01/21 0200 09/01/21 0300 09/01/21 0835  BP: (!) 158/66 (!) 167/71 129/65 (!) 113/91  Pulse:   66 70  Resp: 17 20 17 13   Temp:   98.4 F (36.9 C) 97.6 F (36.4 C)  TempSrc:   Oral Oral  SpO2: 93% 91% 92% 94%  Weight:      Height:        Intake/Output Summary (Last 24 hours) at 09/01/2021 1029 Last data filed at 09/01/2021 0500 Gross per 24 hour  Intake 350.02 ml  Output 850 ml  Net -499.98 ml   Last 3 Weights 08/30/2021 06/02/2018  Weight (lbs) 125 lb 125 lb  Weight (kg) 56.7 kg 56.7 kg      Telemetry    NSR - Personally Reviewed  ECG    SR, TWI in III,aVF - Personally Reviewed  Physical Exam   GEN: No acute distress.   Neck: No JVD Cardiac: RRR, no murmurs, rubs, or gallops.  Respiratory: Clear to auscultation bilaterally. GI: Soft, nontender, non-distended  MS: No edema; No deformity. Neuro:  Nonfocal  Psych: Normal affect   Labs    High Sensitivity Troponin:   Recent Labs  Lab 08/30/21 1202 08/30/21 1410 08/31/21 1004  TROPONINIHS 1,217* 1,359* 1,120*     Chemistry Recent Labs  Lab 08/30/21 1202 08/31/21 0052 09/01/21 0102  NA 135 135 135  K 3.5 3.3* 3.8  CL 103 103 107  CO2 21* 22 20*  GLUCOSE 113* 104* 90  BUN 21 18 23   CREATININE 0.78 0.77 0.73  CALCIUM  10.4* 8.9 8.6*  GFRNONAA >60 >60 >60  ANIONGAP 11 10 8     Lipids  Recent Labs  Lab 08/31/21 0052  CHOL 205*  TRIG 54  HDL 49  LDLCALC 145*  CHOLHDL 4.2    Hematology Recent Labs  Lab 08/30/21 1202 08/31/21 0052 09/01/21 0102  WBC 9.4 6.7 7.2  RBC 4.90 4.54 4.36  HGB 15.5* 14.8 14.1  HCT 45.3 42.2 41.1  MCV 92.4 93.0 94.3  MCH 31.6 32.6 32.3  MCHC 34.2 35.1 34.3  RDW 13.2 13.2 13.2  PLT 224 184 177   Thyroid  Recent Labs  Lab 08/31/21 0052  TSH 4.625*    Radiology    DG Chest 2 View  Result Date: 08/30/2021 CLINICAL DATA:  Chest pain, intermittent chest pain to the last month, more severe in frequent yesterday and today. EXAM: CHEST - 2 VIEW COMPARISON:  None FINDINGS: EKG leads project over the chest. Trachea midline. Cardiomediastinal contours and hilar structures are normal. No sign of lobar consolidative process. No visible pneumothorax. On limited assessment there is no acute skeletal process. IMPRESSION: No acute cardiopulmonary disease. Electronically Signed   By: 14/05/22 M.D.   On: 08/30/2021 13:07  ECHOCARDIOGRAM COMPLETE  Result Date: 08/31/2021    ECHOCARDIOGRAM REPORT   Patient Name:   [name] [name] Date of Exam: 08/31/2021 Medical Rec #:  [id number]         Height:       63.0 in Accession #:    [id number]        Weight:       125.0 lb Date of Birth:  [birth year]         BSA:          1.584 m Patient Age:    75 years          BP:           120/91 mmHg Patient Gender: F                 HR:           83 bpm. Exam Location:  Inpatient Procedure: 2D Echo, Cardiac Doppler and Color Doppler Indications:    NSTEMI I21.4  History:        Patient has no prior history of Echocardiogram examinations.                 Signs/Symptoms:Chest Pain and Shortness of Breath.  Sonographer:    Merrie Roof RDCS Referring Phys: [id number] [place]  1. Left ventricular ejection fraction, by estimation, is 70 to 75%. The left ventricle has hyperdynamic  function. The left ventricle has no regional wall motion abnormalities. There is moderate concentric left ventricular hypertrophy. Left ventricular diastolic parameters are consistent with Grade I diastolic dysfunction (impaired relaxation).  2. Right ventricular systolic function is normal. The right ventricular size is normal. Tricuspid regurgitation signal is inadequate for assessing PA pressure.  3. The mitral valve is normal in structure. Trivial mitral valve regurgitation.  4. The aortic valve is tricuspid. There is mild calcification of the aortic valve. There is mild thickening of the aortic valve. Aortic valve regurgitation is trivial.  5. Aortic dilatation noted. 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  6. The inferior vena cava is normal in size with greater than 50% respiratory variability, suggesting right atrial pressure of 3 mmHg. Comparison(s): No prior Echocardiogram. FINDINGS  Left Ventricle: Left ventricular ejection fraction, by estimation, is 70 to 75%. The left ventricle has hyperdynamic function. The left ventricle has no regional wall motion abnormalities. The left ventricular internal cavity size was normal in size. There is moderate concentric left ventricular hypertrophy. Left ventricular diastolic parameters are consistent with Grade I diastolic dysfunction (impaired relaxation). Right Ventricle: The right ventricular size is normal. No increase in right ventricular wall thickness. Right ventricular systolic function is normal. Tricuspid regurgitation signal is inadequate for assessing PA pressure. Left Atrium: Left atrial size was normal in size. Right Atrium: Right atrial size was normal in size. Pericardium: There is no evidence of pericardial effusion. Mitral Valve: The mitral valve is normal in structure. There is mild thickening of the mitral valve leaflet(s). There is mild calcification of the mitral valve leaflet(s). Mild mitral annular calcification.  Trivial mitral valve regurgitation. Tricuspid Valve: The tricuspid valve is normal in structure. Tricuspid valve regurgitation is trivial. Aortic Valve: The aortic valve is tricuspid. There is mild calcification of the aortic valve. There is mild thickening of the aortic valve. Aortic valve regurgitation is trivial. Pulmonic Valve: The pulmonic valve was not well visualized. Pulmonic valve regurgitation is trivial. Aorta: Aortic dilatation noted. There is mild dilatation of the ascending aorta,  measuring 37 mm. Venous: The inferior vena cava is normal in size with greater than 50% respiratory variability, suggesting right atrial pressure of 3 mmHg. IAS/Shunts: No atrial level shunt detected by color flow Doppler.  LEFT VENTRICLE PLAX 2D LVIDd:         3.90 cm   Diastology LVIDs:         2.30 cm   LV e' medial:    4.68 cm/s LV PW:         1.30 cm   LV E/e' medial:  12.9 LV IVS:        1.20 cm   LV e' lateral:   8.49 cm/s LVOT diam:     2.00 cm   LV E/e' lateral: 7.1 LV SV:         56 LV SV Index:   35 LVOT Area:     3.14 cm  RIGHT VENTRICLE RV Basal diam:  2.30 cm LEFT ATRIUM             Index        RIGHT ATRIUM           Index LA diam:        2.40 cm 1.52 cm/m   RA Area:     12.30 cm LA Vol (A2C):   34.5 ml 21.79 ml/m  RA Volume:   29.80 ml  18.82 ml/m LA Vol (A4C):   27.5 ml 17.37 ml/m LA Biplane Vol: 32.9 ml 20.78 ml/m  AORTIC VALVE LVOT Vmax:   99.00 cm/s LVOT Vmean:  64.500 cm/s LVOT VTI:    0.178 m  AORTA Ao Root diam: 3.50 cm Ao Asc diam:  4.10 cm MITRAL VALVE MV Area (PHT): 2.99 cm     SHUNTS MV Decel Time: 254 msec     Systemic VTI:  0.18 m MV E velocity: 60.30 cm/s   Systemic Diam: 2.00 cm MV A velocity: 108.00 cm/s MV E/A ratio:  0.56 Gwyndolyn Kaufman MD Electronically signed by Gwyndolyn Kaufman MD Signature Date/Time: 08/31/2021/1:13:30 PM    Final     Cardiac Studies   Echo 08/31/2021 1. Left ventricular ejection fraction, by estimation, is 70 to 75%. The  left ventricle has hyperdynamic  function. The left ventricle has no  regional wall motion abnormalities. There is moderate concentric left  ventricular hypertrophy. Left ventricular  diastolic parameters are consistent with Grade I diastolic dysfunction  (impaired relaxation).   2. Right ventricular systolic function is normal. The right ventricular  size is normal. Tricuspid regurgitation signal is inadequate for assessing  PA pressure.   3. The mitral valve is normal in structure. Trivial mitral valve  regurgitation.   4. The aortic valve is tricuspid. There is mild calcification of the  aortic valve. There is mild thickening of the aortic valve. Aortic valve  regurgitation is trivial.   5. Aortic dilatation noted. 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   6. The inferior vena cava is normal in size with greater than 50%  respiratory variability, suggesting right atrial pressure of 3 mmHg  Patient Profile     75 y.o. female with hx of  HTN, autoimmune hepatitis and tobacco use who presented to [facility] with chest pain found to have elevated troponin now transferred to [facility] for NSTEM.   Assessment & Plan    #NSTEMI: Patient with ongoing intermittent chest pain that initially occurred with exertion but then progressed to occurring at rest prompting her to go to  [facility] ER. There trop 1217>1359>>1120. ECG with NSR with <0.70mm anterolateral STD consistent with NSTEMI.  -Chest pain free on heparin gtt and nitro gtt - Echo with hyperdynamic LVEF -Continue ASA 81mg  daily -Continue metop 50mg  XL in the AM -Not on statin, will need clearance with GI given liver fibrosis with AI hepatitis - For cath today   #HTN: -On nitro gtt currently -Continue metop 50mg  XL daily -Plan for ACE/ARB post-cath -Will likely need multiple agents for BP control as systolics [id number] on arrival   #Autoimmune hepatitis: -Continue azathioprine 75 mg daily   For questions or updates, please contact [facility] Please consult [email or web address] for contact info under        Signed, Leanor Kail, PA  09/01/2021, 10:29 AM      Pt just back from cath lab. I personally reviewed the cath images; full cath note pending. Patient is very anxious.NO chest pain.  BP elevated at 170/100. Agree with Dr. Darnelle Bos recommendation with severe multivessel CAD will have surgical consultation for CABG. Will add amlodipine 5 mg and transition or oral nitrates. Continue metoprolol succinate at present dose and titrate as needed.    Troy Sine, MD, [facility] 09/01/2021 11:35 AM

## 2021-09-02 ENCOUNTER — Inpatient Hospital Stay (HOSPITAL_COMMUNITY): Payer: Medicare HMO

## 2021-09-02 DIAGNOSIS — I214 Non-ST elevation (NSTEMI) myocardial infarction: Secondary | ICD-10-CM | POA: Diagnosis not present

## 2021-09-02 DIAGNOSIS — Z720 Tobacco use: Secondary | ICD-10-CM

## 2021-09-02 DIAGNOSIS — E785 Hyperlipidemia, unspecified: Secondary | ICD-10-CM | POA: Diagnosis not present

## 2021-09-02 DIAGNOSIS — Z01810 Encounter for preprocedural cardiovascular examination: Secondary | ICD-10-CM

## 2021-09-02 DIAGNOSIS — I25118 Atherosclerotic heart disease of native coronary artery with other forms of angina pectoris: Secondary | ICD-10-CM

## 2021-09-02 DIAGNOSIS — I25110 Atherosclerotic heart disease of native coronary artery with unstable angina pectoris: Secondary | ICD-10-CM

## 2021-09-02 DIAGNOSIS — I6529 Occlusion and stenosis of unspecified carotid artery: Secondary | ICD-10-CM

## 2021-09-02 LAB — CBC
HCT: 39.8 % (ref 36.0–46.0)
Hemoglobin: 13.6 g/dL (ref 12.0–15.0)
MCH: 32.7 pg (ref 26.0–34.0)
MCHC: 34.2 g/dL (ref 30.0–36.0)
MCV: 95.7 fL (ref 80.0–100.0)
Platelets: 155 10*3/uL (ref 150–400)
RBC: 4.16 MIL/uL (ref 3.87–5.11)
RDW: 13.2 % (ref 11.5–15.5)
WBC: 6.5 10*3/uL (ref 4.0–10.5)
nRBC: 0 % (ref 0.0–0.2)

## 2021-09-02 LAB — HEPATIC FUNCTION PANEL
ALT: 15 U/L (ref 0–44)
AST: 25 U/L (ref 15–41)
Albumin: 3.3 g/dL — ABNORMAL LOW (ref 3.5–5.0)
Alkaline Phosphatase: 37 U/L — ABNORMAL LOW (ref 38–126)
Bilirubin, Direct: 0.1 mg/dL (ref 0.0–0.2)
Indirect Bilirubin: 0.7 mg/dL (ref 0.3–0.9)
Total Bilirubin: 0.8 mg/dL (ref 0.3–1.2)
Total Protein: 6.1 g/dL — ABNORMAL LOW (ref 6.5–8.1)

## 2021-09-02 LAB — BASIC METABOLIC PANEL
Anion gap: 9 (ref 5–15)
BUN: 19 mg/dL (ref 8–23)
CO2: 19 mmol/L — ABNORMAL LOW (ref 22–32)
Calcium: 8.5 mg/dL — ABNORMAL LOW (ref 8.9–10.3)
Chloride: 107 mmol/L (ref 98–111)
Creatinine, Ser: 0.76 mg/dL (ref 0.44–1.00)
GFR, Estimated: >60 mL/min (ref >60)
Glucose, Bld: 88 mg/dL (ref 70–99)
Potassium: 3.9 mmol/L (ref 3.5–5.1)
Sodium: 135 mmol/L (ref 135–145)

## 2021-09-02 LAB — PULMONARY FUNCTION TEST
DL/VA % pred: 90 %
DL/VA: 3.73 ml/min/mmHg/L
DLCO cor % pred: 72 %
DLCO cor: 13.45 ml/min/mmHg
DLCO unc % pred: 72 %
DLCO unc: 13.53 ml/min/mmHg
FEF 25-75 Post: 1.52 L/sec
FEF 25-75 Pre: 1.34 L/sec
FEF2575-%Change-Post: 13 %
FEF2575-%Pred-Post: 91 %
FEF2575-%Pred-Pre: 80 %
FEV1-%Change-Post: 3 %
FEV1-%Pred-Post: 83 %
FEV1-%Pred-Pre: 80 %
FEV1-Post: 1.72 L
FEV1-Pre: 1.66 L
FEV1FVC-%Change-Post: 0 %
FEV1FVC-%Pred-Pre: 101 %
FEV6-%Change-Post: -1 %
FEV6-%Pred-Post: 82 %
FEV6-%Pred-Pre: 84 %
FEV6-Post: 2.15 L
FEV6-Pre: 2.18 L
FEV6FVC-%Pred-Post: 105 %
FEV6FVC-%Pred-Pre: 105 %
FVC-%Change-Post: 4 %
FVC-%Pred-Post: 83 %
FVC-%Pred-Pre: 80 %
FVC-Post: 2.28 L
FVC-Pre: 2.18 L
Post FEV1/FVC ratio: 75 %
Post FEV6/FVC ratio: 100 %
Pre FEV1/FVC ratio: 76 %
Pre FEV6/FVC Ratio: 100 %
RV % pred: 102 %
RV: 2.26 L
TLC % pred: 94 %
TLC: 4.62 L

## 2021-09-02 LAB — HEPARIN LEVEL (UNFRACTIONATED): Heparin Unfractionated: 0.43 IU/mL (ref 0.30–0.70)

## 2021-09-02 MED ORDER — ALPRAZOLAM 0.5 MG PO TABS
0.5000 mg | ORAL_TABLET | Freq: Three times a day (TID) | ORAL | Status: DC | PRN
  Administered 2021-09-02 – 2021-09-13 (×20): 0.5 mg via ORAL
  Filled 2021-09-02 (×22): qty 1

## 2021-09-02 MED ORDER — NICOTINE 14 MG/24HR TD PT24
14.0000 mg | MEDICATED_PATCH | Freq: Every day | TRANSDERMAL | Status: DC
  Filled 2021-09-02 (×7): qty 1

## 2021-09-02 MED ORDER — ALBUTEROL SULFATE (2.5 MG/3ML) 0.083% IN NEBU
2.5000 mg | INHALATION_SOLUTION | Freq: Once | RESPIRATORY_TRACT | Status: AC
  Administered 2021-09-02: 2.5 mg via RESPIRATORY_TRACT

## 2021-09-02 NOTE — Progress Notes (Signed)
Carotid duplex bilateral study completed.   Please see CV Proc for preliminary results.   Rogerick Baldwin, RDMS, RVT  

## 2021-09-02 NOTE — Progress Notes (Addendum)
Progress Note  Patient Name: [name] [name] Date of Encounter: 09/02/2021  [facility] Cardiologist: Freada Bergeron, MD   Subjective   Feeling well. No chest pain, sob or palpitations.   Inpatient Medications    Scheduled Meds:  amLODipine  5 mg Oral Daily   aspirin EC  81 mg Oral Daily   isosorbide mononitrate  30 mg Oral Daily   metoprolol succinate  50 mg Oral Daily   sodium chloride flush  3 mL Intravenous Q12H   Continuous Infusions:  sodium chloride     heparin 850 Units/hr (09/02/21 0040)   PRN Meds: sodium chloride, acetaminophen, nitroGLYCERIN, ondansetron (ZOFRAN) IV, sodium chloride flush   Vital Signs    Vitals:   09/01/21 2311 09/02/21 0340 09/02/21 0744 09/02/21 0800  BP: 123/69 129/75 (!) 148/87   Pulse:  64 74   Resp: 20 16 18    Temp: 97.9 F (36.6 C) 97.8 F (36.6 C) 97.9 F (36.6 C)   TempSrc: Oral Oral Oral   SpO2: 95% 96% 96% 100%  Weight:      Height:        Intake/Output Summary (Last 24 hours) at 09/02/2021 0952 Last data filed at 09/02/2021 0343 Gross per 24 hour  Intake 766.35 ml  Output 1000 ml  Net -233.65 ml   Last 3 Weights 08/30/2021 06/02/2018  Weight (lbs) 125 lb 125 lb  Weight (kg) 56.7 kg 56.7 kg      Telemetry    NSR - Personally Reviewed  ECG    N./A  Physical Exam   GEN: No acute distress.   Neck: No JVD Cardiac: RRR, no murmurs, rubs, or gallops.  Respiratory: Clear to auscultation bilaterally. Radial cath site without hematoma.  GI: Soft, nontender, non-distended  MS: No edema; No deformity. Neuro:  Nonfocal  Psych: Normal affect   Labs    High Sensitivity Troponin:   Recent Labs  Lab 08/30/21 1202 08/30/21 1410 08/31/21 1004  TROPONINIHS 1,217* 1,359* 1,120*     Chemistry Recent Labs  Lab 08/31/21 0052 09/01/21 0102 09/02/21 0331  NA 135 135 135  K 3.3* 3.8 3.9  CL 103 107 107  CO2 22 20* 19*  GLUCOSE 104* 90 88  BUN 18 23 19   CREATININE 0.77 0.73 0.76  CALCIUM 8.9 8.6*  8.5*  PROT  --   --  6.1*  ALBUMIN  --   --  3.3*  AST  --   --  25  ALT  --   --  15  ALKPHOS  --   --  37*  BILITOT  --   --  0.8  GFRNONAA >60 >60 >60  ANIONGAP 10 8 9     Lipids  Recent Labs  Lab 08/31/21 0052  CHOL 205*  TRIG 54  HDL 49  LDLCALC 145*  CHOLHDL 4.2    Hematology Recent Labs  Lab 08/31/21 0052 09/01/21 0102 09/02/21 0331  WBC 6.7 7.2 6.5  RBC 4.54 4.36 4.16  HGB 14.8 14.1 13.6  HCT 42.2 41.1 39.8  MCV 93.0 94.3 95.7  MCH 32.6 32.3 32.7  MCHC 35.1 34.3 34.2  RDW 13.2 13.2 13.2  PLT 184 177 155   Thyroid  Recent Labs  Lab 08/31/21 0052  TSH 4.625*     Radiology    CARDIAC CATHETERIZATION  Result Date: 09/01/2021 Conclusions: Severe three-vessel coronary artery disease, as detailed below. Mildly elevated left ventricular filling pressure. Recommendations: Cardiac surgery consultation for CABG. Restart IV heparin 2 hours  after TR band removal. Start isosorbide mononitrate and wean off nitroglycerin infusion, as tolerated. Aggressive secondary prevention of coronary artery disease. Yvonne Kendall, MD [facility]  ECHOCARDIOGRAM COMPLETE  Result Date: 08/31/2021    ECHOCARDIOGRAM REPORT   Patient Name:   [name] [name] Date of Exam: 08/31/2021 Medical Rec #:  [id number]         Height:       63.0 in Accession #:    [id number]        Weight:       125.0 lb Date of Birth:  11/12/1945         BSA:          1.584 m Patient Age:    75 years          BP:           120/91 mmHg Patient Gender: F                 HR:           83 bpm. Exam Location:  Inpatient Procedure: 2D Echo, Cardiac Doppler and Color Doppler Indications:    NSTEMI I21.4  History:        Patient has no prior history of Echocardiogram examinations.                 Signs/Symptoms:Chest Pain and Shortness of Breath.  Sonographer:    Roosvelt Maser RDCS Referring Phys: [id number] MATTHEW A CARLISLE IMPRESSIONS  1. Left ventricular ejection fraction, by estimation, is 70 to 75%. The left ventricle  has hyperdynamic function. The left ventricle has no regional wall motion abnormalities. There is moderate concentric left ventricular hypertrophy. Left ventricular diastolic parameters are consistent with Grade I diastolic dysfunction (impaired relaxation).  2. Right ventricular systolic function is normal. The right ventricular size is normal. Tricuspid regurgitation signal is inadequate for assessing PA pressure.  3. The mitral valve is normal in structure. Trivial mitral valve regurgitation.  4. The aortic valve is tricuspid. There is mild calcification of the aortic valve. There is mild thickening of the aortic valve. Aortic valve regurgitation is trivial.  5. Aortic dilatation noted. 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  6. The inferior vena cava is normal in size with greater than 50% respiratory variability, suggesting right atrial pressure of 3 mmHg. Comparison(s): No prior Echocardiogram. FINDINGS  Left Ventricle: Left ventricular ejection fraction, by estimation, is 70 to 75%. The left ventricle has hyperdynamic function. The left ventricle has no regional wall motion abnormalities. The left ventricular internal cavity size was normal in size. There is moderate concentric left ventricular hypertrophy. Left ventricular diastolic parameters are consistent with Grade I diastolic dysfunction (impaired relaxation). Right Ventricle: The right ventricular size is normal. No increase in right ventricular wall thickness. Right ventricular systolic function is normal. Tricuspid regurgitation signal is inadequate for assessing PA pressure. Left Atrium: Left atrial size was normal in size. Right Atrium: Right atrial size was normal in size. Pericardium: There is no evidence of pericardial effusion. Mitral Valve: The mitral valve is normal in structure. There is mild thickening of the mitral valve leaflet(s). There is mild calcification of the mitral valve leaflet(s). Mild mitral annular  calcification. Trivial mitral valve regurgitation. Tricuspid Valve: The tricuspid valve is normal in structure. Tricuspid valve regurgitation is trivial. Aortic Valve: The aortic valve is tricuspid. There is mild calcification of the aortic valve. There is mild thickening of the aortic valve. Aortic valve regurgitation is  trivial. Pulmonic Valve: The pulmonic valve was not well visualized. Pulmonic valve regurgitation is trivial. Aorta: Aortic dilatation noted. 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 Venous: The inferior vena cava is normal in size with greater than 50% respiratory variability, suggesting right atrial pressure of 3 mmHg. IAS/Shunts: No atrial level shunt detected by color flow Doppler.  LEFT VENTRICLE PLAX 2D LVIDd:         3.90 cm   Diastology LVIDs:         2.30 cm   LV e' medial:    4.68 cm/s LV PW:         1.30 cm   LV E/e' medial:  12.9 LV IVS:        1.20 cm   LV e' lateral:   8.49 cm/s LVOT diam:     2.00 cm   LV E/e' lateral: 7.1 LV SV:         56 LV SV Index:   35 LVOT Area:     3.14 cm  RIGHT VENTRICLE RV Basal diam:  2.30 cm LEFT ATRIUM             Index        RIGHT ATRIUM           Index LA diam:        2.40 cm 1.52 cm/m   RA Area:     12.30 cm LA Vol (A2C):   34.5 ml 21.79 ml/m  RA Volume:   29.80 ml  18.82 ml/m LA Vol (A4C):   27.5 ml 17.37 ml/m LA Biplane Vol: 32.9 ml 20.78 ml/m  AORTIC VALVE LVOT Vmax:   99.00 cm/s LVOT Vmean:  64.500 cm/s LVOT VTI:    0.178 m  AORTA Ao Root diam: 3.50 cm Ao Asc diam:  4.10 cm MITRAL VALVE MV Area (PHT): 2.99 cm     SHUNTS MV Decel Time: 254 msec     Systemic VTI:  0.18 m MV E velocity: 60.30 cm/s   Systemic Diam: 2.00 cm MV A velocity: 108.00 cm/s MV E/A ratio:  0.56 Gwyndolyn Kaufman MD Electronically signed by Gwyndolyn Kaufman MD Signature Date/Time: 08/31/2021/1:13:30 PM    Final     Cardiac Studies   Echo 08/31/2021 1. Left ventricular ejection fraction, by estimation, is 70 to 75%. The  left ventricle has  hyperdynamic function. The left ventricle has no  regional wall motion abnormalities. There is moderate concentric left  ventricular hypertrophy. Left ventricular  diastolic parameters are consistent with Grade I diastolic dysfunction  (impaired relaxation).   2. Right ventricular systolic function is normal. The right ventricular  size is normal. Tricuspid regurgitation signal is inadequate for assessing  PA pressure.   3. The mitral valve is normal in structure. Trivial mitral valve  regurgitation.   4. The aortic valve is tricuspid. There is mild calcification of the  aortic valve. There is mild thickening of the aortic valve. Aortic valve  regurgitation is trivial.   5. Aortic dilatation noted. 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   6. The inferior vena cava is normal in size with greater than 50%  respiratory variability, suggesting right atrial pressure of 3 mmHg  LEFT HEART CATH AND CORONARY ANGIOGRAPHY 09/01/21   Conclusion  Conclusions: Severe three-vessel coronary artery disease, as detailed below. Mildly elevated left ventricular filling pressure.   Recommendations: Cardiac surgery consultation for CABG. Restart IV heparin 2 hours after TR band  removal. Start isosorbide mononitrate and wean off nitroglycerin infusion, as tolerated. Aggressive secondary prevention of coronary artery disease.  Diagnostic Dominance: Right   Patient Profile     75 y.o. female with hx of  HTN, autoimmune hepatitis and tobacco use who presented to [facility] with chest pain found to have elevated troponin now transferred to [facility] for NSTEM.   Assessment & Plan    #NSTEMI/CAD Patient with ongoing intermittent chest pain that initially occurred with exertion but then progressed to occurring at rest prompting her to go to [facility] ER. There trop 1217>1359>>1120. ECG with NSR with <0.67mm anterolateral STD consistent with NSTEMI.  -Chest pain free on heparin gtt  and nitro gtt - Echo with hyperdynamic LVEF - Cath showed 3 V CAD>> seen by Dr. Laneta Simmers and CABG planned tentatively on Monday. Pending oral teeth eval.  -Continue ASA 81mg  daily -Continue metoprolol 50mg  XL  - Not a statin candidate     #HTN: -BP relatively stable on Toprol, Imdur and Amlodipine  - Titrate up as needed    #Autoimmune hepatitis: -Held azathioprine 75 mg daily  (see surgical consult note)  # HLD - 08/31/2021: Cholesterol 205; HDL 49; LDL Cholesterol 145; Triglycerides 54; VLDL 11  - Not a statin candidate given hx of liver fibrosis with AI hepatitis>> discussed with Dr. Hilty>> would be okay to use PCSK9 inhibitor but avoid statin >> outpatient referral to lipid clinic  For questions or updates, please contact [facility] Please consult [email or web address] for contact info under        Signed, , PA  09/02/2021, 9:52 AM     Pt undergoing pulmonary testing, No recurrent chest pain. HR in the 90s on metoprolol succinate 50 mg, and she is on amlodipine 5 mg and isosorbide 30 mg.  [place] discussion with sister. Appreciate Dr. Manson Passey evaluation. Will need tooth extraction. Azathioprine on hold with plan for Dr. 14/02/2021 to check with Dr. Sharee Pimple at [facility]. Tentatively12 scheduled for CABG on Monday, 09/08/2021.  Tuesday, MD, [facility] 09/02/2021 10:52 AM

## 2021-09-02 NOTE — Progress Notes (Signed)
Mobility Specialist: Progress Note   09/02/21 1250  Mobility  Activity Ambulated in hall  Level of Assistance Independent  Assistive Device None  Distance Ambulated (ft) 470 ft  Mobility Ambulated independently in hallway  Mobility Response Tolerated well  Mobility performed by Mobility specialist  Bed Position Chair  $Mobility charge 1 Mobility   Post-Mobility: 86 HR  Pt independent with ambulation with no c/o. Pt to recliner after walk with call bell in reach.  [facility] Kelby Adell Mobility Specialist Mobility Specialist Phone #1: [phone] Mobility Specialist Phone #2: [phone]

## 2021-09-02 NOTE — Progress Notes (Signed)
1 Day Post-Op Procedure(s) (LRB): LEFT HEART CATH AND CORONARY ANGIOGRAPHY (N/A) Subjective: No chest pain or shortness of breath. Ambulating in halls.  Objective: Vital signs in last 24 hours: Temp:  [97.4 F (36.3 C)-98 F (36.7 C)] 97.5 F (36.4 C) (12/06 1547) Pulse Rate:  [64-91] 81 (12/06 1547) Cardiac Rhythm: Normal sinus rhythm (12/06 0703) Resp:  [16-20] 18 (12/06 1547) BP: (104-150)/(65-90) 104/86 (12/06 1547) SpO2:  [95 %-100 %] 98 % (12/06 1547)  Hemodynamic parameters for last 24 hours:    Intake/Output from previous day: 12/05 0701 - 12/06 0700 In: 766.4 [P.O.:480; I.V.:286.4] Out: 1000 [Urine:1000] Intake/Output this shift: No intake/output data recorded.  General appearance: alert and cooperative Heart: regular rate and rhythm, S1, S2 normal, no murmur Lungs: clear to auscultation bilaterally  Lab Results: Recent Labs    09/01/21 0102 09/02/21 0331  WBC 7.2 6.5  HGB 14.1 13.6  HCT 41.1 39.8  PLT 177 155   BMET:  Recent Labs    09/01/21 0102 09/02/21 0331  NA 135 135  K 3.8 3.9  CL 107 107  CO2 20* 19*  GLUCOSE 90 88  BUN 23 19  CREATININE 0.73 0.76  CALCIUM 8.6* 8.5*    PT/INR: No results for input(s): LABPROT, INR in the last 72 hours. ABG No results found for: PHART, HCO3, TCO2, ACIDBASEDEF, O2SAT CBG (last 3)  No results for input(s): GLUCAP in the last 72 hours.  Assessment/Plan:  Severe multi-vessel CAD. Carotid dopplers show 40-59% RICA stenosis which is not significant. PFT's show mild obstructive disease. Plan CABG on Monday. I stopped azathioprine and left message with her GI physician at [facility]. I discussed surgery again with patient and her sister and answered their questions. She is now in agreement to proceed and feels better about it although still anxious. She would like some medication for her anxiety so will put her on nicotine patch and xanax.  LOS: 3 days    Alleen Borne 09/02/2021

## 2021-09-02 NOTE — Progress Notes (Signed)
In depth education with pt and sister. Discussed IS (1250 ml), sternal precautions, mobility post op, and d/c planning. Pt very receptive with many questions. She practiced mobility following sternal precautions perfectly. She likes to walk and I gave her permission to ambulate hall with her sister or staff as [place] as she is not having CP. Gave materials to read/review. She has questions regarding antianxiety meds before surgery.  [id number] Ethelda Chick CES, ACSM 12:49 PM 09/02/2021

## 2021-09-02 NOTE — Progress Notes (Signed)
Patient declined nicotine patch, wanted to try xanax instead. Stated, "I don't want to add something bad back in my system." Netta Corrigan, RN

## 2021-09-02 NOTE — Progress Notes (Signed)
ANTICOAGULATION CONSULT NOTE   Pharmacy Consult for Heparin Indication: chest pain/ACS  Allergies  Allergen Reactions   Budesonide Rash    Patient Measurements: Height: 5\' 3"  (160 cm) Weight: 56.7 kg (125 lb) IBW/kg (Calculated) : 52.4 Heparin Dosing Weight: 56.7 kg  Vital Signs: Temp: 97.9 F (36.6 C) (12/06 0744) Temp Source: Oral (12/06 0744) BP: 148/87 (12/06 0744) Pulse Rate: 74 (12/06 0744)  Labs: Recent Labs    08/30/21 1202 08/30/21 1410 08/30/21 2152 08/31/21 0052 08/31/21 0639 08/31/21 1004 08/31/21 1235 09/01/21 0102 09/02/21 0331  HGB 15.5*  --   --  14.8  --   --   --  14.1 13.6  HCT 45.3  --   --  42.2  --   --   --  41.1 39.8  PLT 224  --   --  184  --   --   --  177 155  LABPROT 12.1  --   --   --   --   --   --   --   --   INR 0.9  --   --   --   --   --   --   --   --   HEPARINUNFRC  --   --    < >  --    < >  --  0.35 0.36 0.43  CREATININE 0.78  --   --  0.77  --   --   --  0.73 0.76  TROPONINIHS 1,217* 1,359*  --   --   --  1,120*  --   --   --    < > = values in this interval not displayed.     Estimated Creatinine Clearance: 51 mL/min (by C-G formula based on SCr of 0.76 mg/dL).   Medical History: Past Medical History:  Diagnosis Date   Anxiety    Hepatitis     Medications:  Medications Prior to Admission  Medication Sig Dispense Refill Last Dose   azaTHIOprine (IMURAN) 50 MG tablet Take 75 mg by mouth daily.   08/30/2021   metoprolol succinate (TOPROL-XL) 50 MG 24 hr tablet Take 50 mg by mouth daily. Take with or immediately following a meal.   08/30/2021 at 0800   amoxicillin (AMOXIL) 500 MG capsule Take 500 mg by mouth 3 (three) times daily. (Patient not taking: Reported on 09/01/2021)   Completed Course    Scheduled:   amLODipine  5 mg Oral Daily   aspirin EC  81 mg Oral Daily   isosorbide mononitrate  30 mg Oral Daily   metoprolol succinate  50 mg Oral Daily   sodium chloride flush  3 mL Intravenous Q12H   Infusions:    sodium chloride     heparin 850 Units/hr (09/02/21 0040)   PRN: sodium chloride, acetaminophen, nitroGLYCERIN, ondansetron (ZOFRAN) IV, sodium chloride flush  Assessment: 75 yof presenting with chest pain. Heparin per pharmacy consult placed for chest pain/ACS. Patient is on not on anticoagulation prior to arrival. She is now s/p cath with plans for possible CABG  -heparin level at goal on 850 units/hr  Goal of Therapy:  Heparin level 0.3-0.7 units/ml Monitor platelets by anticoagulation protocol: Yes   Plan:  Continue heparin 850 units/hr  Daily heparin level and CBC  14/06/22, PharmD Clinical Pharmacist **Pharmacist phone directory can now be found on [email or web address] ([email or web address]).  Listed under [facility].

## 2021-09-02 NOTE — Progress Notes (Signed)
Pt walked full lap around the halls this am, standby assist. Tolerated well.

## 2021-09-02 NOTE — Progress Notes (Signed)
Mobility Specialist: Progress Note   09/02/21 1631  Mobility  Activity Ambulated in hall  Level of Assistance Independent  Assistive Device  (IV Pole)  Distance Ambulated (ft) 470 ft  Mobility Ambulated independently in hallway  Mobility Response Tolerated well  Mobility performed by Mobility specialist  Bed Position Chair  $Mobility charge 1 Mobility   Post-Mobility: 78 HR  Pt ambulated independently with no c/o using IV Pole. Pt sitting in recliner after walk with call bell in reach. Family member present in the room.   [facility] Tedi Hughson Mobility Specialist Mobility Specialist Phone #1: [phone] Mobility Specialist Phone #2: [phone]

## 2021-09-03 DIAGNOSIS — Z720 Tobacco use: Secondary | ICD-10-CM | POA: Diagnosis not present

## 2021-09-03 DIAGNOSIS — E785 Hyperlipidemia, unspecified: Secondary | ICD-10-CM | POA: Diagnosis not present

## 2021-09-03 DIAGNOSIS — I214 Non-ST elevation (NSTEMI) myocardial infarction: Secondary | ICD-10-CM | POA: Diagnosis not present

## 2021-09-03 DIAGNOSIS — I25118 Atherosclerotic heart disease of native coronary artery with other forms of angina pectoris: Secondary | ICD-10-CM | POA: Diagnosis not present

## 2021-09-03 DIAGNOSIS — I6529 Occlusion and stenosis of unspecified carotid artery: Secondary | ICD-10-CM

## 2021-09-03 LAB — BASIC METABOLIC PANEL
Anion gap: 9 (ref 5–15)
BUN: 33 mg/dL — ABNORMAL HIGH (ref 8–23)
CO2: 19 mmol/L — ABNORMAL LOW (ref 22–32)
Calcium: 8.5 mg/dL — ABNORMAL LOW (ref 8.9–10.3)
Chloride: 106 mmol/L (ref 98–111)
Creatinine, Ser: 0.92 mg/dL (ref 0.44–1.00)
GFR, Estimated: >60 mL/min (ref >60)
Glucose, Bld: 86 mg/dL (ref 70–99)
Potassium: 4 mmol/L (ref 3.5–5.1)
Sodium: 134 mmol/L — ABNORMAL LOW (ref 135–145)

## 2021-09-03 LAB — CBC
HCT: 37.1 % (ref 36.0–46.0)
Hemoglobin: 12.5 g/dL (ref 12.0–15.0)
MCH: 32.1 pg (ref 26.0–34.0)
MCHC: 33.7 g/dL (ref 30.0–36.0)
MCV: 95.1 fL (ref 80.0–100.0)
Platelets: 141 10*3/uL — ABNORMAL LOW (ref 150–400)
RBC: 3.9 MIL/uL (ref 3.87–5.11)
RDW: 13.3 % (ref 11.5–15.5)
WBC: 6.1 10*3/uL (ref 4.0–10.5)
nRBC: 0 % (ref 0.0–0.2)

## 2021-09-03 LAB — HEPARIN LEVEL (UNFRACTIONATED): Heparin Unfractionated: 0.38 IU/mL (ref 0.30–0.70)

## 2021-09-03 MED ORDER — AMLODIPINE BESYLATE 5 MG PO TABS
5.0000 mg | ORAL_TABLET | Freq: Once | ORAL | Status: DC
  Filled 2021-09-03: qty 1

## 2021-09-03 MED ORDER — AMLODIPINE BESYLATE 10 MG PO TABS
10.0000 mg | ORAL_TABLET | Freq: Every day | ORAL | Status: DC
  Administered 2021-09-04 – 2021-09-07 (×4): 10 mg via ORAL
  Filled 2021-09-03 (×4): qty 1

## 2021-09-03 NOTE — Progress Notes (Addendum)
Progress Note  Patient Name: [name] [name] Date of Encounter: 09/03/2021  [facility] Cardiologist: Freada Bergeron, MD  Subjective   No chest pain overnight. Awaiting CABG  Inpatient Medications    Scheduled Meds:  amLODipine  5 mg Oral Daily   aspirin EC  81 mg Oral Daily   isosorbide mononitrate  30 mg Oral Daily   metoprolol succinate  50 mg Oral Daily   nicotine  14 mg Transdermal Daily   sodium chloride flush  3 mL Intravenous Q12H   Continuous Infusions:  sodium chloride     heparin 850 Units/hr (09/02/21 1924)   PRN Meds: sodium chloride, acetaminophen, ALPRAZolam, nitroGLYCERIN, ondansetron (ZOFRAN) IV, sodium chloride flush   Vital Signs    Vitals:   09/02/21 1935 09/02/21 2334 09/03/21 0408 09/03/21 0748  BP: 119/69 136/81 (!) 160/81 (!) 151/88  Pulse: 77 72 65 69  Resp: 16 19 12 20   Temp: (!) 97.5 F (36.4 C) 97.9 F (36.6 C) 98 F (36.7 C) (!) 97.3 F (36.3 C)  TempSrc: Oral Oral Oral Oral  SpO2: 97% 98% 98% 98%  Weight:      Height:        Intake/Output Summary (Last 24 hours) at 09/03/2021 1105 Last data filed at 09/03/2021 0409 Gross per 24 hour  Intake --  Output 500 ml  Net -500 ml   Last 3 Weights 08/30/2021 06/02/2018  Weight (lbs) 125 lb 125 lb  Weight (kg) 56.7 kg 56.7 kg      Telemetry    SR - Personally Reviewed  ECG    No new tracing  Physical Exam   GEN: No acute distress.   Neck: No JVD Cardiac: RRR, no murmurs, rubs, or gallops.  Respiratory: Clear to auscultation bilaterally. GI: Soft, nontender, non-distended  MS: No edema; No deformity. Neuro:  Nonfocal  Psych: Normal affect   Labs    High Sensitivity Troponin:   Recent Labs  Lab 08/30/21 1202 08/30/21 1410 08/31/21 1004  TROPONINIHS 1,217* 1,359* 1,120*     Chemistry Recent Labs  Lab 09/01/21 0102 09/02/21 0331 09/03/21 0048  NA 135 135 134*  K 3.8 3.9 4.0  CL 107 107 106  CO2 20* 19* 19*  GLUCOSE 90 88 86  BUN 23 19 33*   CREATININE 0.73 0.76 0.92  CALCIUM 8.6* 8.5* 8.5*  PROT  --  6.1*  --   ALBUMIN  --  3.3*  --   AST  --  25  --   ALT  --  15  --   ALKPHOS  --  37*  --   BILITOT  --  0.8  --   GFRNONAA >60 >60 >60  ANIONGAP 8 9 9     Lipids  Recent Labs  Lab 08/31/21 0052  CHOL 205*  TRIG 54  HDL 49  LDLCALC 145*  CHOLHDL 4.2    Hematology Recent Labs  Lab 09/01/21 0102 09/02/21 0331 09/03/21 0048  WBC 7.2 6.5 6.1  RBC 4.36 4.16 3.90  HGB 14.1 13.6 12.5  HCT 41.1 39.8 37.1  MCV 94.3 95.7 95.1  MCH 32.3 32.7 32.1  MCHC 34.3 34.2 33.7  RDW 13.2 13.2 13.3  PLT 177 155 141*   Thyroid  Recent Labs  Lab 08/31/21 0052  TSH 4.625*    BNPNo results for input(s): BNP, PROBNP in the last 168 hours.  DDimer No results for input(s): DDIMER in the last 168 hours.   Radiology    DG Orthopantogram  Result Date: 09/02/2021 CLINICAL DATA:  Preop for heart surgery. EXAM: ORTHOPANTOGRAM/PANORAMIC COMPARISON:  None. FINDINGS: Poor dentition is noted particularly involving the maxilla. No fracture or lytic lesion is noted. IMPRESSION: Poor dentition is noted.  No fracture or lytic lesion is noted. Electronically Signed   By: Marijo Conception M.D.   On: 09/02/2021 15:53   CARDIAC CATHETERIZATION  Result Date: 09/01/2021 Conclusions: Severe three-vessel coronary artery disease, as detailed below. Mildly elevated left ventricular filling pressure. Recommendations: Cardiac surgery consultation for CABG. Restart IV heparin 2 hours after TR band removal. Start isosorbide mononitrate and wean off nitroglycerin infusion, as tolerated. Aggressive secondary prevention of coronary artery disease. Nelva Bush, MD [facility]  VAS US DOPPLER PRE CABG  Result Date: 09/02/2021 PREOPERATIVE VASCULAR EVALUATION Patient Name:  [name] [name]  Date of Exam:   09/02/2021 Medical Rec #: [id number]          Accession #:    [id number] Date of Birth: [birth year]          Patient Gender: F Patient Age:   75 years  Exam Location:  [facility] Procedure:      VAS US DOPPLER PRE CABG Referring Phys: Gilford Raid --------------------------------------------------------------------------------  Indications:      Pre-CABG. Risk Factors:     Hypertension, current smoker, prior MI. Comparison Study: No prior studies. Performing Technologist: Darlin Coco RDMS RVT  Examination Guidelines: A complete evaluation includes B-mode imaging, spectral Doppler, color Doppler, and power Doppler as needed of all accessible portions of each vessel. Bilateral testing is considered an integral part of a complete examination. Limited examinations for reoccurring indications may be performed as noted.  Right Carotid Findings: +----------+--------+--------+--------+------------------+---------------------+           PSV cm/sEDV cm/sStenosisDescribe          Comments              +----------+--------+--------+--------+------------------+---------------------+ CCA Prox  70      14                                                      +----------+--------+--------+--------+------------------+---------------------+ CCA Distal60      12                                intimal thickening    +----------+--------+--------+--------+------------------+---------------------+ ICA Prox  200     57      40-59%  calcific,         Velocities may                                          heterogenous and  underestimate degree                                    hypoechoic        of stenosis due to  more proximal                                                             obstruction.          +----------+--------+--------+--------+------------------+---------------------+ ICA Mid   109     30                                                      +----------+--------+--------+--------+------------------+---------------------+ ICA Distal57      18                                                       +----------+--------+--------+--------+------------------+---------------------+ ECA       102                                                             +----------+--------+--------+--------+------------------+---------------------+ +----------+--------+-------+----------------+------------+           PSV cm/sEDV cmsDescribe        Arm Pressure +----------+--------+-------+----------------+------------+ [id number]            Multiphasic, WNL             +----------+--------+-------+----------------+------------+ +---------+--------+--+--------+--+---------+ VertebralPSV cm/s92EDV cm/s25Antegrade +---------+--------+--+--------+--+---------+ Left Carotid Findings: +----------+--------+--------+--------+--------+--------+           PSV cm/sEDV cm/sStenosisDescribeComments +----------+--------+--------+--------+--------+--------+ CCA Prox  84      13                               +----------+--------+--------+--------+--------+--------+ CCA Distal97      20                               +----------+--------+--------+--------+--------+--------+ ICA Prox  106     36      1-39%   calcific         +----------+--------+--------+--------+--------+--------+ ICA Distal54      16                      tortuous +----------+--------+--------+--------+--------+--------+ ECA       99      14                               +----------+--------+--------+--------+--------+--------+  +----------+--------+--------+----------------+------------+ SubclavianPSV cm/sEDV cm/sDescribe        Arm Pressure +----------+--------+--------+----------------+------------+           100             Multiphasic, WNL             +----------+--------+--------+----------------+------------+ +---------+--------+--+--------+--+---------+ VertebralPSV cm/s45EDV cm/s12Antegrade  +---------+--------+--+--------+--+---------+  ABI Findings: +--------+------------------+-----+---------+--------+ Right   Rt Pressure (mmHg)IndexWaveform Comment  +--------+------------------+-----+---------+--------+ [id number]  triphasic         +--------+------------------+-----+---------+--------+ PTA     151               1.24 triphasic         +--------+------------------+-----+---------+--------+ DP      123               1.01 triphasic         +--------+------------------+-----+---------+--------+ +--------+------------------+-----+---------+-------+ Left    Lt Pressure (mmHg)IndexWaveform Comment +--------+------------------+-----+---------+-------+ [id number]                    triphasic        +--------+------------------+-----+---------+-------+ PTA     153               1.25 triphasic        +--------+------------------+-----+---------+-------+ DP      135               1.11 triphasic        +--------+------------------+-----+---------+-------+  Right Doppler Findings: +--------+--------+-----+---------+--------+ Site    PressureIndexDoppler  Comments +--------+--------+-----+---------+--------+ [id number]          triphasic         +--------+--------+-----+---------+--------+ Radial               triphasic         +--------+--------+-----+---------+--------+ Ulnar                triphasic         +--------+--------+-----+---------+--------+  Left Doppler Findings: +--------+--------+-----+---------+--------+ Site    PressureIndexDoppler  Comments +--------+--------+-----+---------+--------+ [id number]          triphasic         +--------+--------+-----+---------+--------+ Radial               triphasic         +--------+--------+-----+---------+--------+ Ulnar                triphasic         +--------+--------+-----+---------+--------+  Summary: Right Carotid: Velocities in the right  ICA are consistent with a 40-59%                stenosis. Left Carotid: Velocities in the left ICA are consistent with a 1-39% stenosis. Vertebrals:  Bilateral vertebral arteries demonstrate antegrade flow. Subclavians: Normal flow hemodynamics were seen in bilateral subclavian              arteries. Right ABI: Resting right ankle-brachial index is within normal range. No evidence of significant right lower extremity arterial disease. Left ABI: Resting left ankle-brachial index is within normal range. No evidence of significant left lower extremity arterial disease. Right Upper Extremity: Doppler waveforms remain within normal limits with right radial compression. Doppler waveforms remain within normal limits with right ulnar compression. Left Upper Extremity: Doppler waveforms remain within normal limits with left radial compression. Doppler waveforms remain within normal limits with left ulnar compression.  Electronically signed by Deitra Mayo MD on 09/02/2021 at 2:27:11 PM.    Final     Cardiac Studies   Echo 08/31/2021 1. Left ventricular ejection fraction, by estimation, is 70 to 75%. The  left ventricle has hyperdynamic function. The left ventricle has no  regional wall motion abnormalities. There is moderate concentric left  ventricular hypertrophy. Left ventricular  diastolic parameters are consistent with Grade I diastolic dysfunction  (impaired relaxation).   2. Right ventricular systolic function is normal. The right ventricular  size is normal. Tricuspid regurgitation signal is inadequate for assessing  PA pressure.   3. The mitral valve is normal in structure. Trivial mitral valve  regurgitation.   4. The aortic valve is tricuspid. There is mild calcification of the  aortic valve. There is mild thickening of the aortic valve. Aortic valve  regurgitation is trivial.   5. Aortic dilatation noted. 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   6. The  inferior vena cava is normal in size with greater than 50%  respiratory variability, suggesting right atrial pressure of 3 mmHg   LEFT HEART CATH AND CORONARY ANGIOGRAPHY 09/01/21    Conclusion   Conclusions: Severe three-vessel coronary artery disease, as detailed below. Mildly elevated left ventricular filling pressure.   Recommendations: Cardiac surgery consultation for CABG. Restart IV heparin 2 hours after TR band removal. Start isosorbide mononitrate and wean off nitroglycerin infusion, as tolerated. Aggressive secondary prevention of coronary artery disease.  Diagnostic Dominance: Right    Patient Profile     75 y.o. female hx of  HTN, autoimmune hepatitis and tobacco use who presented to [facility] with chest pain found to have elevated troponin now transferred to [facility] for NSTEMI.   Assessment & Plan    Non-STEMI: High-sensitivity troponin peaked at 1359.  Underwent cardiac catheterization noted above with severe three-vessel disease.  She was evaluated by [facility] C[facility], Dr. Laneta Simmers with plans for CABG on Monday.  No recurrent chest pain.  Echo with hyperdynamic LV function. --Continue aspirin, metoprolol 50 mg daily  Hypertension: Blood pressures remain elevated --Further increase amlodipine to 10 mg daily, continue metoprolol XL 50 mg  Autoimmune hepatitis: Held azathioprine 75 mg daily (see surgical consult note)  Hyperlipidemia: LDL 145, HDL 49 --She is not a statin candidate given her history of liver fibrosis with AI hepatitis --We will plan for lipid clinic referral as an outpatient  Dental disease: Pending eval  Tobacco use: cessation advised  For questions or updates, please contact [facility] Please consult [email or web address] for contact info under        Signed, Laverda Page, NP  09/03/2021, 11:05 AM     Patient seen and examined. Agree with assessment and plan. [place] discussion with patient. No recurrent chest pain on heparin. Mild -mod carotid disease.  Anxiety improved with alprazalam PRN. Poor dentition on dental evaluation. Probable initiation of PCSK9 inhibition as outpatient. Awaiting CABG on Monday with Dr. Laneta Simmers.   Lennette Bihari, MD, [facility] 09/03/2021 12:37 PM

## 2021-09-03 NOTE — Progress Notes (Signed)
ANTICOAGULATION CONSULT NOTE   Pharmacy Consult for Heparin Indication: chest pain/ACS  Allergies  Allergen Reactions   Budesonide Rash    Patient Measurements: Height: 5\' 3"  (160 cm) Weight: 56.7 kg (125 lb) IBW/kg (Calculated) : 52.4 Heparin Dosing Weight: 56.7 kg  Vital Signs: Temp: 97.3 F (36.3 C) (12/07 0748) Temp Source: Oral (12/07 0748) BP: 151/88 (12/07 0748) Pulse Rate: 69 (12/07 0748)  Labs: Recent Labs    08/31/21 1004 08/31/21 1235 09/01/21 0102 09/02/21 0331 09/03/21 0048  HGB  --    < > 14.1 13.6 12.5  HCT  --   --  41.1 39.8 37.1  PLT  --   --  177 155 141*  HEPARINUNFRC  --    < > 0.36 0.43 0.38  CREATININE  --   --  0.73 0.76 0.92  TROPONINIHS 1,120*  --   --   --   --    < > = values in this interval not displayed.     Estimated Creatinine Clearance: 44.4 mL/min (by C-G formula based on SCr of 0.92 mg/dL).   Medical History: Past Medical History:  Diagnosis Date   Anxiety    Hepatitis     Medications:  Medications Prior to Admission  Medication Sig Dispense Refill Last Dose   azaTHIOprine (IMURAN) 50 MG tablet Take 75 mg by mouth daily.   08/30/2021   metoprolol succinate (TOPROL-XL) 50 MG 24 hr tablet Take 50 mg by mouth daily. Take with or immediately following a meal.   08/30/2021 at 0800   amoxicillin (AMOXIL) 500 MG capsule Take 500 mg by mouth 3 (three) times daily. (Patient not taking: Reported on 09/01/2021)   Completed Course    Scheduled:   amLODipine  5 mg Oral Daily   aspirin EC  81 mg Oral Daily   isosorbide mononitrate  30 mg Oral Daily   metoprolol succinate  50 mg Oral Daily   nicotine  14 mg Transdermal Daily   sodium chloride flush  3 mL Intravenous Q12H   Infusions:   sodium chloride     heparin 850 Units/hr (09/02/21 1924)   PRN: sodium chloride, acetaminophen, ALPRAZolam, nitroGLYCERIN, ondansetron (ZOFRAN) IV, sodium chloride flush  Assessment: [name] [name] presenting with chest pain. Heparin per pharmacy  consult placed for chest pain/ACS. Patient is on not on anticoagulation prior to arrival. She is now s/p cath with plans for CABG on 12/12 -heparin level at goal on 850 units/hr  Goal of Therapy:  Heparin level 0.3-0.7 units/ml Monitor platelets by anticoagulation protocol: Yes   Plan:  Continue heparin 850 units/hr  Daily heparin level and CBC  14/12, PharmD Clinical Pharmacist **Pharmacist phone directory can now be found on [email or web address] ([email or web address]).  Listed under [facility].

## 2021-09-03 NOTE — Care Management Important Message (Signed)
Important Message  Patient Details  Name: [name] [name] MRN: [id number] Date of Birth: [birth year]   Medicare Important Message Given:  Yes     Renie Ora 09/03/2021, 10:11 AM

## 2021-09-03 NOTE — Progress Notes (Signed)
Mobility Specialist: Progress Note   09/03/21 1108  Mobility  Activity Ambulated in hall  Level of Assistance Independent  Assistive Device  (IV Pole)  Distance Ambulated (ft) 470 ft  Mobility Ambulated independently in hallway  Mobility Response Tolerated well  Mobility performed by Mobility specialist  Bed Position Chair  $Mobility charge 1 Mobility   Pt had no c/o throughout ambulation. Pt to recliner after walk with call bell and phone in reach.   [facility] Tamu Golz Mobility Specialist Mobility Specialist Phone #1: [phone] Mobility Specialist Phone #2: [phone]

## 2021-09-04 DIAGNOSIS — Z720 Tobacco use: Secondary | ICD-10-CM | POA: Diagnosis not present

## 2021-09-04 DIAGNOSIS — I25118 Atherosclerotic heart disease of native coronary artery with other forms of angina pectoris: Secondary | ICD-10-CM | POA: Diagnosis not present

## 2021-09-04 DIAGNOSIS — E785 Hyperlipidemia, unspecified: Secondary | ICD-10-CM | POA: Diagnosis not present

## 2021-09-04 DIAGNOSIS — I214 Non-ST elevation (NSTEMI) myocardial infarction: Secondary | ICD-10-CM | POA: Diagnosis not present

## 2021-09-04 LAB — CBC
HCT: 35.3 % — ABNORMAL LOW (ref 36.0–46.0)
Hemoglobin: 12.1 g/dL (ref 12.0–15.0)
MCH: 32.7 pg (ref 26.0–34.0)
MCHC: 34.3 g/dL (ref 30.0–36.0)
MCV: 95.4 fL (ref 80.0–100.0)
Platelets: 143 10*3/uL — ABNORMAL LOW (ref 150–400)
RBC: 3.7 MIL/uL — ABNORMAL LOW (ref 3.87–5.11)
RDW: 13.2 % (ref 11.5–15.5)
WBC: 5.5 10*3/uL (ref 4.0–10.5)
nRBC: 0 % (ref 0.0–0.2)

## 2021-09-04 LAB — BASIC METABOLIC PANEL
Anion gap: 5 (ref 5–15)
BUN: 27 mg/dL — ABNORMAL HIGH (ref 8–23)
CO2: 23 mmol/L (ref 22–32)
Calcium: 8.9 mg/dL (ref 8.9–10.3)
Chloride: 108 mmol/L (ref 98–111)
Creatinine, Ser: 0.87 mg/dL (ref 0.44–1.00)
GFR, Estimated: >60 mL/min (ref >60)
Glucose, Bld: 96 mg/dL (ref 70–99)
Potassium: 4.6 mmol/L (ref 3.5–5.1)
Sodium: 136 mmol/L (ref 135–145)

## 2021-09-04 LAB — HEPARIN LEVEL (UNFRACTIONATED): Heparin Unfractionated: 0.37 IU/mL (ref 0.30–0.70)

## 2021-09-04 NOTE — Progress Notes (Signed)
CARDIAC REHAB PHASE I   Pt seen ambulating in hallway independently for multiple laps with IV pole. Pt denies CP, SOB, or dizziness. HR 90s. Encouraged continued mobility without overdoing it. Pt denies needs or concerns at this time. Will continue to follow.  Reynold Bowen, RN BSN 09/04/2021 9:08 AM

## 2021-09-04 NOTE — Progress Notes (Signed)
ANTICOAGULATION CONSULT NOTE   Pharmacy Consult for Heparin Indication: chest pain/ACS  Allergies  Allergen Reactions   Budesonide Rash    Patient Measurements: Height: 5\' 3"  (160 cm) Weight: 56.7 kg (125 lb) IBW/kg (Calculated) : 52.4 Heparin Dosing Weight: 56.7 kg  Vital Signs: Temp: 97.6 F (36.4 C) (12/08 0732) Temp Source: Oral (12/08 0732) BP: 148/87 (12/08 0732) Pulse Rate: 76 (12/08 0814)  Labs: Recent Labs    09/02/21 0331 09/03/21 0048 09/04/21 0154  HGB 13.6 12.5 12.1  HCT 39.8 37.1 35.3*  PLT 155 141* 143*  HEPARINUNFRC 0.43 0.38 0.37  CREATININE 0.76 0.92 0.87     Estimated Creatinine Clearance: 46.9 mL/min (by C-G formula based on SCr of 0.87 mg/dL).   Medical History: Past Medical History:  Diagnosis Date   Anxiety    Hepatitis     Medications:  Medications Prior to Admission  Medication Sig Dispense Refill Last Dose   azaTHIOprine (IMURAN) 50 MG tablet Take 75 mg by mouth daily.   08/30/2021   metoprolol succinate (TOPROL-XL) 50 MG 24 hr tablet Take 50 mg by mouth daily. Take with or immediately following a meal.   08/30/2021 at 0800   amoxicillin (AMOXIL) 500 MG capsule Take 500 mg by mouth 3 (three) times daily. (Patient not taking: Reported on 09/01/2021)   Completed Course    Scheduled:   amLODipine  10 mg Oral Daily   amLODipine  5 mg Oral Once   aspirin EC  81 mg Oral Daily   isosorbide mononitrate  30 mg Oral Daily   metoprolol succinate  50 mg Oral Daily   nicotine  14 mg Transdermal Daily   sodium chloride flush  3 mL Intravenous Q12H   Infusions:   sodium chloride     heparin 850 Units/hr (09/04/21 0058)   PRN: sodium chloride, acetaminophen, ALPRAZolam, nitroGLYCERIN, ondansetron (ZOFRAN) IV, sodium chloride flush  Assessment: 75 yof presenting with chest pain. Heparin per pharmacy consult placed for chest pain/ACS. Patient is on not on anticoagulation prior to arrival. She is now s/p cath with plans for CABG on  12/12 -heparin level at goal on 850 units/hr  Goal of Therapy:  Heparin level 0.3-0.7 units/ml Monitor platelets by anticoagulation protocol: Yes   Plan:  Continue heparin 850 units/hr  Daily heparin level and CBC  14/08/22, PharmD Clinical Pharmacist **Pharmacist phone directory can now be found on [email or web address] ([email or web address]).  Listed under [facility].

## 2021-09-04 NOTE — Progress Notes (Signed)
Progress Note  Patient Name: [name] [name] Date of Encounter: 09/04/2021  [facility] Cardiologist: Freada Bergeron, MD  Subjective   No recurrent chest pain on IV heparin;  CABG on Monday  Inpatient Medications    Scheduled Meds:  amLODipine  10 mg Oral Daily   aspirin EC  81 mg Oral Daily   isosorbide mononitrate  30 mg Oral Daily   metoprolol succinate  50 mg Oral Daily   nicotine  14 mg Transdermal Daily   sodium chloride flush  3 mL Intravenous Q12H   Continuous Infusions:  sodium chloride     heparin 850 Units/hr (09/04/21 0932)   PRN Meds: sodium chloride, acetaminophen, ALPRAZolam, nitroGLYCERIN, ondansetron (ZOFRAN) IV, sodium chloride flush   Vital Signs    Vitals:   09/04/21 0342 09/04/21 0732 09/04/21 0814 09/04/21 1052  BP: 140/78 (!) 148/87  119/76  Pulse: 60 61 76 69  Resp: 15 17  17   Temp: 97.6 F (36.4 C) 97.6 F (36.4 C)  97.7 F (36.5 C)  TempSrc: Oral Oral  Oral  SpO2: 97% 96%  97%  Weight:      Height:        Intake/Output Summary (Last 24 hours) at 09/04/2021 1202 Last data filed at 09/04/2021 0932 Gross per 24 hour  Intake 963.39 ml  Output 900 ml  Net 63.39 ml   Last 3 Weights 08/30/2021 06/02/2018  Weight (lbs) 125 lb 125 lb  Weight (kg) 56.7 kg 56.7 kg      Telemetry    SR - Personally Reviewed  ECG    No new tracing  Physical Exam   BP 119/76 (BP Location: Left Arm)   Pulse 69   Temp 97.7 F (36.5 C) (Oral)   Resp 17   Ht 5\' 3"  (1.6 m)   Wt 56.7 kg   SpO2 97%   BMI 22.14 kg/m  General: Alert, oriented, no distress.  Skin: normal turgor, no rashes, warm and dry HEENT: Normocephalic, atraumatic. Pupils equal round and reactive to light; sclera anicteric; extraocular muscles intact;  Nose without nasal septal hypertrophy Mouth/Parynx benign; Mallinpatti scale Neck: No JVD, no carotid bruits; normal carotid upstroke Lungs: clear to ausculatation and percussion; no wheezing or rales Chest wall: without  tenderness to palpitation Heart: PMI not displaced, RRR, s1 s2 normal, 1/6 systolic murmur, no diastolic murmur, no rubs, gallops, thrills, or heaves Abdomen: soft, nontender; no hepatosplenomehaly, BS+; abdominal aorta nontender and not dilated by palpation. Back: no CVA tenderness Pulses 2+ Musculoskeletal: full range of motion, normal strength, no joint deformities Extremities: no clubbing cyanosis or edema, Homan's sign negative  Neurologic: grossly nonfocal; Cranial nerves grossly wnl Psychologic: Normal mood and affect    Labs    High Sensitivity Troponin:   Recent Labs  Lab 08/30/21 1202 08/30/21 1410 08/31/21 1004  TROPONINIHS 1,217* 1,359* 1,120*     Chemistry Recent Labs  Lab 09/02/21 0331 09/03/21 0048 09/04/21 0154  NA 135 134* 136  K 3.9 4.0 4.6  CL 107 106 108  CO2 19* 19* 23  GLUCOSE 88 86 96  BUN 19 33* 27*  CREATININE 0.76 0.92 0.87  CALCIUM 8.5* 8.5* 8.9  PROT 6.1*  --   --   ALBUMIN 3.3*  --   --   AST 25  --   --   ALT 15  --   --   ALKPHOS 37*  --   --   BILITOT 0.8  --   --  GFRNONAA >60 >60 >60  ANIONGAP 9 9 5     Lipids  Recent Labs  Lab 08/31/21 0052  CHOL 205*  TRIG 54  HDL 49  LDLCALC 145*  CHOLHDL 4.2    Hematology Recent Labs  Lab 09/02/21 0331 09/03/21 0048 09/04/21 0154  WBC 6.5 6.1 5.5  RBC 4.16 3.90 3.70*  HGB 13.6 12.5 12.1  HCT 39.8 37.1 35.3*  MCV 95.7 95.1 95.4  MCH 32.7 32.1 32.7  MCHC 34.2 33.7 34.3  RDW 13.2 13.3 13.2  PLT 155 141* 143*   Thyroid  Recent Labs  Lab 08/31/21 0052  TSH 4.625*    BNPNo results for input(s): BNP, PROBNP in the last 168 hours.  DDimer No results for input(s): DDIMER in the last 168 hours.   Radiology    DG Orthopantogram  Result Date: 09/02/2021 CLINICAL DATA:  Preop for heart surgery. EXAM: ORTHOPANTOGRAM/PANORAMIC COMPARISON:  None. FINDINGS: Poor dentition is noted particularly involving the maxilla. No fracture or lytic lesion is noted. IMPRESSION: Poor dentition  is noted.  No fracture or lytic lesion is noted. Electronically Signed   By: Marijo Conception M.D.   On: 09/02/2021 15:53   VAS US DOPPLER PRE CABG  Result Date: 09/02/2021 PREOPERATIVE VASCULAR EVALUATION Patient Name:  [name] [name]  Date of Exam:   09/02/2021 Medical Rec #: [id number]          Accession #:    [id number] Date of Birth: 01/23/1946          Patient Gender: F Patient Age:   13 years Exam Location:  [facility] Procedure:      VAS US DOPPLER PRE CABG Referring Phys: Gilford Raid --------------------------------------------------------------------------------  Indications:      Pre-CABG. Risk Factors:     Hypertension, current smoker, prior MI. Comparison Study: No prior studies. Performing Technologist: Darlin Coco RDMS RVT  Examination Guidelines: A complete evaluation includes B-mode imaging, spectral Doppler, color Doppler, and power Doppler as needed of all accessible portions of each vessel. Bilateral testing is considered an integral part of a complete examination. Limited examinations for reoccurring indications may be performed as noted.  Right Carotid Findings: +----------+--------+--------+--------+------------------+---------------------+           PSV cm/sEDV cm/sStenosisDescribe          Comments              +----------+--------+--------+--------+------------------+---------------------+ CCA Prox  70      14                                                      +----------+--------+--------+--------+------------------+---------------------+ CCA Distal60      12                                intimal thickening    +----------+--------+--------+--------+------------------+---------------------+ ICA Prox  200     57      40-59%  calcific,         Velocities may                                          heterogenous and  underestimate degree  hypoechoic        of stenosis due to                                                         more proximal                                                             obstruction.          +----------+--------+--------+--------+------------------+---------------------+ ICA Mid   109     30                                                      +----------+--------+--------+--------+------------------+---------------------+ ICA Distal57      18                                                      +----------+--------+--------+--------+------------------+---------------------+ ECA       102                                                             +----------+--------+--------+--------+------------------+---------------------+ +----------+--------+-------+----------------+------------+           PSV cm/sEDV cmsDescribe        Arm Pressure +----------+--------+-------+----------------+------------+ [id number]            Multiphasic, WNL             +----------+--------+-------+----------------+------------+ +---------+--------+--+--------+--+---------+ VertebralPSV cm/s92EDV cm/s25Antegrade +---------+--------+--+--------+--+---------+ Left Carotid Findings: +----------+--------+--------+--------+--------+--------+           PSV cm/sEDV cm/sStenosisDescribeComments +----------+--------+--------+--------+--------+--------+ CCA Prox  84      13                               +----------+--------+--------+--------+--------+--------+ CCA Distal97      20                               +----------+--------+--------+--------+--------+--------+ ICA Prox  106     36      1-39%   calcific         +----------+--------+--------+--------+--------+--------+ ICA Distal54      16                      tortuous +----------+--------+--------+--------+--------+--------+ ECA       99      14                               +----------+--------+--------+--------+--------+--------+   +----------+--------+--------+----------------+------------+ SubclavianPSV cm/sEDV cm/sDescribe  Arm Pressure +----------+--------+--------+----------------+------------+           100             Multiphasic, WNL             +----------+--------+--------+----------------+------------+ +---------+--------+--+--------+--+---------+ VertebralPSV cm/s45EDV cm/s12Antegrade +---------+--------+--+--------+--+---------+  ABI Findings: +--------+------------------+-----+---------+--------+ Right   Rt Pressure (mmHg)IndexWaveform Comment  +--------+------------------+-----+---------+--------+ [id number]                    triphasic         +--------+------------------+-----+---------+--------+ PTA     151               1.24 triphasic         +--------+------------------+-----+---------+--------+ DP      123               1.01 triphasic         +--------+------------------+-----+---------+--------+ +--------+------------------+-----+---------+-------+ Left    Lt Pressure (mmHg)IndexWaveform Comment +--------+------------------+-----+---------+-------+ [id number]                    triphasic        +--------+------------------+-----+---------+-------+ PTA     153               1.25 triphasic        +--------+------------------+-----+---------+-------+ DP      135               1.11 triphasic        +--------+------------------+-----+---------+-------+  Right Doppler Findings: +--------+--------+-----+---------+--------+ Site    PressureIndexDoppler  Comments +--------+--------+-----+---------+--------+ [id number]          triphasic         +--------+--------+-----+---------+--------+ Radial               triphasic         +--------+--------+-----+---------+--------+ Ulnar                triphasic         +--------+--------+-----+---------+--------+  Left Doppler Findings: +--------+--------+-----+---------+--------+ Site     PressureIndexDoppler  Comments +--------+--------+-----+---------+--------+ [id number]          triphasic         +--------+--------+-----+---------+--------+ Radial               triphasic         +--------+--------+-----+---------+--------+ Ulnar                triphasic         +--------+--------+-----+---------+--------+  Summary: Right Carotid: Velocities in the 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 Left Carotid: Velocities in the left ICA are consistent with a 1-39% stenosis. Vertebrals:  Bilateral vertebral arteries demonstrate antegrade flow. Subclavians: Normal flow hemodynamics were seen in bilateral subclavian              arteries. Right ABI: Resting right ankle-brachial index is within normal range. No evidence of significant right lower extremity arterial disease. Left ABI: Resting left ankle-brachial index is within normal range. No evidence of significant left lower extremity arterial disease. Right Upper Extremity: Doppler waveforms remain within normal limits with right radial compression. Doppler waveforms remain within normal limits with right ulnar compression. Left Upper Extremity: Doppler waveforms remain within normal limits with left radial compression. Doppler waveforms remain within normal limits with left ulnar compression.  Electronically signed by Deitra Mayo MD on 09/02/2021 at 2:27:11 PM.    Final     Cardiac  Studies   Echo 08/31/2021 1. Left ventricular ejection fraction, by estimation, is 70 to 75%. The  left ventricle has hyperdynamic function. The left ventricle has no  regional wall motion abnormalities. There is moderate concentric left  ventricular hypertrophy. Left ventricular  diastolic parameters are consistent with Grade I diastolic dysfunction  (impaired relaxation).   2. Right ventricular systolic function is normal. The right ventricular  size is normal. Tricuspid regurgitation signal is inadequate for  assessing  PA pressure.   3. The mitral valve is normal in structure. Trivial mitral valve  regurgitation.   4. The aortic valve is tricuspid. There is mild calcification of the  aortic valve. There is mild thickening of the aortic valve. Aortic valve  regurgitation is trivial.   5. Aortic dilatation noted. 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   6. The inferior vena cava is normal in size with greater than 50%  respiratory variability, suggesting right atrial pressure of 3 mmHg   LEFT HEART CATH AND CORONARY ANGIOGRAPHY 09/01/21    Conclusion   Conclusions: Severe three-vessel coronary artery disease, as detailed below. Mildly elevated left ventricular filling pressure.   Recommendations: Cardiac surgery consultation for CABG. Restart IV heparin 2 hours after TR band removal. Start isosorbide mononitrate and wean off nitroglycerin infusion, as tolerated. Aggressive secondary prevention of coronary artery disease.  Diagnostic Dominance: Right    Patient Profile     75 y.o. female hx of  HTN, autoimmune hepatitis and tobacco use who presented to [facility] with chest pain found to have elevated troponin now transferred to [facility] for NSTEMI.   Assessment & Plan    Non-STEMI: High-sensitivity troponin peaked at 1359.  Underwent cardiac catheterization noted above with severe three-vessel disease.  She was evaluated by [facility], Dr. Cyndia Bent with plans for CABG on Monday.  No recurrent chest pain.  Echo with hyperdynamic LV function. --Continue aspirin, metoprolol 50 mg daily  Hypertension: Blood pressures improved --Now on  amlodipine to 10 mg daily, continue metoprolol XL 50 mg, imdur 30 mg  Autoimmune hepatitis: Held azathioprine 75 mg daily (see surgical consult note)  Hyperlipidemia: LDL 145, HDL 49 --She is not a statin candidate given her history of liver fibrosis with AI hepatitis - Add zetia 10 mg  Good candidate for PCSK as outpatient  Dental  disease: poor dentition on radiograph, loose tooth  Mild carotid disease  Tobacco use: cessation advised  Anxiety: Getting PRN aprazalam  For questions or updates, please contact [facility] Please consult [email or web address] for contact info under        Signed, Shelva Majestic, MD  09/04/2021, 12:02 PM

## 2021-09-04 NOTE — Progress Notes (Signed)
CARDIAC REHAB PHASE I   PRE:  Rate/Rhythm: 72 SR  BP:  Supine:   Sitting: 135/74  Standing: 96   SaO2: 96 RA  MODE:  Ambulation: 350 ft   POST:  Rate/Rhythm: 76 SR  BP:  Supine:   Sitting: 119/76  Standing:    SaO2: 95% RA Cautioned to only walk 4 times per day and short distances in the hall.  She is prone to over-doing.  She is very anxious about CABG and nervous. Allowed to verbalize her fears, tried to ease her fears. Able to demonstrate IS, only able to go to 700, stressed to use it multiple times per day.  Also, practiced getting in and out of the bed and chair and staying in the tube.  Asked for chaplain to see patient. [phone] Cindra Eves RN, BSN 09/04/2021 11:17 AM

## 2021-09-04 NOTE — Progress Notes (Signed)
Mobility Specialist: Progress Note   09/04/21 1649  Mobility  Activity Ambulated in hall  Level of Assistance Independent after set-up  Assistive Device  (IV Pole)  Distance Ambulated (ft) 470 ft  Mobility Ambulated independently in hallway  Mobility Response Tolerated well  Mobility performed by Mobility specialist  $Mobility charge 1 Mobility   Pt independent throughout ambulation. Pt back to bed after walk with no c/o. Pt has call bell and phone at her side.   [place] Hila Bolding Mobility Specialist Mobility Specialist Phone #1: [phone] Mobility Specialist Phone #2: [phone]

## 2021-09-04 NOTE — Plan of Care (Signed)
  Problem: Clinical Measurements: Goal: Will remain free from infection Outcome: Progressing Goal: Diagnostic test results will improve Outcome: Progressing   Problem: Health Behavior/Discharge Planning: Goal: Ability to manage health-related needs will improve Outcome: Not Progressing   

## 2021-09-05 DIAGNOSIS — I2510 Atherosclerotic heart disease of native coronary artery without angina pectoris: Secondary | ICD-10-CM

## 2021-09-05 LAB — HEPARIN LEVEL (UNFRACTIONATED): Heparin Unfractionated: 0.44 IU/mL (ref 0.30–0.70)

## 2021-09-05 MED ORDER — PHENYLEPHRINE HCL-NACL 20-0.9 MG/250ML-% IV SOLN
30.0000 ug/min | INTRAVENOUS | Status: AC
  Administered 2021-09-08: 50 ug/min via INTRAVENOUS
  Filled 2021-09-05: qty 250

## 2021-09-05 MED ORDER — MAGNESIUM SULFATE 50 % IJ SOLN
40.0000 meq | INTRAMUSCULAR | Status: DC
  Filled 2021-09-05: qty 9.85

## 2021-09-05 MED ORDER — NITROGLYCERIN IN D5W 200-5 MCG/ML-% IV SOLN
2.0000 ug/min | INTRAVENOUS | Status: DC
  Filled 2021-09-05: qty 250

## 2021-09-05 MED ORDER — MILRINONE LACTATE IN DEXTROSE 20-5 MG/100ML-% IV SOLN
0.3000 ug/kg/min | INTRAVENOUS | Status: DC
  Filled 2021-09-05: qty 100

## 2021-09-05 MED ORDER — CEFAZOLIN SODIUM-DEXTROSE 2-4 GM/100ML-% IV SOLN
2.0000 g | INTRAVENOUS | Status: AC
  Administered 2021-09-08 (×2): 2 g via INTRAVENOUS
  Filled 2021-09-05: qty 100

## 2021-09-05 MED ORDER — NOREPINEPHRINE 4 MG/250ML-% IV SOLN
0.0000 ug/min | INTRAVENOUS | Status: DC
  Filled 2021-09-05: qty 250

## 2021-09-05 MED ORDER — TRANEXAMIC ACID 1000 MG/10ML IV SOLN
1.5000 mg/kg/h | INTRAVENOUS | Status: AC
  Administered 2021-09-08: 1.5 mg/kg/h via INTRAVENOUS
  Filled 2021-09-05: qty 25

## 2021-09-05 MED ORDER — TRANEXAMIC ACID (OHS) BOLUS VIA INFUSION
15.0000 mg/kg | INTRAVENOUS | Status: AC
  Administered 2021-09-08: 850.5 mg via INTRAVENOUS
  Filled 2021-09-05: qty 851

## 2021-09-05 MED ORDER — DEXMEDETOMIDINE HCL IN NACL 400 MCG/100ML IV SOLN
0.1000 ug/kg/h | INTRAVENOUS | Status: AC
  Administered 2021-09-08: .3 ug/kg/h via INTRAVENOUS
  Filled 2021-09-05: qty 100

## 2021-09-05 MED ORDER — POTASSIUM CHLORIDE 2 MEQ/ML IV SOLN
80.0000 meq | INTRAVENOUS | Status: DC
  Filled 2021-09-05: qty 40

## 2021-09-05 MED ORDER — CEFAZOLIN SODIUM-DEXTROSE 2-4 GM/100ML-% IV SOLN
2.0000 g | INTRAVENOUS | Status: DC
  Filled 2021-09-05: qty 100

## 2021-09-05 MED ORDER — VANCOMYCIN HCL 1250 MG/250ML IV SOLN
1250.0000 mg | INTRAVENOUS | Status: AC
  Administered 2021-09-08: 1250 mg via INTRAVENOUS
  Filled 2021-09-05: qty 250

## 2021-09-05 MED ORDER — TRANEXAMIC ACID (OHS) PUMP PRIME SOLUTION
2.0000 mg/kg | INTRAVENOUS | Status: DC
  Filled 2021-09-05: qty 1.13

## 2021-09-05 MED ORDER — HEPARIN 30,000 UNITS/1000 ML (OHS) CELLSAVER SOLUTION
Status: DC
  Filled 2021-09-05: qty 1000

## 2021-09-05 MED ORDER — INSULIN REGULAR(HUMAN) IN NACL 100-0.9 UT/100ML-% IV SOLN
INTRAVENOUS | Status: AC
  Administered 2021-09-08: .6 [IU]/h via INTRAVENOUS
  Filled 2021-09-05: qty 100

## 2021-09-05 MED ORDER — EPINEPHRINE HCL 5 MG/250ML IV SOLN IN NS
0.0000 ug/min | INTRAVENOUS | Status: DC
  Filled 2021-09-05: qty 250

## 2021-09-05 MED ORDER — PLASMA-LYTE A IV SOLN
INTRAVENOUS | Status: DC
  Filled 2021-09-05: qty 5

## 2021-09-05 NOTE — Progress Notes (Addendum)
Progress Note  Patient Name: [name] [name] Date of Encounter: 09/05/2021  [facility] Cardiologist: Freada Bergeron, MD   Subjective   No complaints  Inpatient Medications    Scheduled Meds:  amLODipine  10 mg Oral Daily   aspirin EC  81 mg Oral Daily   [START ON 09/08/2021] epinephrine  0-10 mcg/min Intravenous To OR   [START ON 09/08/2021] heparin-papaverine-plasmalyte irrigation   Irrigation To OR   [START ON 09/08/2021] insulin   Intravenous To OR   isosorbide mononitrate  30 mg Oral Daily   [START ON 09/08/2021] magnesium sulfate  40 mEq Other To OR   metoprolol succinate  50 mg Oral Daily   nicotine  14 mg Transdermal Daily   [START ON 09/08/2021] phenylephrine  30-200 mcg/min Intravenous To OR   [START ON 09/08/2021] potassium chloride  80 mEq Other To OR   sodium chloride flush  3 mL Intravenous Q12H   [START ON 09/08/2021] tranexamic acid  15 mg/kg Intravenous To OR   [START ON 09/08/2021] tranexamic acid  2 mg/kg Intracatheter To OR   Continuous Infusions:  sodium chloride     [START ON 09/08/2021]  ceFAZolin (ANCEF) IV     [START ON 09/08/2021]  ceFAZolin (ANCEF) IV     [START ON 09/08/2021] dexmedetomidine     [START ON 09/08/2021] heparin 30,000 units/NS 1000 mL solution for CELLSAVER     heparin 850 Units/hr (09/05/21 0830)   [START ON 09/08/2021] milrinone     [START ON 09/08/2021] nitroGLYCERIN     [START ON 09/08/2021] norepinephrine     [START ON 09/08/2021] tranexamic acid (CYKLOKAPRON) infusion ([facility])     [START ON 09/08/2021] vancomycin     PRN Meds: sodium chloride, acetaminophen, ALPRAZolam, nitroGLYCERIN, ondansetron (ZOFRAN) IV, sodium chloride flush   Vital Signs    Vitals:   09/04/21 2343 09/05/21 0500 09/05/21 0735 09/05/21 0816  BP: 122/63 140/84 (!) 154/83   Pulse: 64 61 (!) 58 72  Resp: 20 18 20    Temp: 97.7 F (36.5 C) (!) 97.5 F (36.4 C) 97.6 F (36.4 C)   TempSrc: Oral Oral Oral   SpO2: 96% 97% 100%   Weight:       Height:        Intake/Output Summary (Last 24 hours) at 09/05/2021 1139 Last data filed at 09/05/2021 0830 Gross per 24 hour  Intake 754.01 ml  Output 800 ml  Net -45.99 ml   Last 3 Weights 08/30/2021 06/02/2018  Weight (lbs) 125 lb 125 lb  Weight (kg) 56.7 kg 56.7 kg      Telemetry    SR - Personally Reviewed  ECG    No new tracing  Physical Exam   GEN: No acute distress.   Neck: No JVD Cardiac: RRR, no murmurs, rubs, or gallops.  Respiratory: Clear to auscultation bilaterally. GI: Soft, nontender, non-distended  MS: No edema; No deformity. Neuro:  Nonfocal  Psych: Normal affect   Labs    High Sensitivity Troponin:   Recent Labs  Lab 08/30/21 1202 08/30/21 1410 08/31/21 1004  TROPONINIHS 1,217* 1,359* 1,120*     Chemistry Recent Labs  Lab 09/02/21 0331 09/03/21 0048 09/04/21 0154  NA 135 134* 136  K 3.9 4.0 4.6  CL 107 106 108  CO2 19* 19* 23  GLUCOSE 88 86 96  BUN 19 33* 27*  CREATININE 0.76 0.92 0.87  CALCIUM 8.5* 8.5* 8.9  PROT 6.1*  --   --   ALBUMIN 3.3*  --   --  AST 25  --   --   ALT 15  --   --   ALKPHOS 37*  --   --   BILITOT 0.8  --   --   GFRNONAA >60 >60 >60  ANIONGAP 9 9 5     Lipids  Recent Labs  Lab 08/31/21 0052  CHOL 205*  TRIG 54  HDL 49  LDLCALC 145*  CHOLHDL 4.2    Hematology Recent Labs  Lab 09/02/21 0331 09/03/21 0048 09/04/21 0154  WBC 6.5 6.1 5.5  RBC 4.16 3.90 3.70*  HGB 13.6 12.5 12.1  HCT 39.8 37.1 35.3*  MCV 95.7 95.1 95.4  MCH 32.7 32.1 32.7  MCHC 34.2 33.7 34.3  RDW 13.2 13.3 13.2  PLT 155 141* 143*   Thyroid  Recent Labs  Lab 08/31/21 0052  TSH 4.625*    BNPNo results for input(s): BNP, PROBNP in the last 168 hours.  DDimer No results for input(s): DDIMER in the last 168 hours.   Radiology    No results found.  Cardiac Studies   Echo 08/31/2021 1. Left ventricular ejection fraction, by estimation, is 70 to 75%. The  left ventricle has hyperdynamic function. The left ventricle has  no  regional wall motion abnormalities. There is moderate concentric left  ventricular hypertrophy. Left ventricular  diastolic parameters are consistent with Grade I diastolic dysfunction  (impaired relaxation).   2. Right ventricular systolic function is normal. The right ventricular  size is normal. Tricuspid regurgitation signal is inadequate for assessing  PA pressure.   3. The mitral valve is normal in structure. Trivial mitral valve  regurgitation.   4. The aortic valve is tricuspid. There is mild calcification of the  aortic valve. There is mild thickening of the aortic valve. Aortic valve  regurgitation is trivial.   5. Aortic dilatation noted. 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   6. The inferior vena cava is normal in size with greater than 50%  respiratory variability, suggesting right atrial pressure of 3 mmHg   LEFT HEART CATH AND CORONARY ANGIOGRAPHY 09/01/21    Conclusion   Conclusions: Severe three-vessel coronary artery disease, as detailed below. Mildly elevated left ventricular filling pressure.   Recommendations: Cardiac surgery consultation for CABG. Restart IV heparin 2 hours after TR band removal. Start isosorbide mononitrate and wean off nitroglycerin infusion, as tolerated. Aggressive secondary prevention of coronary artery disease.  Diagnostic Dominance: Right       Patient Profile     75 y.o. female  hx of  HTN, autoimmune hepatitis and tobacco use who presented to [facility] with chest pain found to have elevated troponin now transferred to [facility] for NSTEMI.    Assessment & Plan    Non-STEMI: High-sensitivity troponin peaked at 1359.  Underwent cardiac catheterization noted above with severe three-vessel disease.  She was evaluated by [facility], [facility] with plans for CABG on Monday.  No recurrent chest pain.  Echo with hyperdynamic LV function. --Continue aspirin, metoprolol 50 mg daily   Hypertension: Blood  pressures stable --Now on  amlodipine to 10 mg daily, continue metoprolol XL 50 mg, imdur 30 mg   Autoimmune hepatitis: Held azathioprine 75 mg daily (see surgical consult note)   Hyperlipidemia: LDL 145, HDL 49 --She is not a statin candidate given her history of liver fibrosis with AI hepatitis -- Zetia 10 mg added -- Good candidate for PCSK as outpatient   Dental disease: poor dentition on radiograph, loose tooth  Mild carotid disease  Tobacco use: cessation advised Anxiety: Getting PRN aprazalam    For questions or updates, please contact [place] Please consult [email or web address] for contact info under        Signed, Reino Bellis, NP  09/05/2021, 11:39 AM     Patient seen and examined. Agree with assessment and plan.  No recurrent chest pain.  No significant shortness of breath.  Continues to be on heparin.  Seen by Dr. Cyndia Bent today.  On schedule for CABG Monday tooth will later need to be extracted.  Consider PCSK9 inhibition post CABG with inability to take statins due to liver fibrosis with autoimmune hepatitis.   Troy Sine, MD, [facility] 09/05/2021 5:28 PM

## 2021-09-05 NOTE — Progress Notes (Signed)
Mobility Specialist: Progress Note   09/05/21 1622  Mobility  Activity Ambulated in hall  Level of Assistance Independent after set-up  Assistive Device  (IV Pole)  Distance Ambulated (ft) 470 ft  Mobility Ambulated with assistance in hallway  Mobility Response Tolerated well  Mobility performed by Mobility specialist  $Mobility charge 1 Mobility   Pt independent with mobility, no c/o throughout. Pt back to bed after walk with family present in the room.   [facility] Keyly Baldonado Mobility Specialist Mobility Specialist Phone #1: [phone] Mobility Specialist Phone #2: [phone]

## 2021-09-05 NOTE — H&P (View-Only) (Signed)
4 Days Post-Op Procedure(s) (LRB): LEFT HEART CATH AND CORONARY ANGIOGRAPHY (N/A) Subjective: No chest pain or shortness of breath. Has been walking the halls and working on IS ( has only gotten to 1500 cc a couple times)  Objective: Vital signs in last 24 hours: Temp:  [97.5 F (36.4 C)-98.1 F (36.7 C)] 97.5 F (36.4 C) (12/09 1550) Pulse Rate:  [58-72] 66 (12/09 1140) Cardiac Rhythm: Normal sinus rhythm (12/09 0700) Resp:  [15-20] 15 (12/09 1550) BP: (103-154)/(57-86) 115/57 (12/09 1550) SpO2:  [95 %-100 %] 95 % (12/09 1550)  Hemodynamic parameters for last 24 hours:    Intake/Output from previous day: 12/08 0701 - 12/09 0700 In: 830.2 [P.O.:590; I.V.:240.2] Out: 800 [Urine:800] Intake/Output this shift: Total I/O In: 436.9 [P.O.:360; I.V.:76.9] Out: -   General appearance: alert and cooperative Heart: regular rate and rhythm Lungs: clear to auscultation bilaterally Extremities: edema none  Lab Results: Recent Labs    09/03/21 0048 09/04/21 0154  WBC 6.1 5.5  HGB 12.5 12.1  HCT 37.1 35.3*  PLT 141* 143*   BMET:  Recent Labs    09/03/21 0048 09/04/21 0154  NA 134* 136  K 4.0 4.6  CL 106 108  CO2 19* 23  GLUCOSE 86 96  BUN 33* 27*  CREATININE 0.92 0.87  CALCIUM 8.5* 8.9    PT/INR: No results for input(s): LABPROT, INR in the last 72 hours. ABG No results found for: PHART, HCO3, TCO2, ACIDBASEDEF, O2SAT CBG (last 3)  No results for input(s): GLUCAP in the last 72 hours.  Assessment/Plan:  Severe multivessel coronary artery disease. Plan CABG Monday. She has no further questions. Dr. Chales Salmon was not able to see her this week so she will have to have her loose tooth extracted later.    LOS: 6 days    [name] [name] 09/05/2021

## 2021-09-05 NOTE — Progress Notes (Signed)
ANTICOAGULATION CONSULT NOTE   Pharmacy Consult for Heparin Indication: chest pain/ACS  Allergies  Allergen Reactions   Budesonide Rash    Patient Measurements: Height: 5\' 3"  (160 cm) Weight: 56.7 kg (125 lb) IBW/kg (Calculated) : 52.4 Heparin Dosing Weight: 56.7 kg  Vital Signs: Temp: 97.5 F (36.4 C) (12/09 1550) Temp Source: Oral (12/09 1550) BP: 115/57 (12/09 1550) Pulse Rate: 66 (12/09 1140)  Labs: Recent Labs    09/03/21 0048 09/04/21 0154 09/05/21 0138  HGB 12.5 12.1  --   HCT 37.1 35.3*  --   PLT 141* 143*  --   HEPARINUNFRC 0.38 0.37 0.44  CREATININE 0.92 0.87  --      Estimated Creatinine Clearance: 46.9 mL/min (by C-G formula based on SCr of 0.87 mg/dL).   Medical History: Past Medical History:  Diagnosis Date   Anxiety    Hepatitis     Medications:  Medications Prior to Admission  Medication Sig Dispense Refill Last Dose   azaTHIOprine (IMURAN) 50 MG tablet Take 75 mg by mouth daily.   08/30/2021   metoprolol succinate (TOPROL-XL) 50 MG 24 hr tablet Take 50 mg by mouth daily. Take with or immediately following a meal.   08/30/2021 at 0800   amoxicillin (AMOXIL) 500 MG capsule Take 500 mg by mouth 3 (three) times daily. (Patient not taking: Reported on 09/01/2021)   Completed Course    Scheduled:   amLODipine  10 mg Oral Daily   aspirin EC  81 mg Oral Daily   [START ON 09/08/2021] epinephrine  0-10 mcg/min Intravenous To OR   [START ON 09/08/2021] heparin-papaverine-plasmalyte irrigation   Irrigation To OR   [START ON 09/08/2021] insulin   Intravenous To OR   isosorbide mononitrate  30 mg Oral Daily   [START ON 09/08/2021] magnesium sulfate  40 mEq Other To OR   metoprolol succinate  50 mg Oral Daily   nicotine  14 mg Transdermal Daily   [START ON 09/08/2021] phenylephrine  30-200 mcg/min Intravenous To OR   [START ON 09/08/2021] potassium chloride  80 mEq Other To OR   sodium chloride flush  3 mL Intravenous Q12H   [START ON 09/08/2021]  tranexamic acid  15 mg/kg Intravenous To OR   [START ON 09/08/2021] tranexamic acid  2 mg/kg Intracatheter To OR   Infusions:   sodium chloride     [START ON 09/08/2021]  ceFAZolin (ANCEF) IV     [START ON 09/08/2021]  ceFAZolin (ANCEF) IV     [START ON 09/08/2021] dexmedetomidine     [START ON 09/08/2021] heparin 30,000 units/NS 1000 mL solution for CELLSAVER     heparin 850 Units/hr (09/05/21 1536)   [START ON 09/08/2021] milrinone     [START ON 09/08/2021] nitroGLYCERIN     [START ON 09/08/2021] norepinephrine     [START ON 09/08/2021] tranexamic acid (CYKLOKAPRON) infusion ([facility])     [START ON 09/08/2021] vancomycin     PRN: sodium chloride, acetaminophen, ALPRAZolam, nitroGLYCERIN, ondansetron (ZOFRAN) IV, sodium chloride flush  Assessment: 75 yof presenting with chest pain. Heparin per pharmacy consult placed for chest pain/ACS. Patient is on not on anticoagulation prior to arrival. She is now s/p cath with plans for CABG on 12/12 -heparin level 0.44 at goal on 850 units/hr  Goal of Therapy:  Heparin level 0.3-0.7 units/ml Monitor platelets by anticoagulation protocol: Yes   Plan:  Continue heparin 850 units/hr  Daily heparin level and CBC   14/12 Pharm.D. CPP, BCPS Clinical Pharmacist [phone] 09/05/2021 4:07 PM   **  Pharmacist phone directory can now be found on [email or web address] ([email or web address]).  Listed under [facility].

## 2021-09-05 NOTE — Care Management Important Message (Signed)
Important Message  Patient Details  Name: [name] [name] MRN: [id number] Date of Birth: [birth year]   Medicare Important Message Given:  Yes     Renie Ora 09/05/2021, 9:48 AM

## 2021-09-05 NOTE — Progress Notes (Signed)
4 Days Post-Op Procedure(s) (LRB): LEFT HEART CATH AND CORONARY ANGIOGRAPHY (N/A) Subjective: No chest pain or shortness of breath. Has been walking the halls and working on IS ( has only gotten to 1500 cc a couple times)  Objective: Vital signs in last 24 hours: Temp:  [97.5 F (36.4 C)-98.1 F (36.7 C)] 97.5 F (36.4 C) (12/09 1550) Pulse Rate:  [58-72] 66 (12/09 1140) Cardiac Rhythm: Normal sinus rhythm (12/09 0700) Resp:  [15-20] 15 (12/09 1550) BP: (103-154)/(57-86) 115/57 (12/09 1550) SpO2:  [95 %-100 %] 95 % (12/09 1550)  Hemodynamic parameters for last 24 hours:    Intake/Output from previous day: 12/08 0701 - 12/09 0700 In: 830.2 [P.O.:590; I.V.:240.2] Out: 800 [Urine:800] Intake/Output this shift: Total I/O In: 436.9 [P.O.:360; I.V.:76.9] Out: -   General appearance: alert and cooperative Heart: regular rate and rhythm Lungs: clear to auscultation bilaterally Extremities: edema none  Lab Results: Recent Labs    09/03/21 0048 09/04/21 0154  WBC 6.1 5.5  HGB 12.5 12.1  HCT 37.1 35.3*  PLT 141* 143*   BMET:  Recent Labs    09/03/21 0048 09/04/21 0154  NA 134* 136  K 4.0 4.6  CL 106 108  CO2 19* 23  GLUCOSE 86 96  BUN 33* 27*  CREATININE 0.92 0.87  CALCIUM 8.5* 8.9    PT/INR: No results for input(s): LABPROT, INR in the last 72 hours. ABG No results found for: PHART, HCO3, TCO2, ACIDBASEDEF, O2SAT CBG (last 3)  No results for input(s): GLUCAP in the last 72 hours.  Assessment/Plan:  Severe multivessel coronary artery disease. Plan CABG Monday. She has no further questions. Dr. Chales Salmon was not able to see her this week so she will have to have her loose tooth extracted later.    LOS: 6 days    [name] [name] 09/05/2021

## 2021-09-06 LAB — HEPARIN LEVEL (UNFRACTIONATED): Heparin Unfractionated: 0.35 IU/mL (ref 0.30–0.70)

## 2021-09-06 MED ORDER — EZETIMIBE 10 MG PO TABS
10.0000 mg | ORAL_TABLET | Freq: Every day | ORAL | Status: DC
  Administered 2021-09-07 – 2021-09-13 (×6): 10 mg via ORAL
  Filled 2021-09-06 (×6): qty 1

## 2021-09-06 NOTE — Progress Notes (Signed)
  Mobility Specialist Criteria Algorithm Info.   09/06/21 1030  Mobility  Activity Ambulated in hall (in chair before and after ambulation)  Range of Motion/Exercises Active;All extremities  Level of Assistance Standby assist, set-up cues, supervision of patient - no hands on  Assistive Device Other (Comment) (IV pole)  Distance Ambulated (ft) 500 ft  Mobility Ambulated with assistance in hallway  Mobility Response Tolerated well  Mobility performed by Mobility specialist  Bed Position Chair   Patient received in recliner chair eager to participate in mobility. Ambulated in hallway with steady gait. Returned to room without complaint or incident. Was left in chair with all needs met, call bell in reach and family present.  09/06/2021 1:04 PM

## 2021-09-06 NOTE — Progress Notes (Signed)
ANTICOAGULATION CONSULT NOTE   Pharmacy Consult for Heparin Indication: chest pain/ACS  Allergies  Allergen Reactions   Budesonide Rash    Patient Measurements: Height: 5\' 3"  (160 cm) Weight: 56.7 kg (125 lb) IBW/kg (Calculated) : 52.4 Heparin Dosing Weight: 56.7 kg  Vital Signs: Temp: 97.7 F (36.5 C) (12/10 0529) Temp Source: Oral (12/10 0529) BP: 145/76 (12/10 0529) Pulse Rate: 64 (12/10 0529)  Labs: Recent Labs    09/04/21 0154 09/05/21 0138 09/06/21 0129  HGB 12.1  --   --   HCT 35.3*  --   --   PLT 143*  --   --   HEPARINUNFRC 0.37 0.44 0.35  CREATININE 0.87  --   --      Estimated Creatinine Clearance: 46.9 mL/min (by C-G formula based on SCr of 0.87 mg/dL).   Medical History: Past Medical History:  Diagnosis Date   Anxiety    Hepatitis     Medications:  Medications Prior to Admission  Medication Sig Dispense Refill Last Dose   azaTHIOprine (IMURAN) 50 MG tablet Take 75 mg by mouth daily.   08/30/2021   metoprolol succinate (TOPROL-XL) 50 MG 24 hr tablet Take 50 mg by mouth daily. Take with or immediately following a meal.   08/30/2021 at 0800   amoxicillin (AMOXIL) 500 MG capsule Take 500 mg by mouth 3 (three) times daily. (Patient not taking: Reported on 09/01/2021)   Completed Course    Scheduled:   amLODipine  10 mg Oral Daily   aspirin EC  81 mg Oral Daily   [START ON 09/08/2021] epinephrine  0-10 mcg/min Intravenous To OR   [START ON 09/08/2021] heparin-papaverine-plasmalyte irrigation   Irrigation To OR   [START ON 09/08/2021] insulin   Intravenous To OR   isosorbide mononitrate  30 mg Oral Daily   [START ON 09/08/2021] magnesium sulfate  40 mEq Other To OR   metoprolol succinate  50 mg Oral Daily   nicotine  14 mg Transdermal Daily   [START ON 09/08/2021] phenylephrine  30-200 mcg/min Intravenous To OR   [START ON 09/08/2021] potassium chloride  80 mEq Other To OR   sodium chloride flush  3 mL Intravenous Q12H   [START ON 09/08/2021]  tranexamic acid  15 mg/kg Intravenous To OR   [START ON 09/08/2021] tranexamic acid  2 mg/kg Intracatheter To OR   Infusions:   sodium chloride     [START ON 09/08/2021]  ceFAZolin (ANCEF) IV     [START ON 09/08/2021]  ceFAZolin (ANCEF) IV     [START ON 09/08/2021] dexmedetomidine     [START ON 09/08/2021] heparin 30,000 units/NS 1000 mL solution for CELLSAVER     heparin 850 Units/hr (09/06/21 0656)   [START ON 09/08/2021] milrinone     [START ON 09/08/2021] nitroGLYCERIN     [START ON 09/08/2021] norepinephrine     [START ON 09/08/2021] tranexamic acid (CYKLOKAPRON) infusion ([facility])     [START ON 09/08/2021] vancomycin     PRN: sodium chloride, acetaminophen, ALPRAZolam, nitroGLYCERIN, ondansetron (ZOFRAN) IV, sodium chloride flush  Assessment: 75 yo female presenting with chest pain. Heparin per pharmacy consult placed for chest pain/ACS. Patient is not on anticoagulation prior to arrival. She is now s/p cath with plans for CABG on 12/12 -heparin level 0.35 within goal on 850 units/hr  Goal of Therapy:  Heparin level 0.3-0.7 units/ml Monitor platelets by anticoagulation protocol: Yes   Plan:  Continue heparin 850 units/hr  Daily heparin level and CBC  Thank you for including pharmacy  in the care of this patient.  Lissa Merlin, PharmD PGY1 Acute Care Pharmacy Resident  Phone: [phone] 09/06/2021  7:28 AM  Please check [email or web address] for unit-specific pharmacy phone numbers.

## 2021-09-06 NOTE — Progress Notes (Signed)
Cardiology Progress Note  Patient ID: [name] [name] MRN: [id number] DOB: [birth year] Date of Encounter: 09/06/2021  Primary Cardiologist: Freada Bergeron, MD  Subjective   Chief Complaint: none.   HPI: Denies chest pain.  Resting comfortably.  ROS:  All other ROS reviewed and negative. Pertinent positives noted in the HPI.     Inpatient Medications  Scheduled Meds:  amLODipine  10 mg Oral Daily   aspirin EC  81 mg Oral Daily   [START ON 09/08/2021] epinephrine  0-10 mcg/min Intravenous To OR   [START ON 09/08/2021] heparin-papaverine-plasmalyte irrigation   Irrigation To OR   [START ON 09/08/2021] insulin   Intravenous To OR   isosorbide mononitrate  30 mg Oral Daily   [START ON 09/08/2021] magnesium sulfate  40 mEq Other To OR   metoprolol succinate  50 mg Oral Daily   nicotine  14 mg Transdermal Daily   [START ON 09/08/2021] phenylephrine  30-200 mcg/min Intravenous To OR   [START ON 09/08/2021] potassium chloride  80 mEq Other To OR   sodium chloride flush  3 mL Intravenous Q12H   [START ON 09/08/2021] tranexamic acid  15 mg/kg Intravenous To OR   [START ON 09/08/2021] tranexamic acid  2 mg/kg Intracatheter To OR   Continuous Infusions:  sodium chloride     [START ON 09/08/2021]  ceFAZolin (ANCEF) IV     [START ON 09/08/2021]  ceFAZolin (ANCEF) IV     [START ON 09/08/2021] dexmedetomidine     [START ON 09/08/2021] heparin 30,000 units/NS 1000 mL solution for CELLSAVER     heparin 850 Units/hr (09/06/21 0656)   [START ON 09/08/2021] milrinone     [START ON 09/08/2021] nitroGLYCERIN     [START ON 09/08/2021] norepinephrine     [START ON 09/08/2021] tranexamic acid (CYKLOKAPRON) infusion ([facility])     [START ON 09/08/2021] vancomycin     PRN Meds: sodium chloride, acetaminophen, ALPRAZolam, nitroGLYCERIN, ondansetron (ZOFRAN) IV, sodium chloride flush   Vital Signs   Vitals:   09/05/21 2200 09/05/21 2349 09/06/21 0529 09/06/21 0748  BP:  (!) 143/75 (!) 145/76  (!) 158/77  Pulse:  63 64 64  Resp: 20 13 19 18   Temp:  97.6 F (36.4 C) 97.7 F (36.5 C) 97.7 F (36.5 C)  TempSrc:  Oral Oral Oral  SpO2:  94% 93% 93%  Weight:      Height:        Intake/Output Summary (Last 24 hours) at 09/06/2021 1005 Last data filed at 09/06/2021 0656 Gross per 24 hour  Intake 538.93 ml  Output 900 ml  Net -361.07 ml   Last 3 Weights 08/30/2021 06/02/2018  Weight (lbs) 125 lb 125 lb  Weight (kg) 56.7 kg 56.7 kg      Telemetry  Overnight telemetry shows sinus rhythm at 70, which I personally reviewed.    Physical Exam   Vitals:   09/05/21 2200 09/05/21 2349 09/06/21 0529 09/06/21 0748  BP:  (!) 143/75 (!) 145/76 (!) 158/77  Pulse:  63 64 64  Resp: 20 13 19 18   Temp:  97.6 F (36.4 C) 97.7 F (36.5 C) 97.7 F (36.5 C)  TempSrc:  Oral Oral Oral  SpO2:  94% 93% 93%  Weight:      Height:        Intake/Output Summary (Last 24 hours) at 09/06/2021 1005 Last data filed at 09/06/2021 0656 Gross per 24 hour  Intake 538.93 ml  Output 900 ml  Net -361.07 ml    Last  3 Weights 08/30/2021 06/02/2018  Weight (lbs) 125 lb 125 lb  Weight (kg) 56.7 kg 56.7 kg    Body mass index is 22.14 kg/m.  General: Well nourished, well developed, in no acute distress Head: Atraumatic, normal size  Eyes: PEERLA, EOMI  Neck: Supple, no JVD Endocrine: No thryomegaly Cardiac: Normal S1, S2; RRR; no murmurs, rubs, or gallops Lungs: Clear to auscultation bilaterally, no wheezing, rhonchi or rales  Abd: Soft, nontender, no hepatomegaly  Ext: No edema, pulses 2+ Musculoskeletal: No deformities, BUE and BLE strength normal and equal Skin: Warm and dry, no rashes   Neuro: Alert and oriented to person, place, time, and situation, CNII-XII grossly intact, no focal deficits  Psych: Normal mood and affect   Labs  High Sensitivity Troponin:   Recent Labs  Lab 08/30/21 1202 08/30/21 1410 08/31/21 1004  TROPONINIHS 1,217* 1,359* 1,120*     Cardiac EnzymesNo results for  input(s): TROPONINI in the last 168 hours. No results for input(s): TROPIPOC in the last 168 hours.  Chemistry Recent Labs  Lab 09/02/21 0331 09/03/21 0048 09/04/21 0154  NA 135 134* 136  K 3.9 4.0 4.6  CL 107 106 108  CO2 19* 19* 23  GLUCOSE 88 86 96  BUN 19 33* 27*  CREATININE 0.76 0.92 0.87  CALCIUM 8.5* 8.5* 8.9  PROT 6.1*  --   --   ALBUMIN 3.3*  --   --   AST 25  --   --   ALT 15  --   --   ALKPHOS 37*  --   --   BILITOT 0.8  --   --   GFRNONAA >60 >60 >60  ANIONGAP 9 9 5     Hematology Recent Labs  Lab 09/02/21 0331 09/03/21 0048 09/04/21 0154  WBC 6.5 6.1 5.5  RBC 4.16 3.90 3.70*  HGB 13.6 12.5 12.1  HCT 39.8 37.1 35.3*  MCV 95.7 95.1 95.4  MCH 32.7 32.1 32.7  MCHC 34.2 33.7 34.3  RDW 13.2 13.3 13.2  PLT 155 141* 143*   BNPNo results for input(s): BNP, PROBNP in the last 168 hours.  DDimer No results for input(s): DDIMER in the last 168 hours.   Radiology  No results found.  Cardiac Studies  Carotid 14/08/22 09/02/2021 Summary:  Right Carotid: Velocities in the right ICA are consistent with a 40-59%                 stenosis.   Left Carotid: Velocities in the left ICA are consistent with a 1-39%  stenosis.  Vertebrals:  Bilateral vertebral arteries demonstrate antegrade flow.  Subclavians: Normal flow hemodynamics were seen in bilateral subclavian               arteries.   TTE 08/31/2021  1. Left ventricular ejection fraction, by estimation, is 70 to 75%. The  left ventricle has hyperdynamic function. The left ventricle has no  regional wall motion abnormalities. There is moderate concentric left  ventricular hypertrophy. Left ventricular  diastolic parameters are consistent with Grade I diastolic dysfunction  (impaired relaxation).   2. Right ventricular systolic function is normal. The right ventricular  size is normal. Tricuspid regurgitation signal is inadequate for assessing  PA pressure.   3. The mitral valve is normal in structure. Trivial  mitral valve  regurgitation.   4. The aortic valve is tricuspid. There is mild calcification of the  aortic valve. There is mild thickening of the aortic valve. Aortic valve  regurgitation is trivial.  5. Aortic dilatation noted. 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   6. The inferior vena cava is normal in size with greater than 50%  respiratory variability, suggesting right atrial pressure of 3 mmHg.  LHC 09/01/2021 Conclusions: Severe three-vessel coronary artery disease, as detailed below. Mildly elevated left ventricular filling pressure.   Recommendations: Cardiac surgery consultation for CABG. Restart IV heparin 2 hours after TR band removal. Start isosorbide mononitrate and wean off nitroglycerin infusion, as tolerated. Aggressive secondary prevention of coronary artery disease.   Patient Profile  [name] [name] is a 75 y.o. female with autoimmune hepatitis, hypertension, former tobacco abuse who was admitted on 08/30/2021 with non-STEMI.  Assessment & Plan   #Non-STEMI #Three-vessel CAD -Left heart cath with three-vessel CAD.  Plans for CABG on Monday. -Echo with normal LV function. -No recurrent chest pain. -We will continue metoprolol succinate 50 mg daily. -She is on Norvasc 10 mg daily as well.  30 mg Imdur daily. -Continue aspirin 81 mg daily.  She is on heparin. -Cannot be on statin due to autoimmune hepatitis.  Zetia 10 mg daily has been added. -Should consider PCSK9 inhibitor as an outpatient.  #Carotid artery disease -Right ICA 40-59% -Left ICA 1-39% -No need for revascularization at time of CABG.  #Hypertension -Continue metoprolol succinate 50 mg daily, Imdur 30 mg daily, and amlodipine 10 mg daily.  Titrate up as needed.  #Autoimmune hepatitis -Azathioprine on hold per surgery.  #HLD -No statin due to autoimmune hepatitis. -Zetia 10 mg daily added. -Outpatient PCSK9 inhibitor.  FEN -no IVF -diet: heart  healthy -dvt ppx: heparin drip -code: full   For questions or updates, please contact [facility] Please consult [email or web address] for contact info under   Time Spent with Patient: I have spent a total of 25 minutes with patient reviewing hospital notes, telemetry, EKGs, labs and examining the patient as well as establishing an assessment and plan that was discussed with the patient.  > 50% of time was spent in direct patient care.    Signed, Addison Naegeli. Audie Box, MD, [place]  09/06/2021 10:05 AM

## 2021-09-07 LAB — BASIC METABOLIC PANEL
Anion gap: 9 (ref 5–15)
BUN: 28 mg/dL — ABNORMAL HIGH (ref 8–23)
CO2: 23 mmol/L (ref 22–32)
Calcium: 9.1 mg/dL (ref 8.9–10.3)
Chloride: 104 mmol/L (ref 98–111)
Creatinine, Ser: 0.86 mg/dL (ref 0.44–1.00)
GFR, Estimated: >60 mL/min (ref >60)
Glucose, Bld: 88 mg/dL (ref 70–99)
Potassium: 5.1 mmol/L (ref 3.5–5.1)
Sodium: 136 mmol/L (ref 135–145)

## 2021-09-07 LAB — ABO/RH: ABO/RH(D): A POS

## 2021-09-07 LAB — CBC
HCT: 38.2 % (ref 36.0–46.0)
Hemoglobin: 13 g/dL (ref 12.0–15.0)
MCH: 32.6 pg (ref 26.0–34.0)
MCHC: 34 g/dL (ref 30.0–36.0)
MCV: 95.7 fL (ref 80.0–100.0)
Platelets: 139 10*3/uL — ABNORMAL LOW (ref 150–400)
RBC: 3.99 MIL/uL (ref 3.87–5.11)
RDW: 13.3 % (ref 11.5–15.5)
WBC: 5.5 10*3/uL (ref 4.0–10.5)
nRBC: 0 % (ref 0.0–0.2)

## 2021-09-07 LAB — TYPE AND SCREEN
ABO/RH(D): A POS
Antibody Screen: NEGATIVE

## 2021-09-07 LAB — HEPARIN LEVEL (UNFRACTIONATED): Heparin Unfractionated: 0.41 IU/mL (ref 0.30–0.70)

## 2021-09-07 LAB — SURGICAL PCR SCREEN
MRSA, PCR: NEGATIVE
Staphylococcus aureus: NEGATIVE

## 2021-09-07 MED ORDER — TEMAZEPAM 15 MG PO CAPS: 15.0000 mg | ORAL_CAPSULE | Freq: Once | ORAL | Status: DC | PRN

## 2021-09-07 MED ORDER — BENZONATATE 100 MG PO CAPS: 100.0000 mg | ORAL_CAPSULE | Freq: Two times a day (BID) | ORAL | Status: DC | PRN

## 2021-09-07 MED ORDER — PSEUDOEPHEDRINE HCL 30 MG PO TABS
30.0000 mg | ORAL_TABLET | Freq: Three times a day (TID) | ORAL | Status: DC | PRN
  Filled 2021-09-07: qty 1

## 2021-09-07 MED ORDER — CHLORHEXIDINE GLUCONATE 0.12 % MT SOLN
15.0000 mL | Freq: Once | OROMUCOSAL | Status: AC
  Administered 2021-09-08: 15 mL via OROMUCOSAL
  Filled 2021-09-07: qty 15

## 2021-09-07 MED ORDER — METOPROLOL TARTRATE 12.5 MG HALF TABLET
12.5000 mg | ORAL_TABLET | Freq: Once | ORAL | Status: AC
  Administered 2021-09-08: 12.5 mg via ORAL
  Filled 2021-09-07: qty 1

## 2021-09-07 MED ORDER — CHLORHEXIDINE GLUCONATE CLOTH 2 % EX PADS
6.0000 | MEDICATED_PAD | Freq: Once | CUTANEOUS | Status: AC
  Administered 2021-09-07: 6 via TOPICAL

## 2021-09-07 MED ORDER — BISACODYL 5 MG PO TBEC
5.0000 mg | DELAYED_RELEASE_TABLET | Freq: Once | ORAL | Status: AC
  Administered 2021-09-07: 5 mg via ORAL
  Filled 2021-09-07: qty 1

## 2021-09-07 MED ORDER — CHLORHEXIDINE GLUCONATE CLOTH 2 % EX PADS: 6.0000 | MEDICATED_PAD | Freq: Once | CUTANEOUS | Status: DC

## 2021-09-07 MED ORDER — DIPHENHYDRAMINE HCL 25 MG PO CAPS
25.0000 mg | ORAL_CAPSULE | Freq: Three times a day (TID) | ORAL | Status: DC | PRN
  Administered 2021-09-07 (×2): 25 mg via ORAL
  Filled 2021-09-07 (×2): qty 1

## 2021-09-07 MED ORDER — DIAZEPAM 5 MG PO TABS
5.0000 mg | ORAL_TABLET | Freq: Once | ORAL | Status: AC
  Administered 2021-09-08: 5 mg via ORAL
  Filled 2021-09-07: qty 1

## 2021-09-07 NOTE — Progress Notes (Signed)
ANTICOAGULATION CONSULT NOTE   Pharmacy Consult for Heparin Indication: chest pain/ACS  Allergies  Allergen Reactions   Budesonide Rash    Patient Measurements: Height: 5\' 3"  (160 cm) Weight: 57.5 kg (126 lb 12.2 oz) IBW/kg (Calculated) : 52.4 Heparin Dosing Weight: 56.7 kg  Vital Signs: Temp: 97.3 F (36.3 C) (12/[name] 0416) Temp Source: Oral (12/[name] 0416) BP: 142/71 (12/[name] 0416) Pulse Rate: 64 (12/[name] 0416)  Labs: Recent Labs    09/05/21 0138 09/06/21 0129 12/[name]/22 0116  HGB  --   --  13.0  HCT  --   --  38.2  PLT  --   --  139*  HEPARINUNFRC 0.44 0.35 0.41  CREATININE  --   --  0.86     Estimated Creatinine Clearance: 47.5 mL/min (by C-G formula based on SCr of 0.86 mg/dL).   Medical History: Past Medical History:  Diagnosis Date   Anxiety    Hepatitis     Medications:  Medications Prior to Admission  Medication Sig Dispense Refill Last Dose   azaTHIOprine (IMURAN) 50 MG tablet Take 75 mg by mouth daily.   08/30/2021   metoprolol succinate (TOPROL-XL) 50 MG 24 hr tablet Take 50 mg by mouth daily. Take with or immediately following a meal.   08/30/2021 at 0800   amoxicillin (AMOXIL) 500 MG capsule Take 500 mg by mouth 3 (three) times daily. (Patient not taking: Reported on 09/01/2021)   Completed Course    Scheduled:   amLODipine  10 mg Oral Daily   aspirin EC  81 mg Oral Daily   [START ON 09/08/2021] epinephrine  0-10 mcg/min Intravenous To OR   ezetimibe  10 mg Oral Daily   [START ON 09/08/2021] heparin-papaverine-plasmalyte irrigation   Irrigation To OR   [START ON 09/08/2021] insulin   Intravenous To OR   isosorbide mononitrate  30 mg Oral Daily   [START ON 09/08/2021] magnesium sulfate  40 mEq Other To OR   metoprolol succinate  50 mg Oral Daily   nicotine  14 mg Transdermal Daily   [START ON 09/08/2021] phenylephrine  30-200 mcg/min Intravenous To OR   [START ON 09/08/2021] potassium chloride  80 mEq Other To OR   sodium chloride flush  3 mL  Intravenous Q12H   [START ON 09/08/2021] tranexamic acid  15 mg/kg Intravenous To OR   [START ON 09/08/2021] tranexamic acid  2 mg/kg Intracatheter To OR   Infusions:   sodium chloride     [START ON 09/08/2021]  ceFAZolin (ANCEF) IV     [START ON 09/08/2021]  ceFAZolin (ANCEF) IV     [START ON 09/08/2021] dexmedetomidine     [START ON 09/08/2021] heparin 30,000 units/NS 1000 mL solution for CELLSAVER     heparin 850 Units/hr (12/[name]/22 0600)   [START ON 09/08/2021] milrinone     [START ON 09/08/2021] nitroGLYCERIN     [START ON 09/08/2021] norepinephrine     [START ON 09/08/2021] tranexamic acid (CYKLOKAPRON) infusion ([facility])     [START ON 09/08/2021] vancomycin     PRN: sodium chloride, acetaminophen, ALPRAZolam, nitroGLYCERIN, ondansetron (ZOFRAN) IV, sodium chloride flush  Assessment: 75 yo [name] presenting with chest pain. Heparin per pharmacy consult placed for chest pain/ACS. Patient was not on anticoagulation prior to arrival. She is now s/p cath with plans for CABG on 12/12 -heparin level 0.41 within goal on 850 units/hr  Goal of Therapy:  Heparin level 0.3-0.7 units/ml Monitor platelets by anticoagulation protocol: Yes   Plan:  Continue heparin 850 units/hr  Daily  heparin level and CBC  Thank you for including pharmacy in the care of this patient.  Lissa Merlin, PharmD PGY1 Acute Care Pharmacy Resident  Phone: [phone] 12/[name]/2022  7:30 AM  Please check [email or web address] for unit-specific pharmacy phone numbers.

## 2021-09-07 NOTE — Progress Notes (Signed)
6 Days Post-Op Procedure(s) (LRB): LEFT HEART CATH AND CORONARY ANGIOGRAPHY (N/A) Subjective: Had a runny nose and watery eyes yesterday  Objective: Vital signs in last 24 hours: Temp:  [97.3 F (36.3 C)-98 F (36.7 C)] 97.5 F (36.4 C) (12/11 0812) Pulse Rate:  [64-69] 67 (12/11 4888) Cardiac Rhythm: Normal sinus rhythm;Heart block (12/11 0700) Resp:  [15-20] 17 (12/11 0812) BP: (97-146)/(59-76) 104/75 (12/11 0812) SpO2:  [95 %-100 %] 96 % (12/11 0812) Weight:  [57.5 kg] 57.5 kg (12/11 0416)  Hemodynamic parameters for last 24 hours:    Intake/Output from previous day: 12/10 0701 - 12/11 0700 In: 794.1 [P.O.:600; I.V.:194.1] Out: 500 [Urine:500] Intake/Output this shift: No intake/output data recorded.  General appearance: alert, cooperative, and no distress Neurologic: intact Heart: regular rate and rhythm  Lab Results: Recent Labs    09/07/21 0116  WBC 5.5  HGB 13.0  HCT 38.2  PLT 139*   BMET:  Recent Labs    09/07/21 0116  NA 136  K 5.1  CL 104  CO2 23  GLUCOSE 88  BUN 28*  CREATININE 0.86  CALCIUM 9.1    PT/INR: No results for input(s): LABPROT, INR in the last 72 hours. ABG No results found for: PHART, HCO3, TCO2, ACIDBASEDEF, O2SAT CBG (last 3)  No results for input(s): GLUCAP in the last 72 hours.  Assessment/Plan: S/P Procedure(s) (LRB): LEFT HEART CATH AND CORONARY ANGIOGRAPHY (N/A) For CABG tomorrow   Decongestant ordered Should not be an issue unless develops fevers or lower respiratory symptoms   LOS: 8 days    Loreli Slot 09/07/2021

## 2021-09-07 NOTE — Anesthesia Preprocedure Evaluation (Addendum)
Anesthesia Evaluation  Patient identified by MRN, date of birth, ID band Patient awake    Reviewed: Allergy & Precautions, NPO status , Patient's Chart, lab work & pertinent test results  Airway Mallampati: II  TM Distance: >3 FB Neck ROM: Full    Dental  (+) Missing, Loose,    Pulmonary Current Smoker and Patient abstained from smoking.,    Pulmonary exam normal breath sounds clear to auscultation       Cardiovascular + CAD and + Past MI  Normal cardiovascular exam Rhythm:Regular Rate:Normal     Neuro/Psych Anxiety negative neurological ROS     GI/Hepatic negative GI ROS, (+) Hepatitis -  Endo/Other  negative endocrine ROS  Renal/GU negative Renal ROS     Musculoskeletal negative musculoskeletal ROS (+)   Abdominal   Peds  Hematology negative hematology ROS (+)   Anesthesia Other Findings CAD  Reproductive/Obstetrics                            Anesthesia Physical Anesthesia Plan  ASA: 4  Anesthesia Plan: General   Post-op Pain Management:    Induction: Intravenous  PONV Risk Score and Plan: 2 and Midazolam and Treatment may vary due to age or medical condition  Airway Management Planned: Oral ETT  Additional Equipment: Arterial line, CVP, PA Cath, TEE and Ultrasound Guidance Line Placement  Intra-op Plan:   Post-operative Plan: Post-operative intubation/ventilation  Informed Consent: I have reviewed the patients History and Physical, chart, labs and discussed the procedure including the risks, benefits and alternatives for the proposed anesthesia with the patient or authorized representative who has indicated his/her understanding and acceptance.     Dental advisory given  Plan Discussed with: CRNA  Anesthesia Plan Comments:        Anesthesia Quick Evaluation

## 2021-09-07 NOTE — Progress Notes (Signed)
Cardiology Progress Note  Patient ID: Mikisha Dacruz MRN: [id number] DOB: [birth year] Date of Encounter: 09/07/2021  Primary Cardiologist: Freada Bergeron, MD  Subjective   Chief Complaint: Runny nose  HPI: She reports a runny nose overnight.  Denies chest pain or trouble breathing.  Resting comfortably.  Telemetry unremarkable.  ROS:  All other ROS reviewed and negative. Pertinent positives noted in the HPI.     Inpatient Medications  Scheduled Meds:  amLODipine  10 mg Oral Daily   aspirin EC  81 mg Oral Daily   [START ON 09/08/2021] epinephrine  0-10 mcg/min Intravenous To OR   ezetimibe  10 mg Oral Daily   [START ON 09/08/2021] heparin-papaverine-plasmalyte irrigation   Irrigation To OR   [START ON 09/08/2021] insulin   Intravenous To OR   isosorbide mononitrate  30 mg Oral Daily   [START ON 09/08/2021] magnesium sulfate  40 mEq Other To OR   metoprolol succinate  50 mg Oral Daily   nicotine  14 mg Transdermal Daily   [START ON 09/08/2021] phenylephrine  30-200 mcg/min Intravenous To OR   [START ON 09/08/2021] potassium chloride  80 mEq Other To OR   sodium chloride flush  3 mL Intravenous Q12H   [START ON 09/08/2021] tranexamic acid  15 mg/kg Intravenous To OR   [START ON 09/08/2021] tranexamic acid  2 mg/kg Intracatheter To OR   Continuous Infusions:  sodium chloride     [START ON 09/08/2021]  ceFAZolin (ANCEF) IV     [START ON 09/08/2021]  ceFAZolin (ANCEF) IV     [START ON 09/08/2021] dexmedetomidine     [START ON 09/08/2021] heparin 30,000 units/NS 1000 mL solution for CELLSAVER     heparin 850 Units/hr (09/07/21 0600)   [START ON 09/08/2021] milrinone     [START ON 09/08/2021] nitroGLYCERIN     [START ON 09/08/2021] norepinephrine     [START ON 09/08/2021] tranexamic acid (CYKLOKAPRON) infusion ([facility])     [START ON 09/08/2021] vancomycin     PRN Meds: sodium chloride, acetaminophen, ALPRAZolam, benzonatate, diphenhydrAMINE, nitroGLYCERIN, ondansetron  (ZOFRAN) IV, pseudoephedrine, sodium chloride flush   Vital Signs   Vitals:   09/06/21 1953 09/06/21 2317 09/07/21 0416 09/07/21 0812  BP: (!) 107/59 (!) 146/76 (!) 142/71 104/75  Pulse: 69 64 64 67  Resp: 16 20 15 17   Temp: (!) 97.4 F (36.3 C) 98 F (36.7 C) (!) 97.3 F (36.3 C) (!) 97.5 F (36.4 C)  TempSrc: Oral Oral Oral Oral  SpO2: 96% 96% 97% 96%  Weight:   57.5 kg   Height:        Intake/Output Summary (Last 24 hours) at 09/07/2021 0916 Last data filed at 09/07/2021 0600 Gross per 24 hour  Intake 554.13 ml  Output 500 ml  Net 54.13 ml   Last 3 Weights 09/07/2021 08/30/2021 06/02/2018  Weight (lbs) 126 lb 12.2 oz 125 lb 125 lb  Weight (kg) 57.5 kg 56.7 kg 56.7 kg      Telemetry  Overnight telemetry shows sinus rhythm 60s, which I personally reviewed.   Physical Exam   Vitals:   09/06/21 1953 09/06/21 2317 09/07/21 0416 09/07/21 0812  BP: (!) 107/59 (!) 146/76 (!) 142/71 104/75  Pulse: 69 64 64 67  Resp: 16 20 15 17   Temp: (!) 97.4 F (36.3 C) 98 F (36.7 C) (!) 97.3 F (36.3 C) (!) 97.5 F (36.4 C)  TempSrc: Oral Oral Oral Oral  SpO2: 96% 96% 97% 96%  Weight:   57.5 kg  Height:        Intake/Output Summary (Last 24 hours) at 09/07/2021 0916 Last data filed at 09/07/2021 0600 Gross per 24 hour  Intake 554.13 ml  Output 500 ml  Net 54.13 ml    Last 3 Weights 09/07/2021 08/30/2021 06/02/2018  Weight (lbs) 126 lb 12.2 oz 125 lb 125 lb  Weight (kg) 57.5 kg 56.7 kg 56.7 kg    Body mass index is 22.46 kg/m.  General: Well nourished, well developed, in no acute distress Head: Atraumatic, normal size  Eyes: PEERLA, EOMI  Neck: Supple, no JVD Endocrine: No thryomegaly Cardiac: Normal S1, S2; RRR; no murmurs, rubs, or gallops Lungs: Clear to auscultation bilaterally, no wheezing, rhonchi or rales  Abd: Soft, nontender, no hepatomegaly  Ext: No edema, pulses 2+ Musculoskeletal: No deformities, BUE and BLE strength normal and equal Skin: Warm and dry, no  rashes   Neuro: Alert and oriented to person, place, time, and situation, CNII-XII grossly intact, no focal deficits  Psych: Normal mood and affect   Labs  High Sensitivity Troponin:   Recent Labs  Lab 08/30/21 1202 08/30/21 1410 08/31/21 1004  TROPONINIHS 1,217* 1,359* 1,120*     Cardiac EnzymesNo results for input(s): TROPONINI in the last 168 hours. No results for input(s): TROPIPOC in the last 168 hours.  Chemistry Recent Labs  Lab 09/02/21 0331 09/03/21 0048 09/04/21 0154 09/07/21 0116  NA 135 134* 136 136  K 3.9 4.0 4.6 5.1  CL 107 106 108 104  CO2 19* 19* 23 23  GLUCOSE 88 86 96 88  BUN 19 33* 27* 28*  CREATININE 0.76 0.92 0.87 0.86  CALCIUM 8.5* 8.5* 8.9 9.1  PROT 6.1*  --   --   --   ALBUMIN 3.3*  --   --   --   AST 25  --   --   --   ALT 15  --   --   --   ALKPHOS 37*  --   --   --   BILITOT 0.8  --   --   --   GFRNONAA >60 >60 >60 >60  ANIONGAP 9 9 5 9     Hematology Recent Labs  Lab 09/03/21 0048 09/04/21 0154 09/07/21 0116  WBC 6.1 5.5 5.5  RBC 3.90 3.70* 3.99  HGB 12.5 12.1 13.0  HCT 37.1 35.3* 38.2  MCV 95.1 95.4 95.7  MCH 32.1 32.7 32.6  MCHC 33.7 34.3 34.0  RDW 13.3 13.2 13.3  PLT 141* 143* 139*   BNPNo results for input(s): BNP, PROBNP in the last 168 hours.  DDimer No results for input(s): DDIMER in the last 168 hours.   Radiology  No results found.  Cardiac Studies  Carotid US 09/02/2021 Summary:  Right Carotid: Velocities in the right ICA are consistent with a 40-59%                 stenosis.   Left Carotid: Velocities in the left ICA are consistent with a 1-39%  stenosis.  Vertebrals:  Bilateral vertebral arteries demonstrate antegrade flow.  Subclavians: Normal flow hemodynamics were seen in bilateral subclavian               arteries.    TTE 08/31/2021  1. Left ventricular ejection fraction, by estimation, is 70 to 75%. The  left ventricle has hyperdynamic function. The left ventricle has no  regional wall motion  abnormalities. There is moderate concentric left  ventricular hypertrophy. Left ventricular  diastolic parameters are consistent with  Grade I diastolic dysfunction  (impaired relaxation).   2. Right ventricular systolic function is normal. The right ventricular  size is normal. Tricuspid regurgitation signal is inadequate for assessing  PA pressure.   3. The mitral valve is normal in structure. Trivial mitral valve  regurgitation.   4. The aortic valve is tricuspid. There is mild calcification of the  aortic valve. There is mild thickening of the aortic valve. Aortic valve  regurgitation is trivial.   5. Aortic dilatation noted. 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   6. The inferior vena cava is normal in size with greater than 50%  respiratory variability, suggesting right atrial pressure of 3 mmHg.   [facility] 09/01/2021 Conclusions: Severe three-vessel coronary artery disease, as detailed below. Mildly elevated left ventricular filling pressure.   Recommendations: Cardiac surgery consultation for CABG. Restart IV heparin 2 hours after TR band removal. Start isosorbide mononitrate and wean off nitroglycerin infusion, as tolerated. Aggressive secondary prevention of coronary artery disease.  Patient Profile  Vicie Cech is a 75 y.o. female with autoimmune hepatitis, hypertension, former tobacco abuse who was admitted on 08/30/2021 with non-STEMI.  Assessment & Plan   #Non-STEMI #Three-vessel CAD -Left heart cath with three-vessel CAD.  Admitted with non-STEMI.  Plans for CABG tomorrow.  N.p.o. at midnight. -No recurrent chest pain or trouble breathing.  Seems to be doing well. -Continue aspirin 81 mg daily.  She is on a heparin drip. -No statin due to autoimmune hepatitis.  Zetia 10 mg daily.  PCSK9 inhibitor therapy as an outpatient. -Continue metoprolol succinate 50 mg daily.  #Runny nose #Nasal congestion -Developed yesterday.  I ordered a  decongestion as well as cough suppressant. -Discussed with surgery and they do not believe this will delay her surgery.  She tested negative for COVID on admission.  #Carotid artery disease -Right ICA 40-59%. -Left ICA 1-39% -No need for revascularization.  Follow-up as an outpatient.  #Hypertension -Metoprolol succinate 50 mg daily, Imdur 30 mg daily, amlodipine 10 mg daily. -Stable.  #Autoimmune hepatitis -Holding azathioprine per surgery.  #Hyperlipidemia -No statin due to autoimmune hepatitis.  Zetia 10 mg daily.  Outpatient PCSK9 inhibitor.  FEN -No intravenous fluids -Diet: Heart healthy -DVT PPx: Heparin drip -Code: Full  For questions or updates, please contact [facility] Please consult [email or web address] for contact info under   Time Spent with Patient: I have spent a total of 25 minutes with patient reviewing hospital notes, telemetry, EKGs, labs and examining the patient as well as establishing an assessment and plan that was discussed with the patient.  > 50% of time was spent in direct patient care.    Signed, Lenna Gilford. Flora Lipps, MD, [facility] [place]  [facility]  09/07/2021 9:16 AM

## 2021-09-08 ENCOUNTER — Inpatient Hospital Stay (HOSPITAL_COMMUNITY): Payer: Medicare HMO

## 2021-09-08 ENCOUNTER — Inpatient Hospital Stay (HOSPITAL_COMMUNITY)
Admission: EM | Disposition: A | Discharge status: Home / Self Care | Payer: Self-pay | Source: Home / Self Care | Attending: Cardiology

## 2021-09-08 ENCOUNTER — Inpatient Hospital Stay (HOSPITAL_COMMUNITY): Payer: Medicare HMO | Admitting: Anesthesiology

## 2021-09-08 DIAGNOSIS — Z951 Presence of aortocoronary bypass graft: Secondary | ICD-10-CM

## 2021-09-08 DIAGNOSIS — I2510 Atherosclerotic heart disease of native coronary artery without angina pectoris: Secondary | ICD-10-CM | POA: Diagnosis present

## 2021-09-08 HISTORY — PX: CORONARY ARTERY BYPASS GRAFT: SHX141

## 2021-09-08 HISTORY — PX: TEE WITHOUT CARDIOVERSION: SHX5443

## 2021-09-08 HISTORY — PX: ENDOVEIN HARVEST OF GREATER SAPHENOUS VEIN: SHX5059

## 2021-09-08 LAB — POCT I-STAT, CHEM 8
BUN: 20 mg/dL (ref 8–23)
BUN: 20 mg/dL (ref 8–23)
BUN: 20 mg/dL (ref 8–23)
BUN: 19 mg/dL (ref 8–23)
BUN: 20 mg/dL (ref 8–23)
Calcium, Ion: 1.17 mmol/L (ref 1.15–1.40)
Calcium, Ion: 1.03 mmol/L — ABNORMAL LOW (ref 1.15–1.40)
Calcium, Ion: 1.23 mmol/L (ref 1.15–1.40)
Calcium, Ion: 1.01 mmol/L — ABNORMAL LOW (ref 1.15–1.40)
Calcium, Ion: 1.02 mmol/L — ABNORMAL LOW (ref 1.15–1.40)
Chloride: 105 mmol/L (ref 98–111)
Chloride: 103 mmol/L (ref 98–111)
Chloride: 105 mmol/L (ref 98–111)
Chloride: 101 mmol/L (ref 98–111)
Chloride: 102 mmol/L (ref 98–111)
Creatinine, Ser: 0.7 mg/dL (ref 0.44–1.00)
Creatinine, Ser: 0.6 mg/dL (ref 0.44–1.00)
Creatinine, Ser: 0.7 mg/dL (ref 0.44–1.00)
Creatinine, Ser: 0.5 mg/dL (ref 0.44–1.00)
Creatinine, Ser: 0.6 mg/dL (ref 0.44–1.00)
Glucose, Bld: 116 mg/dL — ABNORMAL HIGH (ref 70–99)
Glucose, Bld: 83 mg/dL (ref 70–99)
Glucose, Bld: 97 mg/dL (ref 70–99)
Glucose, Bld: 138 mg/dL — ABNORMAL HIGH (ref 70–99)
Glucose, Bld: 119 mg/dL — ABNORMAL HIGH (ref 70–99)
HCT: 38 % (ref 36.0–46.0)
HCT: 29 % — ABNORMAL LOW (ref 36.0–46.0)
HCT: 35 % — ABNORMAL LOW (ref 36.0–46.0)
HCT: 29 % — ABNORMAL LOW (ref 36.0–46.0)
HCT: 28 % — ABNORMAL LOW (ref 36.0–46.0)
Hemoglobin: 12.9 g/dL (ref 12.0–15.0)
Hemoglobin: 11.9 g/dL — ABNORMAL LOW (ref 12.0–15.0)
Hemoglobin: 9.9 g/dL — ABNORMAL LOW (ref 12.0–15.0)
Hemoglobin: 9.9 g/dL — ABNORMAL LOW (ref 12.0–15.0)
Hemoglobin: 9.5 g/dL — ABNORMAL LOW (ref 12.0–15.0)
Potassium: 3.7 mmol/L (ref 3.5–5.1)
Potassium: 4.1 mmol/L (ref 3.5–5.1)
Potassium: 5.1 mmol/L (ref 3.5–5.1)
Potassium: 5.4 mmol/L — ABNORMAL HIGH (ref 3.5–5.1)
Potassium: 5.2 mmol/L — ABNORMAL HIGH (ref 3.5–5.1)
Sodium: 140 mmol/L (ref 135–145)
Sodium: 138 mmol/L (ref 135–145)
Sodium: 139 mmol/L (ref 135–145)
Sodium: 133 mmol/L — ABNORMAL LOW (ref 135–145)
Sodium: 136 mmol/L (ref 135–145)
TCO2: 23 mmol/L (ref 22–32)
TCO2: 26 mmol/L (ref 22–32)
TCO2: 29 mmol/L (ref 22–32)
TCO2: 25 mmol/L (ref 22–32)
TCO2: 25 mmol/L (ref 22–32)

## 2021-09-08 LAB — POCT I-STAT 7, (LYTES, BLD GAS, ICA,H+H)
Acid-Base Excess: 2 mmol/L (ref 0.0–2.0)
Acid-Base Excess: 4 mmol/L — ABNORMAL HIGH (ref 0.0–2.0)
Acid-Base Excess: 1 mmol/L (ref 0.0–2.0)
Acid-Base Excess: 4 mmol/L — ABNORMAL HIGH (ref 0.0–2.0)
Acid-Base Excess: 2 mmol/L (ref 0.0–2.0)
Acid-Base Excess: 1 mmol/L (ref 0.0–2.0)
Acid-Base Excess: 0 mmol/L (ref 0.0–2.0)
Acid-base deficit: 4 mmol/L — ABNORMAL HIGH (ref 0.0–2.0)
Acid-base deficit: 1 mmol/L (ref 0.0–2.0)
Bicarbonate: 26.3 mmol/L (ref 20.0–28.0)
Bicarbonate: 28.6 mmol/L — ABNORMAL HIGH (ref 20.0–28.0)
Bicarbonate: 26.2 mmol/L (ref 20.0–28.0)
Bicarbonate: 27.3 mmol/L (ref 20.0–28.0)
Bicarbonate: 25.8 mmol/L (ref 20.0–28.0)
Bicarbonate: 23.2 mmol/L (ref 20.0–28.0)
Bicarbonate: 25.9 mmol/L (ref 20.0–28.0)
Bicarbonate: 25.9 mmol/L (ref 20.0–28.0)
Bicarbonate: 25.4 mmol/L (ref 20.0–28.0)
Calcium, Ion: 1.19 mmol/L (ref 1.15–1.40)
Calcium, Ion: 0.97 mmol/L — ABNORMAL LOW (ref 1.15–1.40)
Calcium, Ion: 1.01 mmol/L — ABNORMAL LOW (ref 1.15–1.40)
Calcium, Ion: 1.05 mmol/L — ABNORMAL LOW (ref 1.15–1.40)
Calcium, Ion: 1.03 mmol/L — ABNORMAL LOW (ref 1.15–1.40)
Calcium, Ion: 1.04 mmol/L — ABNORMAL LOW (ref 1.15–1.40)
Calcium, Ion: 1.03 mmol/L — ABNORMAL LOW (ref 1.15–1.40)
Calcium, Ion: 1.07 mmol/L — ABNORMAL LOW (ref 1.15–1.40)
Calcium, Ion: 1.07 mmol/L — ABNORMAL LOW (ref 1.15–1.40)
HCT: 37 % (ref 36.0–46.0)
HCT: 27 % — ABNORMAL LOW (ref 36.0–46.0)
HCT: 28 % — ABNORMAL LOW (ref 36.0–46.0)
HCT: 28 % — ABNORMAL LOW (ref 36.0–46.0)
HCT: 26 % — ABNORMAL LOW (ref 36.0–46.0)
HCT: 34 % — ABNORMAL LOW (ref 36.0–46.0)
HCT: 31 % — ABNORMAL LOW (ref 36.0–46.0)
HCT: 32 % — ABNORMAL LOW (ref 36.0–46.0)
HCT: 32 % — ABNORMAL LOW (ref 36.0–46.0)
Hemoglobin: 12.6 g/dL (ref 12.0–15.0)
Hemoglobin: 9.2 g/dL — ABNORMAL LOW (ref 12.0–15.0)
Hemoglobin: 9.5 g/dL — ABNORMAL LOW (ref 12.0–15.0)
Hemoglobin: 9.5 g/dL — ABNORMAL LOW (ref 12.0–15.0)
Hemoglobin: 8.8 g/dL — ABNORMAL LOW (ref 12.0–15.0)
Hemoglobin: 11.6 g/dL — ABNORMAL LOW (ref 12.0–15.0)
Hemoglobin: 10.5 g/dL — ABNORMAL LOW (ref 12.0–15.0)
Hemoglobin: 10.9 g/dL — ABNORMAL LOW (ref 12.0–15.0)
Hemoglobin: 10.9 g/dL — ABNORMAL LOW (ref 12.0–15.0)
O2 Saturation: 100 %
O2 Saturation: 100 %
O2 Saturation: 100 %
O2 Saturation: 100 %
O2 Saturation: 100 %
O2 Saturation: 98 %
O2 Saturation: 99 %
O2 Saturation: 99 %
O2 Saturation: 98 %
Patient temperature: 35.8
Patient temperature: 35.5
Patient temperature: 36.7
Patient temperature: 36.9
Potassium: 3.7 mmol/L (ref 3.5–5.1)
Potassium: 4.4 mmol/L (ref 3.5–5.1)
Potassium: 5.2 mmol/L — ABNORMAL HIGH (ref 3.5–5.1)
Potassium: 5.9 mmol/L — ABNORMAL HIGH (ref 3.5–5.1)
Potassium: 5.2 mmol/L — ABNORMAL HIGH (ref 3.5–5.1)
Potassium: 4.4 mmol/L (ref 3.5–5.1)
Potassium: 4.3 mmol/L (ref 3.5–5.1)
Potassium: 4.6 mmol/L (ref 3.5–5.1)
Potassium: 4.3 mmol/L (ref 3.5–5.1)
Sodium: 140 mmol/L (ref 135–145)
Sodium: 141 mmol/L (ref 135–145)
Sodium: 135 mmol/L (ref 135–145)
Sodium: 137 mmol/L (ref 135–145)
Sodium: 137 mmol/L (ref 135–145)
Sodium: 140 mmol/L (ref 135–145)
Sodium: 140 mmol/L (ref 135–145)
Sodium: 140 mmol/L (ref 135–145)
Sodium: 140 mmol/L (ref 135–145)
TCO2: 27 mmol/L (ref 22–32)
TCO2: 30 mmol/L (ref 22–32)
TCO2: 27 mmol/L (ref 22–32)
TCO2: 28 mmol/L (ref 22–32)
TCO2: 27 mmol/L (ref 22–32)
TCO2: 25 mmol/L (ref 22–32)
TCO2: 27 mmol/L (ref 22–32)
TCO2: 27 mmol/L (ref 22–32)
TCO2: 27 mmol/L (ref 22–32)
pCO2 arterial: 40.4 mmHg (ref 32.0–48.0)
pCO2 arterial: 42.1 mmHg (ref 32.0–48.0)
pCO2 arterial: 37.1 mmHg (ref 32.0–48.0)
pCO2 arterial: 41 mmHg (ref 32.0–48.0)
pCO2 arterial: 38.2 mmHg (ref 32.0–48.0)
pCO2 arterial: 50.2 mmHg — ABNORMAL HIGH (ref 32.0–48.0)
pCO2 arterial: 47.8 mmHg (ref 32.0–48.0)
pCO2 arterial: 43.2 mmHg (ref 32.0–48.0)
pCO2 arterial: 42.9 mmHg (ref 32.0–48.0)
pH, Arterial: 7.421 (ref 7.350–7.450)
pH, Arterial: 7.439 (ref 7.350–7.450)
pH, Arterial: 7.413 (ref 7.350–7.450)
pH, Arterial: 7.475 — ABNORMAL HIGH (ref 7.350–7.450)
pH, Arterial: 7.438 (ref 7.350–7.450)
pH, Arterial: 7.267 — ABNORMAL LOW (ref 7.350–7.450)
pH, Arterial: 7.334 — ABNORMAL LOW (ref 7.350–7.450)
pH, Arterial: 7.384 (ref 7.350–7.450)
pH, Arterial: 7.379 (ref 7.350–7.450)
pO2, Arterial: 485 mmHg — ABNORMAL HIGH (ref 83.0–108.0)
pO2, Arterial: 425 mmHg — ABNORMAL HIGH (ref 83.0–108.0)
pO2, Arterial: 387 mmHg — ABNORMAL HIGH (ref 83.0–108.0)
pO2, Arterial: 397 mmHg — ABNORMAL HIGH (ref 83.0–108.0)
pO2, Arterial: 375 mmHg — ABNORMAL HIGH (ref 83.0–108.0)
pO2, Arterial: 113 mmHg — ABNORMAL HIGH (ref 83.0–108.0)
pO2, Arterial: 131 mmHg — ABNORMAL HIGH (ref 83.0–108.0)
pO2, Arterial: 124 mmHg — ABNORMAL HIGH (ref 83.0–108.0)
pO2, Arterial: 101 mmHg (ref 83.0–108.0)

## 2021-09-08 LAB — CBC
HCT: 40.4 % (ref 36.0–46.0)
HCT: 34.5 % — ABNORMAL LOW (ref 36.0–46.0)
HCT: 32.7 % — ABNORMAL LOW (ref 36.0–46.0)
Hemoglobin: 13.8 g/dL (ref 12.0–15.0)
Hemoglobin: 11.3 g/dL — ABNORMAL LOW (ref 12.0–15.0)
Hemoglobin: 10.7 g/dL — ABNORMAL LOW (ref 12.0–15.0)
MCH: 32.6 pg (ref 26.0–34.0)
MCH: 32.1 pg (ref 26.0–34.0)
MCH: 31.8 pg (ref 26.0–34.0)
MCHC: 34.2 g/dL (ref 30.0–36.0)
MCHC: 32.8 g/dL (ref 30.0–36.0)
MCHC: 32.7 g/dL (ref 30.0–36.0)
MCV: 95.5 fL (ref 80.0–100.0)
MCV: 98 fL (ref 80.0–100.0)
MCV: 97.3 fL (ref 80.0–100.0)
Platelets: 154 10*3/uL (ref 150–400)
Platelets: 113 10*3/uL — ABNORMAL LOW (ref 150–400)
Platelets: 152 10*3/uL (ref 150–400)
RBC: 4.23 MIL/uL (ref 3.87–5.11)
RBC: 3.52 MIL/uL — ABNORMAL LOW (ref 3.87–5.11)
RBC: 3.36 MIL/uL — ABNORMAL LOW (ref 3.87–5.11)
RDW: 13.3 % (ref 11.5–15.5)
RDW: 13.2 % (ref 11.5–15.5)
RDW: 13.2 % (ref 11.5–15.5)
WBC: 4.8 10*3/uL (ref 4.0–10.5)
WBC: 11.1 10*3/uL — ABNORMAL HIGH (ref 4.0–10.5)
WBC: 15.1 10*3/uL — ABNORMAL HIGH (ref 4.0–10.5)
nRBC: 0 % (ref 0.0–0.2)
nRBC: 0 % (ref 0.0–0.2)
nRBC: 0 % (ref 0.0–0.2)

## 2021-09-08 LAB — ECHO INTRAOPERATIVE TEE
Height: 63 in
Weight: 2028.23 oz

## 2021-09-08 LAB — POCT I-STAT EG7
Acid-Base Excess: 1 mmol/L (ref 0.0–2.0)
Bicarbonate: 26.1 mmol/L (ref 20.0–28.0)
Calcium, Ion: 1.01 mmol/L — ABNORMAL LOW (ref 1.15–1.40)
HCT: 30 % — ABNORMAL LOW (ref 36.0–46.0)
Hemoglobin: 10.2 g/dL — ABNORMAL LOW (ref 12.0–15.0)
O2 Saturation: 88 %
Potassium: 4.3 mmol/L (ref 3.5–5.1)
Sodium: 141 mmol/L (ref 135–145)
TCO2: 27 mmol/L (ref 22–32)
pCO2, Ven: 41.4 mmHg — ABNORMAL LOW (ref 44.0–60.0)
pH, Ven: 7.408 (ref 7.250–7.430)
pO2, Ven: 54 mmHg — ABNORMAL HIGH (ref 32.0–45.0)

## 2021-09-08 LAB — BASIC METABOLIC PANEL
Anion gap: 8 (ref 5–15)
Anion gap: 6 (ref 5–15)
BUN: 21 mg/dL (ref 8–23)
BUN: 17 mg/dL (ref 8–23)
CO2: 25 mmol/L (ref 22–32)
CO2: 22 mmol/L (ref 22–32)
Calcium: 9.3 mg/dL (ref 8.9–10.3)
Calcium: 7.2 mg/dL — ABNORMAL LOW (ref 8.9–10.3)
Chloride: 104 mmol/L (ref 98–111)
Chloride: 108 mmol/L (ref 98–111)
Creatinine, Ser: 0.87 mg/dL (ref 0.44–1.00)
Creatinine, Ser: 0.77 mg/dL (ref 0.44–1.00)
GFR, Estimated: >60 mL/min (ref >60)
GFR, Estimated: >60 mL/min (ref >60)
Glucose, Bld: 91 mg/dL (ref 70–99)
Glucose, Bld: 173 mg/dL — ABNORMAL HIGH (ref 70–99)
Potassium: 4.2 mmol/L (ref 3.5–5.1)
Potassium: 4.3 mmol/L (ref 3.5–5.1)
Sodium: 137 mmol/L (ref 135–145)
Sodium: 136 mmol/L (ref 135–145)

## 2021-09-08 LAB — GLUCOSE, CAPILLARY
Glucose-Capillary: 143 mg/dL — ABNORMAL HIGH (ref 70–99)
Glucose-Capillary: 168 mg/dL — ABNORMAL HIGH (ref 70–99)
Glucose-Capillary: 144 mg/dL — ABNORMAL HIGH (ref 70–99)
Glucose-Capillary: 92 mg/dL (ref 70–99)
Glucose-Capillary: 174 mg/dL — ABNORMAL HIGH (ref 70–99)
Glucose-Capillary: 169 mg/dL — ABNORMAL HIGH (ref 70–99)
Glucose-Capillary: 168 mg/dL — ABNORMAL HIGH (ref 70–99)
Glucose-Capillary: 147 mg/dL — ABNORMAL HIGH (ref 70–99)
Glucose-Capillary: 163 mg/dL — ABNORMAL HIGH (ref 70–99)
Glucose-Capillary: 141 mg/dL — ABNORMAL HIGH (ref 70–99)

## 2021-09-08 LAB — APTT: aPTT: 34 seconds (ref 24–36)

## 2021-09-08 LAB — HEMOGLOBIN AND HEMATOCRIT, BLOOD
HCT: 28.9 % — ABNORMAL LOW (ref 36.0–46.0)
Hemoglobin: 9.8 g/dL — ABNORMAL LOW (ref 12.0–15.0)

## 2021-09-08 LAB — PROTIME-INR
INR: 1.2 (ref 0.8–1.2)
Prothrombin Time: 15.5 seconds — ABNORMAL HIGH (ref 11.4–15.2)

## 2021-09-08 LAB — HEPARIN LEVEL (UNFRACTIONATED): Heparin Unfractionated: 0.45 IU/mL (ref 0.30–0.70)

## 2021-09-08 LAB — MAGNESIUM: Magnesium: 3.8 mg/dL — ABNORMAL HIGH (ref 1.7–2.4)

## 2021-09-08 LAB — PLATELET COUNT: Platelets: 94 10*3/uL — ABNORMAL LOW (ref 150–400)

## 2021-09-08 SURGERY — CORONARY ARTERY BYPASS GRAFTING (CABG)
Anesthesia: General | Site: Chest | Laterality: Right

## 2021-09-08 MED ORDER — MIDAZOLAM HCL (PF) 10 MG/2ML IJ SOLN
INTRAMUSCULAR | Status: AC
  Filled 2021-09-08: qty 2

## 2021-09-08 MED ORDER — ALBUMIN HUMAN 5 % IV SOLN
250.0000 mL | INTRAVENOUS | Status: AC | PRN
  Administered 2021-09-08 – 2021-09-09 (×3): 12.5 g via INTRAVENOUS

## 2021-09-08 MED ORDER — LACTATED RINGERS IV SOLN: INTRAVENOUS | Status: DC | PRN

## 2021-09-08 MED ORDER — FENTANYL CITRATE (PF) 250 MCG/5ML IJ SOLN
INTRAMUSCULAR | Status: AC
  Filled 2021-09-08: qty 5

## 2021-09-08 MED ORDER — PROTAMINE SULFATE 10 MG/ML IV SOLN
INTRAVENOUS | Status: AC
  Filled 2021-09-08: qty 25

## 2021-09-08 MED ORDER — POTASSIUM CHLORIDE 10 MEQ/50ML IV SOLN: 10.0000 meq | INTRAVENOUS | Status: AC

## 2021-09-08 MED ORDER — ROCURONIUM BROMIDE 10 MG/ML (PF) SYRINGE
PREFILLED_SYRINGE | INTRAVENOUS | Status: AC
  Filled 2021-09-08: qty 10

## 2021-09-08 MED ORDER — DEXAMETHASONE SODIUM PHOSPHATE 10 MG/ML IJ SOLN
INTRAMUSCULAR | Status: AC
  Filled 2021-09-08: qty 1

## 2021-09-08 MED ORDER — INSULIN ASPART 100 UNIT/ML IJ SOLN
3.0000 [IU] | INTRAMUSCULAR | Status: DC
Start: 2021-09-08 — End: 2021-09-10
  Administered 2021-09-08 – 2021-09-09 (×3): 3 [IU] via SUBCUTANEOUS
  Administered 2021-09-09: 6 [IU] via SUBCUTANEOUS

## 2021-09-08 MED ORDER — MAGNESIUM SULFATE 4 GM/100ML IV SOLN
4.0000 g | Freq: Once | INTRAVENOUS | Status: AC
  Administered 2021-09-08: 4 g via INTRAVENOUS
  Filled 2021-09-08: qty 100

## 2021-09-08 MED ORDER — PHENYLEPHRINE HCL-NACL 20-0.9 MG/250ML-% IV SOLN: 0.0000 ug/min | INTRAVENOUS | Status: DC

## 2021-09-08 MED ORDER — DEXMEDETOMIDINE HCL IN NACL 400 MCG/100ML IV SOLN: 0.0000 ug/kg/h | INTRAVENOUS | Status: DC

## 2021-09-08 MED ORDER — FENTANYL CITRATE (PF) 250 MCG/5ML IJ SOLN
INTRAMUSCULAR | Status: DC | PRN
  Administered 2021-09-08: 250 ug via INTRAVENOUS
  Administered 2021-09-08: 50 ug via INTRAVENOUS
  Administered 2021-09-08 (×2): 100 ug via INTRAVENOUS
  Administered 2021-09-08: 300 ug via INTRAVENOUS
  Administered 2021-09-08: 250 ug via INTRAVENOUS

## 2021-09-08 MED ORDER — SODIUM CHLORIDE 0.45 % IV SOLN: INTRAVENOUS | Status: DC | PRN

## 2021-09-08 MED ORDER — PANTOPRAZOLE SODIUM 40 MG PO TBEC
40.0000 mg | DELAYED_RELEASE_TABLET | Freq: Every day | ORAL | Status: DC
  Administered 2021-09-10: 10:00:00 40 mg via ORAL
  Filled 2021-09-08: qty 1

## 2021-09-08 MED ORDER — MIDAZOLAM HCL 2 MG/2ML IJ SOLN: 2.0000 mg | INTRAMUSCULAR | Status: DC | PRN

## 2021-09-08 MED ORDER — LACTATED RINGERS IV SOLN: INTRAVENOUS | Status: DC

## 2021-09-08 MED ORDER — ACETAMINOPHEN 500 MG PO TABS
1000.0000 mg | ORAL_TABLET | Freq: Four times a day (QID) | ORAL | Status: DC
  Administered 2021-09-09 – 2021-09-10 (×7): 1000 mg via ORAL
  Filled 2021-09-08 (×7): qty 2

## 2021-09-08 MED ORDER — THROMBIN (RECOMBINANT) 20000 UNITS EX SOLR
CUTANEOUS | Status: AC
  Filled 2021-09-08: qty 20000

## 2021-09-08 MED ORDER — SODIUM CHLORIDE 0.9 % IV SOLN: 250.0000 mL | INTRAVENOUS | Status: DC

## 2021-09-08 MED ORDER — CHLORHEXIDINE GLUCONATE CLOTH 2 % EX PADS
6.0000 | MEDICATED_PAD | Freq: Every day | CUTANEOUS | Status: DC
  Administered 2021-09-08: 6 via TOPICAL

## 2021-09-08 MED ORDER — PHENYLEPHRINE 40 MCG/ML (10ML) SYRINGE FOR IV PUSH (FOR BLOOD PRESSURE SUPPORT)
PREFILLED_SYRINGE | INTRAVENOUS | Status: DC | PRN
  Administered 2021-09-08: 80 ug via INTRAVENOUS
  Administered 2021-09-08: 20 ug via INTRAVENOUS
  Administered 2021-09-08: 40 ug via INTRAVENOUS
  Administered 2021-09-08 (×2): 20 ug via INTRAVENOUS
  Administered 2021-09-08: 40 ug via INTRAVENOUS

## 2021-09-08 MED ORDER — PROPOFOL 10 MG/ML IV BOLUS
INTRAVENOUS | Status: AC
  Filled 2021-09-08: qty 20

## 2021-09-08 MED ORDER — BISACODYL 10 MG RE SUPP: 10.0000 mg | Freq: Every day | RECTAL | Status: DC

## 2021-09-08 MED ORDER — NITROGLYCERIN IN D5W 200-5 MCG/ML-% IV SOLN: 0.0000 ug/min | INTRAVENOUS | Status: DC

## 2021-09-08 MED ORDER — BISACODYL 5 MG PO TBEC
10.0000 mg | DELAYED_RELEASE_TABLET | Freq: Every day | ORAL | Status: DC
  Administered 2021-09-09 – 2021-09-10 (×2): 10 mg via ORAL
  Filled 2021-09-08 (×2): qty 2

## 2021-09-08 MED ORDER — CHLORHEXIDINE GLUCONATE CLOTH 2 % EX PADS
6.0000 | MEDICATED_PAD | Freq: Every day | CUTANEOUS | Status: DC
  Administered 2021-09-08 – 2021-09-10 (×3): 6 via TOPICAL

## 2021-09-08 MED ORDER — MIDAZOLAM HCL (PF) 5 MG/ML IJ SOLN
INTRAMUSCULAR | Status: DC | PRN
  Administered 2021-09-08 (×2): 2 mg via INTRAVENOUS
  Administered 2021-09-08: 1 mg via INTRAVENOUS
  Administered 2021-09-08: 2 mg via INTRAVENOUS
  Administered 2021-09-08: 3 mg via INTRAVENOUS

## 2021-09-08 MED ORDER — SODIUM CHLORIDE 0.9 % IV SOLN: INTRAVENOUS | Status: DC | PRN

## 2021-09-08 MED ORDER — PROTAMINE SULFATE 10 MG/ML IV SOLN
INTRAVENOUS | Status: DC | PRN
  Administered 2021-09-08: 30 mg via INTRAVENOUS
  Administered 2021-09-08: 180 mg via INTRAVENOUS

## 2021-09-08 MED ORDER — CEFAZOLIN SODIUM-DEXTROSE 2-4 GM/100ML-% IV SOLN
2.0000 g | Freq: Three times a day (TID) | INTRAVENOUS | Status: AC
  Administered 2021-09-08 – 2021-09-10 (×6): 2 g via INTRAVENOUS
  Filled 2021-09-08 (×6): qty 100

## 2021-09-08 MED ORDER — SODIUM CHLORIDE 0.9% IV SOLUTION: Freq: Once | INTRAVENOUS | Status: DC

## 2021-09-08 MED ORDER — ACETAMINOPHEN 160 MG/5ML PO SOLN: 1000.0000 mg | Freq: Four times a day (QID) | ORAL | Status: DC

## 2021-09-08 MED ORDER — HEPARIN SODIUM (PORCINE) 1000 UNIT/ML IJ SOLN
INTRAMUSCULAR | Status: AC
  Filled 2021-09-08: qty 1

## 2021-09-08 MED ORDER — ONDANSETRON HCL 4 MG/2ML IJ SOLN
4.0000 mg | Freq: Four times a day (QID) | INTRAMUSCULAR | Status: DC | PRN
  Administered 2021-09-09: 4 mg via INTRAVENOUS
  Filled 2021-09-08: qty 2

## 2021-09-08 MED ORDER — THROMBIN 20000 UNITS EX SOLR
CUTANEOUS | Status: DC | PRN
  Administered 2021-09-08 (×2): 4 mL via TOPICAL

## 2021-09-08 MED ORDER — ACETAMINOPHEN 650 MG RE SUPP
650.0000 mg | Freq: Once | RECTAL | Status: AC
  Administered 2021-09-08: 650 mg via RECTAL

## 2021-09-08 MED ORDER — PHENYLEPHRINE 40 MCG/ML (10ML) SYRINGE FOR IV PUSH (FOR BLOOD PRESSURE SUPPORT)
PREFILLED_SYRINGE | INTRAVENOUS | Status: AC
  Filled 2021-09-08: qty 20

## 2021-09-08 MED ORDER — 0.9 % SODIUM CHLORIDE (POUR BTL) OPTIME
TOPICAL | Status: DC | PRN
  Administered 2021-09-08: 5000 mL

## 2021-09-08 MED ORDER — SODIUM CHLORIDE 0.9 % IV SOLN: 250.0000 mL | INTRAVENOUS | Status: DC | PRN

## 2021-09-08 MED ORDER — SODIUM CHLORIDE 0.9% FLUSH
3.0000 mL | Freq: Two times a day (BID) | INTRAVENOUS | Status: DC
  Administered 2021-09-09 – 2021-09-10 (×2): 3 mL via INTRAVENOUS

## 2021-09-08 MED ORDER — METOPROLOL TARTRATE 5 MG/5ML IV SOLN: 2.5000 mg | INTRAVENOUS | Status: DC | PRN

## 2021-09-08 MED ORDER — THROMBIN 20000 UNITS EX SOLR
CUTANEOUS | Status: DC | PRN
  Administered 2021-09-08: 20000 [IU] via TOPICAL

## 2021-09-08 MED ORDER — HEMOSTATIC AGENTS (NO CHARGE) OPTIME
TOPICAL | Status: DC | PRN
  Administered 2021-09-08: 1 via TOPICAL

## 2021-09-08 MED ORDER — INSULIN REGULAR(HUMAN) IN NACL 100-0.9 UT/100ML-% IV SOLN: INTRAVENOUS | Status: DC

## 2021-09-08 MED ORDER — ROCURONIUM BROMIDE 10 MG/ML (PF) SYRINGE
PREFILLED_SYRINGE | INTRAVENOUS | Status: AC
  Filled 2021-09-08: qty 20

## 2021-09-08 MED ORDER — SODIUM BICARBONATE 8.4 % IV SOLN
50.0000 meq | Freq: Once | INTRAVENOUS | Status: AC
  Administered 2021-09-08: 50 meq via INTRAVENOUS

## 2021-09-08 MED ORDER — ASPIRIN 81 MG PO CHEW: 324.0000 mg | CHEWABLE_TABLET | Freq: Every day | ORAL | Status: DC

## 2021-09-08 MED ORDER — LACTATED RINGERS IV SOLN: 500.0000 mL | Freq: Once | INTRAVENOUS | Status: DC | PRN

## 2021-09-08 MED ORDER — ROCURONIUM BROMIDE 10 MG/ML (PF) SYRINGE
PREFILLED_SYRINGE | INTRAVENOUS | Status: DC | PRN
  Administered 2021-09-08 (×2): 30 mg via INTRAVENOUS
  Administered 2021-09-08: 40 mg via INTRAVENOUS
  Administered 2021-09-08: 100 mg via INTRAVENOUS

## 2021-09-08 MED ORDER — MIDAZOLAM HCL 2 MG/2ML IJ SOLN
INTRAMUSCULAR | Status: AC
  Filled 2021-09-08: qty 2

## 2021-09-08 MED ORDER — FAMOTIDINE IN NACL 20-0.9 MG/50ML-% IV SOLN
20.0000 mg | Freq: Two times a day (BID) | INTRAVENOUS | Status: AC
  Administered 2021-09-08 (×2): 20 mg via INTRAVENOUS
  Filled 2021-09-08 (×2): qty 50

## 2021-09-08 MED ORDER — METOPROLOL TARTRATE 12.5 MG HALF TABLET: 12.5000 mg | ORAL_TABLET | Freq: Two times a day (BID) | ORAL | Status: DC

## 2021-09-08 MED ORDER — VANCOMYCIN HCL IN DEXTROSE 1-5 GM/200ML-% IV SOLN
1000.0000 mg | Freq: Once | INTRAVENOUS | Status: AC
  Administered 2021-09-08: 1000 mg via INTRAVENOUS
  Filled 2021-09-08: qty 200

## 2021-09-08 MED ORDER — SODIUM CHLORIDE 0.9% FLUSH
10.0000 mL | Freq: Two times a day (BID) | INTRAVENOUS | Status: DC
  Administered 2021-09-08 – 2021-09-09 (×3): 10 mL
  Administered 2021-09-09: 22:00:00 20 mL
  Administered 2021-09-10: 10:00:00 10 mL

## 2021-09-08 MED ORDER — METOPROLOL TARTRATE 25 MG/10 ML ORAL SUSPENSION: 12.5000 mg | Freq: Two times a day (BID) | ORAL | Status: DC

## 2021-09-08 MED ORDER — MORPHINE SULFATE (PF) 2 MG/ML IV SOLN
1.0000 mg | INTRAVENOUS | Status: DC | PRN
  Administered 2021-09-08 (×2): 2 mg via INTRAVENOUS
  Filled 2021-09-08 (×2): qty 1

## 2021-09-08 MED ORDER — PLASMA-LYTE A IV SOLN
INTRAVENOUS | Status: DC | PRN
  Administered 2021-09-08: 1000 mL

## 2021-09-08 MED ORDER — HEPARIN SODIUM (PORCINE) 1000 UNIT/ML IJ SOLN
INTRAMUSCULAR | Status: DC | PRN
  Administered 2021-09-08: 21000 [IU] via INTRAVENOUS

## 2021-09-08 MED ORDER — CHLORHEXIDINE GLUCONATE 0.12 % MT SOLN
15.0000 mL | OROMUCOSAL | Status: AC
  Administered 2021-09-08: 15 mL via OROMUCOSAL

## 2021-09-08 MED ORDER — SODIUM CHLORIDE 0.9 % IV SOLN: INTRAVENOUS | Status: DC

## 2021-09-08 MED ORDER — ASPIRIN EC 325 MG PO TBEC
325.0000 mg | DELAYED_RELEASE_TABLET | Freq: Every day | ORAL | Status: DC
  Administered 2021-09-09 – 2021-09-10 (×2): 325 mg via ORAL
  Filled 2021-09-08 (×2): qty 1

## 2021-09-08 MED ORDER — INSULIN DETEMIR 100 UNIT/ML ~~LOC~~ SOLN
5.0000 [IU] | Freq: Two times a day (BID) | SUBCUTANEOUS | Status: DC
  Administered 2021-09-08 – 2021-09-09 (×3): 5 [IU] via SUBCUTANEOUS
  Filled 2021-09-08 (×5): qty 0.05

## 2021-09-08 MED ORDER — DOCUSATE SODIUM 100 MG PO CAPS
200.0000 mg | ORAL_CAPSULE | Freq: Every day | ORAL | Status: DC
  Administered 2021-09-09 – 2021-09-10 (×2): 200 mg via ORAL
  Filled 2021-09-08 (×2): qty 2

## 2021-09-08 MED ORDER — TRAMADOL HCL 50 MG PO TABS
50.0000 mg | ORAL_TABLET | ORAL | Status: DC | PRN
  Administered 2021-09-09 – 2021-09-10 (×4): 50 mg via ORAL
  Filled 2021-09-08 (×4): qty 1

## 2021-09-08 MED ORDER — DEXTROSE 50 % IV SOLN: 0.0000 mL | INTRAVENOUS | Status: DC | PRN

## 2021-09-08 MED ORDER — SODIUM CHLORIDE 0.9% FLUSH: 3.0000 mL | INTRAVENOUS | Status: DC | PRN

## 2021-09-08 MED ORDER — OXYCODONE HCL 5 MG PO TABS
5.0000 mg | ORAL_TABLET | ORAL | Status: DC | PRN
  Administered 2021-09-09 (×2): 10 mg via ORAL
  Administered 2021-09-10: 01:00:00 5 mg via ORAL
  Filled 2021-09-08: qty 1
  Filled 2021-09-08 (×2): qty 2

## 2021-09-08 MED ORDER — ACETAMINOPHEN 160 MG/5ML PO SOLN: 650.0000 mg | Freq: Once | ORAL | Status: AC

## 2021-09-08 MED ORDER — SODIUM CHLORIDE 0.9% FLUSH: 10.0000 mL | INTRAVENOUS | Status: DC | PRN

## 2021-09-08 SURGICAL SUPPLY — 105 items
BAG DECANTER FOR FLEXI CONT (MISCELLANEOUS) ×5 IMPLANT
BLADE CLIPPER SURG (BLADE) ×5 IMPLANT
BLADE STERNUM SYSTEM 6 (BLADE) ×5 IMPLANT
BNDG ELASTIC 4X5.8 VLCR STR LF (GAUZE/BANDAGES/DRESSINGS) ×5 IMPLANT
BNDG ELASTIC 6X5.8 VLCR STR LF (GAUZE/BANDAGES/DRESSINGS) ×5 IMPLANT
BNDG GAUZE ELAST 4 BULKY (GAUZE/BANDAGES/DRESSINGS) ×5 IMPLANT
CABLE PACING FASLOC BIEGE (MISCELLANEOUS) ×1 IMPLANT
CANISTER SUCT 3000ML PPV (MISCELLANEOUS) ×5 IMPLANT
CATH ROBINSON RED A/P 18FR (CATHETERS) ×10 IMPLANT
CATH THORACIC 28FR (CATHETERS) ×5 IMPLANT
CATH THORACIC 36FR (CATHETERS) ×5 IMPLANT
CATH THORACIC 36FR RT ANG (CATHETERS) ×5 IMPLANT
CLIP TI WIDE RED SMALL 24 (CLIP) ×1 IMPLANT
CLIP VESOCCLUDE MED 24/CT (CLIP) IMPLANT
CLIP VESOCCLUDE SM WIDE 24/CT (CLIP) IMPLANT
CONTAINER PROTECT SURGISLUSH (MISCELLANEOUS) ×10 IMPLANT
DERMABOND ADVANCED (GAUZE/BANDAGES/DRESSINGS) ×1
DERMABOND ADVANCED .7 DNX12 (GAUZE/BANDAGES/DRESSINGS) IMPLANT
DRAPE CARDIOVASCULAR INCISE (DRAPES) ×1
DRAPE SRG 135X102X78XABS (DRAPES) ×4 IMPLANT
DRAPE WARM FLUID 44X44 (DRAPES) ×5 IMPLANT
DRSG COVADERM 4X14 (GAUZE/BANDAGES/DRESSINGS) ×5 IMPLANT
ELECT CAUTERY BLADE 6.4 (BLADE) ×5 IMPLANT
ELECT REM PT RETURN 9FT ADLT (ELECTROSURGICAL) ×10
ELECTRODE REM PT RTRN 9FT ADLT (ELECTROSURGICAL) ×8 IMPLANT
FELT TEFLON 1X6 (MISCELLANEOUS) ×9 IMPLANT
GAUZE 4X4 16PLY ~~LOC~~+RFID DBL (SPONGE) ×1 IMPLANT
GAUZE SPONGE 4X4 12PLY STRL (GAUZE/BANDAGES/DRESSINGS) ×10 IMPLANT
GAUZE SPONGE 4X4 12PLY STRL LF (GAUZE/BANDAGES/DRESSINGS) ×2 IMPLANT
GLOVE SURG ENC MOIS LTX SZ6 (GLOVE) IMPLANT
GLOVE SURG ENC MOIS LTX SZ6.5 (GLOVE) IMPLANT
GLOVE SURG ENC MOIS LTX SZ7 (GLOVE) IMPLANT
GLOVE SURG ENC MOIS LTX SZ7.5 (GLOVE) IMPLANT
GLOVE SURG MICRO LTX SZ7 (GLOVE) ×10 IMPLANT
GLOVE SURG ORTHO LTX SZ7.5 (GLOVE) IMPLANT
GLOVE SURG UNDER POLY LF SZ6 (GLOVE) IMPLANT
GLOVE SURG UNDER POLY LF SZ6.5 (GLOVE) IMPLANT
GLOVE SURG UNDER POLY LF SZ7 (GLOVE) IMPLANT
GOWN STRL REUS W/ TWL LRG LVL3 (GOWN DISPOSABLE) ×16 IMPLANT
GOWN STRL REUS W/ TWL XL LVL3 (GOWN DISPOSABLE) ×4 IMPLANT
GOWN STRL REUS W/TWL LRG LVL3 (GOWN DISPOSABLE) ×4
GOWN STRL REUS W/TWL XL LVL3 (GOWN DISPOSABLE) ×1
HEMOSTAT POWDER SURGIFOAM 1G (HEMOSTASIS) ×15 IMPLANT
HEMOSTAT SURGICEL 2X14 (HEMOSTASIS) ×5 IMPLANT
INSERT FOGARTY 61MM (MISCELLANEOUS) IMPLANT
INSERT FOGARTY XLG (MISCELLANEOUS) IMPLANT
KIT BASIN OR (CUSTOM PROCEDURE TRAY) ×5 IMPLANT
KIT CATH CPB BARTLE (MISCELLANEOUS) ×5 IMPLANT
KIT SUCTION CATH 14FR (SUCTIONS) ×5 IMPLANT
KIT TURNOVER KIT B (KITS) ×5 IMPLANT
KIT VASOVIEW HEMOPRO 2 VH 4000 (KITS) ×5 IMPLANT
NS IRRIG 1000ML POUR BTL (IV SOLUTION) ×25 IMPLANT
PACK E OPEN HEART (SUTURE) ×5 IMPLANT
PACK OPEN HEART (CUSTOM PROCEDURE TRAY) ×5 IMPLANT
PAD ARMBOARD 7.5X6 YLW CONV (MISCELLANEOUS) ×10 IMPLANT
PAD ELECT DEFIB RADIOL ZOLL (MISCELLANEOUS) ×5 IMPLANT
PENCIL BUTTON HOLSTER BLD 10FT (ELECTRODE) ×5 IMPLANT
POSITIONER HEAD DONUT 9IN (MISCELLANEOUS) ×5 IMPLANT
PUNCH AORTIC ROTATE 4.0MM (MISCELLANEOUS) IMPLANT
PUNCH AORTIC ROTATE 4.5MM 8IN (MISCELLANEOUS) ×5 IMPLANT
PUNCH AORTIC ROTATE 5MM 8IN (MISCELLANEOUS) IMPLANT
SENSOR MYOCARDIAL TEMP (MISCELLANEOUS) ×1 IMPLANT
SET MPS 3-ND DEL (MISCELLANEOUS) ×1 IMPLANT
SOL PREP POV-IOD 4OZ 10% (MISCELLANEOUS) ×2 IMPLANT
SOL PREP PROV IODINE SCRUB 4OZ (MISCELLANEOUS) ×2 IMPLANT
SPONGE INTESTINAL PEANUT (DISPOSABLE) IMPLANT
SPONGE T-LAP 18X18 ~~LOC~~+RFID (SPONGE) ×4 IMPLANT
SPONGE T-LAP 4X18 ~~LOC~~+RFID (SPONGE) ×6 IMPLANT
SUPPORT HEART JANKE-BARRON (MISCELLANEOUS) ×5 IMPLANT
SUT BONE WAX W31G (SUTURE) ×5 IMPLANT
SUT MNCRL AB 4-0 PS2 18 (SUTURE) ×1 IMPLANT
SUT PROLENE 3 0 SH DA (SUTURE) IMPLANT
SUT PROLENE 3 0 SH1 36 (SUTURE) ×5 IMPLANT
SUT PROLENE 4 0 RB 1 (SUTURE)
SUT PROLENE 4 0 SH DA (SUTURE) IMPLANT
SUT PROLENE 4-0 RB1 .5 CRCL 36 (SUTURE) IMPLANT
SUT PROLENE 5 0 C 1 36 (SUTURE) IMPLANT
SUT PROLENE 6 0 C 1 30 (SUTURE) ×1 IMPLANT
SUT PROLENE 7 0 BV 1 (SUTURE) IMPLANT
SUT PROLENE 7 0 BV1 MDA (SUTURE) ×6 IMPLANT
SUT PROLENE 8 0 BV175 6 (SUTURE) IMPLANT
SUT SILK  1 MH (SUTURE)
SUT SILK 1 MH (SUTURE) IMPLANT
SUT SILK 2 0 SH (SUTURE) IMPLANT
SUT STEEL 6MS V (SUTURE) ×2 IMPLANT
SUT STEEL STERNAL CCS#1 18IN (SUTURE) IMPLANT
SUT STEEL SZ 6 DBL 3X14 BALL (SUTURE) IMPLANT
SUT VIC AB 1 CTX 36 (SUTURE) ×2
SUT VIC AB 1 CTX36XBRD ANBCTR (SUTURE) ×8 IMPLANT
SUT VIC AB 2-0 CT1 27 (SUTURE) ×1
SUT VIC AB 2-0 CT1 TAPERPNT 27 (SUTURE) IMPLANT
SUT VIC AB 2-0 CTX 27 (SUTURE) IMPLANT
SUT VIC AB 3-0 SH 27 (SUTURE)
SUT VIC AB 3-0 SH 27X BRD (SUTURE) IMPLANT
SUT VIC AB 3-0 X1 27 (SUTURE) IMPLANT
SUT VICRYL 4-0 PS2 18IN ABS (SUTURE) IMPLANT
SYSTEM SAHARA CHEST DRAIN ATS (WOUND CARE) ×5 IMPLANT
TAPE CLOTH SURG 4X10 WHT LF (GAUZE/BANDAGES/DRESSINGS) ×2 IMPLANT
TAPE PAPER 2X10 WHT MICROPORE (GAUZE/BANDAGES/DRESSINGS) ×1 IMPLANT
TOWEL GREEN STERILE (TOWEL DISPOSABLE) ×5 IMPLANT
TOWEL GREEN STERILE FF (TOWEL DISPOSABLE) ×5 IMPLANT
TRAY FOLEY SLVR 16FR TEMP STAT (SET/KITS/TRAYS/PACK) ×5 IMPLANT
TUBING LAP HI FLOW INSUFFLATIO (TUBING) ×5 IMPLANT
UNDERPAD 30X36 HEAVY ABSORB (UNDERPADS AND DIAPERS) ×5 IMPLANT
WATER STERILE IRR 1000ML POUR (IV SOLUTION) ×10 IMPLANT

## 2021-09-08 NOTE — Discharge Summary (Signed)
Physician Discharge Summary       [address]       [place] [address]             [phone]    Patient ID: [name] [name] MRN: [id number] DOB/AGE: 05-May-1946 75 y.o.  Admit date: 08/30/2021 Discharge date: 09/13/2021  Admission Diagnoses: NSTEMI (non-ST elevated myocardial infarction) ([place]) 2. Coronary artery disease  Discharge Diagnoses:  1.  S/P CABG x 4 2. Expected post op blood loss anemia 3. History of tobacco abuse 4. History of Hepatitis 5. History of anxiety  Consults: None  Procedure (s):  Median Sternotomy Extracorporeal circulation 3.   Coronary artery bypass grafting x 4   SVG to the LAD SVG to the RCA Sequential SVG to OM1 and OM2   4.   Endoscopic vein harvest from the right leg by Dr. Cyndia Bent on 09/08/2021.  History of Presenting Illness: [name] [name] is a 75 year old female with past history hypertension, tobacco use, positive family history for cardiac disease, and history of autoimmune hepatitis treated with azathioprine.  She takes frequent walks in the park and recently noticed having exertional chest discomfort during her afternoon walks.  She had an episode of vague chest discomfort on 08/30/2021 which prompted her visit to [facility].  EKG showed sinus rhythm heart rate of 70 and evidence of an inferior infarct although age cannot be determined.  Initial troponin was elevated at greater than 1100.  Chest x-ray was unremarkable.  She was transferred and admitted to [facility] for additional evaluation and management.  She is started on heparin and nitroglycerin drips had no further chest discomfort.  She had an echocardiogram demonstrating trivial mitral insufficiency and trivial aortic regurgitation.  Her ejection fraction was estimated at 70 to 75% with some LV hypertrophy noted.  Ascending aorta measured at 37 mm.  This morning, she had left heart catheterization demonstrating three-vessel coronary artery  disease.  Primary lesions identified included a 50% left main stenosis.  The left anterior descending coronary artery had a 90% mid stenosis.  The circumflex coronary artery had a 70% ostial stenosis, and the RCA had a 90% mid stenosis.  She has remained stable following left heart catheterization.  Nitroglycerin infusion was resumed but she has since been converted to an oral nitrate.  Heparin is scheduled to resume later today.  She remains pain-free. CT surgery has been asked to evaluate [name] [name] for consideration of coronary bypass grafting.    [name] [name] is retired but she continues to be very active.  She was walking 7 miles a day but started having knee trouble and has reduced her daily walks down to 2 miles each morning and 2 miles each afternoon.  She admits that she is this most of the time and has had stress with traditional management with anxiolytics or SSRIs.  She admits that her smoking has increased since she retired and she is reluctant to agree to have heart surgery because she knows she will be asked to quit smoking but she says "it is one of the few things I enjoy".   She has a history of autoimmune hepatitis that was initially managed by gastroenterology here locally.  She has since transferred care down to the department of gastroenterology at [facility] and is cared for by Dr. Damita Lack.  She has been on azathioprine for about 3 years and on recent visits to Dr. Damita Lack, he has discussed tapering or discontinuing that medication  since her liver enzymes have been near normal for a few years now.   In regards to her dentition, her lower teeth are in good repair.  She has 6 remaining teeth in her upper jaw.  The far left tooth has some decay and is loose.  She is currently taking amoxicillin for this and tooth was supposed to be removed.  Dr. Cyndia Bent discussed the need for coronary artery bypass grafting surgery. Potential risks, benefits, and complications of the surgery were  discussed ant the patient agreed to proceed with surgery. Azathioprine was held. Carotid duplex US showed no significant left internal carotid artery stenosis and a 40-59% right internal carotid artery stenosis.   Brief Hospital Course:  Patient underwent a CABG x 4 on 09/08/2021. She was transferred in stable condition for  the OR to [facility] ICU. She was extubated without difficulty early the evening of surgery. She was on no drips, except Insulin. Gordy Councilman, a lin, chest tubes were removed on post op day one. Foley was removed on post op day 2. Her SBP was in the low 90's so she was not started on a BB. Her pre op HGA1C was 5.5. Accu checks and SS PRN were stopped on transfer. She was felt stable for transfer to 4E for further convalescence on 12/14. SBP remained in the low 100's. She was volume overloaded and diuresed. She had expected post op blood loss anemia. Last H and H was slightly decreased to 8.4 and 24.5. She was started on oral Ferrous. Epicardial pacing wires were removed on 12/15. She was ambulating on room air with good oxygenation. With her auto immune hepatitis, she will contact her medical doctor to see whether or not to resume Azathioprine. She is not on a statin because she previously had elevated LFTs related to auto immune hepatitis. She is on Zetia, however. She has been tolerating a diet and has had a bowel movement. Her wounds are clean, dry, healing without signs of infection. Chest tube sutures will be removed in the office after discharge. She is felt surgically stable for discharge today.  Addendum  12/17:  Patient very emotional and depressed.  She was started on Zoloft.  She was reassured this is a normal thing to happen with the type of surgery she has been through.  She was instructed to follow up with her family doctor in the next 1-2 weeks for additional follow up.   Latest Vital Signs: Blood pressure (!) 145/89, pulse 90, temperature (!) 97.5 F (36.4 C), temperature source  Oral, resp. rate 19, height 5\' 3"  (1.6 m), weight 62 kg, SpO2 100 %.  Physical Exam: Cardiovascular: RRR Pulmonary: Slightly diminished left basilar breath sounds Abdomen: Soft, non tender, bowel sounds present. Extremities: Mild bilateral lower extremity edema. Wounds: Sternal wounds and RLE wounds are clean and dry.  No erythema or signs of infection.    Discharge Condition:Stable and discharged to home.  Recent laboratory studies:  Lab Results  Component Value Date   WBC 5.8 09/11/2021   HGB 8.4 (L) 09/11/2021   HCT 24.5 (L) 09/11/2021   MCV 97.2 09/11/2021   PLT 88 (L) 09/11/2021   Lab Results  Component Value Date   NA 134 (L) 09/11/2021   K 4.5 09/11/2021   CL 104 09/11/2021   CO2 26 09/11/2021   CREATININE 0.94 09/11/2021   GLUCOSE 97 09/11/2021      Diagnostic Studies: DG Orthopantogram  Result Date: 09/02/2021 CLINICAL DATA:  Preop for heart surgery. EXAM:  ORTHOPANTOGRAM/PANORAMIC COMPARISON:  None. FINDINGS: Poor dentition is noted particularly involving the maxilla. No fracture or lytic lesion is noted. IMPRESSION: Poor dentition is noted.  No fracture or lytic lesion is noted. Electronically Signed   By: Marijo Conception M.D.   On: 09/02/2021 15:53   DG Chest 2 View  Result Date: 09/11/2021 CLINICAL DATA:  Pleural effusion EXAM: CHEST - 2 VIEW COMPARISON:  Chest x-ray dated September 10, 2021 FINDINGS: Cardiac and mediastinal contours are unchanged status post median sternotomy. Epicardial pacing wires noted. Interval removal of right IJ line. Bibasilar atelectasis. Trace right and small left pleural effusions are slightly increased in size when compared to prior exam. No evidence of pneumothorax. IMPRESSION: Trace right and small left pleural effusions are slightly increased in size when compared to prior exam. Electronically Signed   By: Yetta Glassman M.D.   On: 09/11/2021 09:42   DG Chest 2 View  Result Date: 08/30/2021 CLINICAL DATA:  Chest pain,  intermittent chest pain to the last month, more severe in frequent yesterday and today. EXAM: CHEST - 2 VIEW COMPARISON:  None FINDINGS: EKG leads project over the chest. Trachea midline. Cardiomediastinal contours and hilar structures are normal. No sign of lobar consolidative process. No visible pneumothorax. On limited assessment there is no acute skeletal process. IMPRESSION: No acute cardiopulmonary disease. Electronically Signed   By: Zetta Bills M.D.   On: 08/30/2021 13:07   CARDIAC CATHETERIZATION  Result Date: 09/01/2021 Conclusions: Severe three-vessel coronary artery disease, as detailed below. Mildly elevated left ventricular filling pressure. Recommendations: Cardiac surgery consultation for CABG. Restart IV heparin 2 hours after TR band removal. Start isosorbide mononitrate and wean off nitroglycerin infusion, as tolerated. Aggressive secondary prevention of coronary artery disease. Nelva Bush, MD [facility]  DG Chest Port 1 View  Result Date: 09/10/2021 CLINICAL DATA:  Chest pain Status post CABG EXAM: PORTABLE CHEST 1 VIEW COMPARISON:  09/09/2021 FINDINGS: Interval removal of Swan-Ganz catheter. Sheath remains in place. Mediastinal and left chest drains have been removed. Postsurgical changes of CABG again seen. Retained epicardial leads are seen. Heart size is within normal limits. No pulmonary vascular congestion. Mild left basilar atelectasis and minimal pleural effusion. IMPRESSION: Minimal left pleural effusion with mild adjacent atelectasis. Electronically Signed   By: Miachel Roux M.D.   On: 09/10/2021 08:48   DG Chest Port 1 View  Result Date: 09/09/2021 CLINICAL DATA:  Status post coronary bypass surgery, chest pain EXAM: PORTABLE CHEST 1 VIEW COMPARISON:  Previous studies including the examination of 09/08/2021 FINDINGS: Transverse diameter of heart is increased. Tip of Swan-Ganz catheter is seen in the region of main pulmonary artery. Left chest tube is noted.  Surgical drains are seen in the mediastinum. Tip of Swan-Ganz catheter is seen in the course of main pulmonary artery. There is interval worsening of infiltrates in the left lower lung fields. There is no demonstrable pneumothorax. IMPRESSION: Cardiomegaly. There is interval increase in infiltrates in the left lower lung fields suggesting worsening atelectasis/pneumonia. There are no signs of pulmonary edema. Possible small left pleural effusion. Other findings as described in the body of the report. Electronically Signed   By: Elmer Picker M.D.   On: 09/09/2021 08:26   DG Chest Port 1 View  Result Date: 09/08/2021 CLINICAL DATA:  Pneumothorax. EXAM: PORTABLE CHEST 1 VIEW COMPARISON:  08/30/2021. FINDINGS: Endotracheal tube terminates 3.8 cm above the carina. Nasogastric tube is followed into the stomach. Right IJ Swan-Ganz catheter tip is in the proximal right pulmonary  artery. Mediastinal drain and 2 left chest tubes are in place. Double to exclude a tiny left apical pneumothorax. Linear subsegmental atelectasis in the left lower lobe. Lungs are otherwise clear. No pleural fluid. IMPRESSION: 1. Difficult to exclude a tiny left apical pneumothorax. 2. Subsegmental left lower lobe atelectasis. Electronically Signed   By: Lorin Picket M.D.   On: 09/08/2021 14:00   ECHOCARDIOGRAM COMPLETE  Result Date: 08/31/2021    ECHOCARDIOGRAM REPORT   Patient Name:   [name] [name] Date of Exam: 08/31/2021 Medical Rec #:  [id number]         Height:       63.0 in Accession #:    [id number]        Weight:       125.0 lb Date of Birth:  Nov 12, 1945         BSA:          1.584 m Patient Age:    41 years          BP:           120/91 mmHg Patient Gender: F                 HR:           83 bpm. Exam Location:  Inpatient Procedure: 2D Echo, Cardiac Doppler and Color Doppler Indications:    NSTEMI I21.4  History:        Patient has no prior history of Echocardiogram examinations.                 Signs/Symptoms:Chest  Pain and Shortness of Breath.  Sonographer:    Merrie Roof RDCS Referring Phys: [id number] [place]  1. Left ventricular ejection fraction, by estimation, is 70 to 75%. The left ventricle has hyperdynamic function. The left ventricle has no regional wall motion abnormalities. There is moderate concentric left ventricular hypertrophy. Left ventricular diastolic parameters are consistent with Grade I diastolic dysfunction (impaired relaxation).  2. Right ventricular systolic function is normal. The right ventricular size is normal. Tricuspid regurgitation signal is inadequate for assessing PA pressure.  3. The mitral valve is normal in structure. Trivial mitral valve regurgitation.  4. The aortic valve is tricuspid. There is mild calcification of the aortic valve. There is mild thickening of the aortic valve. Aortic valve regurgitation is trivial.  5. Aortic dilatation noted. 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  6. The inferior vena cava is normal in size with greater than 50% respiratory variability, suggesting right atrial pressure of 3 mmHg. Comparison(s): No prior Echocardiogram. FINDINGS  Left Ventricle: Left ventricular ejection fraction, by estimation, is 70 to 75%. The left ventricle has hyperdynamic function. The left ventricle has no regional wall motion abnormalities. The left ventricular internal cavity size was normal in size. There is moderate concentric left ventricular hypertrophy. Left ventricular diastolic parameters are consistent with Grade I diastolic dysfunction (impaired relaxation). Right Ventricle: The right ventricular size is normal. No increase in right ventricular wall thickness. Right ventricular systolic function is normal. Tricuspid regurgitation signal is inadequate for assessing PA pressure. Left Atrium: Left atrial size was normal in size. Right Atrium: Right atrial size was normal in size. Pericardium: There is no evidence of  pericardial effusion. Mitral Valve: The mitral valve is normal in structure. There is mild thickening of the mitral valve leaflet(s). There is mild calcification of the mitral valve leaflet(s). Mild mitral annular calcification. Trivial mitral valve regurgitation. Tricuspid Valve:  The tricuspid valve is normal in structure. Tricuspid valve regurgitation is trivial. Aortic Valve: The aortic valve is tricuspid. There is mild calcification of the aortic valve. There is mild thickening of the aortic valve. Aortic valve regurgitation is trivial. Pulmonic Valve: The pulmonic valve was not well visualized. Pulmonic valve regurgitation is trivial. Aorta: Aortic dilatation noted. 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 Venous: The inferior vena cava is normal in size with greater than 50% respiratory variability, suggesting right atrial pressure of 3 mmHg. IAS/Shunts: No atrial level shunt detected by color flow Doppler.  LEFT VENTRICLE PLAX 2D LVIDd:         3.90 cm   Diastology LVIDs:         2.30 cm   LV e' medial:    4.68 cm/s LV PW:         1.30 cm   LV E/e' medial:  12.9 LV IVS:        1.20 cm   LV e' lateral:   8.49 cm/s LVOT diam:     2.00 cm   LV E/e' lateral: 7.1 LV SV:         56 LV SV Index:   35 LVOT Area:     3.14 cm  RIGHT VENTRICLE RV Basal diam:  2.30 cm LEFT ATRIUM             Index        RIGHT ATRIUM           Index LA diam:        2.40 cm 1.52 cm/m   RA Area:     12.30 cm LA Vol (A2C):   34.5 ml 21.79 ml/m  RA Volume:   29.80 ml  18.82 ml/m LA Vol (A4C):   27.5 ml 17.37 ml/m LA Biplane Vol: 32.9 ml 20.78 ml/m  AORTIC VALVE LVOT Vmax:   99.00 cm/s LVOT Vmean:  64.500 cm/s LVOT VTI:    0.178 m  AORTA Ao Root diam: 3.50 cm Ao Asc diam:  4.10 cm MITRAL VALVE MV Area (PHT): 2.99 cm     SHUNTS MV Decel Time: 254 msec     Systemic VTI:  0.18 m MV E velocity: 60.30 cm/s   Systemic Diam: 2.00 cm MV A velocity: 108.00 cm/s MV E/A ratio:  0.56 Gwyndolyn Kaufman MD Electronically  signed by Gwyndolyn Kaufman MD Signature Date/Time: 08/31/2021/1:13:30 PM    Final    ECHO INTRAOPERATIVE TEE  Result Date: 09/08/2021  *INTRAOPERATIVE TRANSESOPHAGEAL REPORT *  Patient Name:   [name] [name] Date of Exam: 09/08/2021 Medical Rec #:  [id number]         Height:       63.0 in Accession #:    [id number]        Weight:       126.8 lb Date of Birth:  1946/07/16         BSA:          1.59 m Patient Age:    54 years          BP:           125/69 mmHg Patient Gender: F                 HR:           56 bpm. Exam Location:  Anesthesiology Transesophogeal exam was perform intraoperatively during surgical procedure. Patient was closely monitored under general anesthesia during the entirety of  examination. Indications:     Coronary Artery Disease Sonographer:     Bernadene Person RDCS Performing Phys: Adele Barthel MD Diagnosing Phys: Adele Barthel MD Complications: No known complications during this procedure. POST-OP IMPRESSIONS Overall, there were no significant changes from pre-bypass. _ Left Ventricle: The left ventricle is unchanged from pre-bypass. _ Right Ventricle: The right ventricle appears unchanged from pre-bypass. _ Aorta: The aorta appears unchanged from pre-bypass. _ Left Atrium: The left atrium appears unchanged from pre-bypass. _ Left Atrial Appendage: The left atrial appendage appears unchanged from pre-bypass. _ Aortic Valve: The aortic valve appears unchanged from pre-bypass. _ Mitral Valve: The mitral valve appears unchanged from pre-bypass. _ Tricuspid Valve: The tricuspid valve appears unchanged from pre-bypass. _ Pulmonic Valve: The pulmonic valve appears unchanged from pre-bypass. _ Interatrial Septum: The interatrial septum appears unchanged from pre-bypass. _ Interventricular Septum: The interventricular septum appears unchanged from pre-bypass. _ Pericardium: The pericardium appears unchanged from pre-bypass. PRE-OP FINDINGS  Left Ventricle: The left ventricle has hyperdynamic  systolic function, with an ejection fraction of >65%. The cavity size was normal. There is moderately increased left ventricular wall thickness. There is moderate concentric left ventricular hypertrophy. Right Ventricle: The right ventricle has normal systolic function. The cavity was normal. There is no increase in right ventricular wall thickness. Left Atrium: Left atrial size was normal in size. No left atrial/left atrial appendage thrombus was detected. Right Atrium: Right atrial size was normal in size. Interatrial Septum: No atrial level shunt detected by color flow Doppler. Pericardium: There is no evidence of pericardial effusion. Mitral Valve: The mitral valve is normal in structure. Mitral valve regurgitation is not visualized by color flow Doppler. Tricuspid Valve: The tricuspid valve was normal in structure. Tricuspid valve regurgitation is trivial by color flow Doppler. Aortic Valve: The aortic valve is normal in structure. Aortic valve regurgitation was not visualized by color flow Doppler. There is no stenosis of the aortic valve. Pulmonic Valve: The pulmonic valve was normal in structure. Pulmonic valve regurgitation is not visualized by color flow Doppler. Aorta: The aortic root, ascending aorta and aortic arch are normal in size and structure. There is evidence of plaque in the descending aorta.  Adele Barthel MD Electronically signed by Adele Barthel MD Signature Date/Time: 09/08/2021/12:28:29 PM    Final    VAS US DOPPLER PRE CABG  Result Date: 09/02/2021 PREOPERATIVE VASCULAR EVALUATION Patient Name:  [name] [name]  Date of Exam:   09/02/2021 Medical Rec #: [id number]          Accession #:    [id number] Date of Birth: 12/20/1945          Patient Gender: F Patient Age:   64 years Exam Location:  [facility] Procedure:      VAS US DOPPLER PRE CABG Referring Phys: Gilford Raid --------------------------------------------------------------------------------  Indications:      Pre-CABG.  Risk Factors:     Hypertension, current smoker, prior MI. Comparison Study: No prior studies. Performing Technologist: Darlin Coco RDMS RVT  Examination Guidelines: A complete evaluation includes B-mode imaging, spectral Doppler, color Doppler, and power Doppler as needed of all accessible portions of each vessel. Bilateral testing is considered an integral part of a complete examination. Limited examinations for reoccurring indications may be performed as noted.  Right Carotid Findings: +----------+--------+--------+--------+------------------+---------------------+           PSV cm/sEDV cm/sStenosisDescribe          Comments              +----------+--------+--------+--------+------------------+---------------------+  CCA Prox  70      14                                                      +----------+--------+--------+--------+------------------+---------------------+ CCA Distal60      12                                intimal thickening    +----------+--------+--------+--------+------------------+---------------------+ ICA Prox  200     57      40-59%  calcific,         Velocities may                                          heterogenous and  underestimate degree                                    hypoechoic        of stenosis due to                                                        more proximal                                                             obstruction.          +----------+--------+--------+--------+------------------+---------------------+ ICA Mid   109     30                                                      +----------+--------+--------+--------+------------------+---------------------+ ICA Distal57      18                                                      +----------+--------+--------+--------+------------------+---------------------+ ECA       102                                                              +----------+--------+--------+--------+------------------+---------------------+ +----------+--------+-------+----------------+------------+           PSV cm/sEDV cmsDescribe        Arm Pressure +----------+--------+-------+----------------+------------+ [id number]            Multiphasic, WNL             +----------+--------+-------+----------------+------------+ +---------+--------+--+--------+--+---------+  VertebralPSV cm/s92EDV cm/s25Antegrade +---------+--------+--+--------+--+---------+ Left Carotid Findings: +----------+--------+--------+--------+--------+--------+           PSV cm/sEDV cm/sStenosisDescribeComments +----------+--------+--------+--------+--------+--------+ CCA Prox  84      13                               +----------+--------+--------+--------+--------+--------+ CCA Distal97      20                               +----------+--------+--------+--------+--------+--------+ ICA Prox  106     36      1-39%   calcific         +----------+--------+--------+--------+--------+--------+ ICA Distal54      16                      tortuous +----------+--------+--------+--------+--------+--------+ ECA       99      14                               +----------+--------+--------+--------+--------+--------+  +----------+--------+--------+----------------+------------+ SubclavianPSV cm/sEDV cm/sDescribe        Arm Pressure +----------+--------+--------+----------------+------------+           100             Multiphasic, WNL             +----------+--------+--------+----------------+------------+ +---------+--------+--+--------+--+---------+ VertebralPSV cm/s45EDV cm/s12Antegrade +---------+--------+--+--------+--+---------+  ABI Findings: +--------+------------------+-----+---------+--------+ Right   Rt Pressure (mmHg)IndexWaveform Comment  +--------+------------------+-----+---------+--------+ [id number]                     triphasic         +--------+------------------+-----+---------+--------+ PTA     151               1.24 triphasic         +--------+------------------+-----+---------+--------+ DP      123               1.01 triphasic         +--------+------------------+-----+---------+--------+ +--------+------------------+-----+---------+-------+ Left    Lt Pressure (mmHg)IndexWaveform Comment +--------+------------------+-----+---------+-------+ [id number]                    triphasic        +--------+------------------+-----+---------+-------+ PTA     153               1.25 triphasic        +--------+------------------+-----+---------+-------+ DP      135               1.11 triphasic        +--------+------------------+-----+---------+-------+  Right Doppler Findings: +--------+--------+-----+---------+--------+ Site    PressureIndexDoppler  Comments +--------+--------+-----+---------+--------+ [id number]          triphasic         +--------+--------+-----+---------+--------+ Radial               triphasic         +--------+--------+-----+---------+--------+ Ulnar                triphasic         +--------+--------+-----+---------+--------+  Left Doppler Findings: +--------+--------+-----+---------+--------+ Site    PressureIndexDoppler  Comments +--------+--------+-----+---------+--------+ [id number]          triphasic         +--------+--------+-----+---------+--------+ Radial  triphasic         +--------+--------+-----+---------+--------+ Ulnar                triphasic         +--------+--------+-----+---------+--------+  Summary: Right Carotid: Velocities in the 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 Left Carotid: Velocities in the left ICA are consistent with a 1-39% stenosis. Vertebrals:  Bilateral vertebral arteries demonstrate antegrade flow. Subclavians: Normal flow hemodynamics were  seen in bilateral subclavian              arteries. Right ABI: Resting right ankle-brachial index is within normal range. No evidence of significant right lower extremity arterial disease. Left ABI: Resting left ankle-brachial index is within normal range. No evidence of significant left lower extremity arterial disease. Right Upper Extremity: Doppler waveforms remain within normal limits with right radial compression. Doppler waveforms remain within normal limits with right ulnar compression. Left Upper Extremity: Doppler waveforms remain within normal limits with left radial compression. Doppler waveforms remain within normal limits with left ulnar compression.  Electronically signed by Waverly Ferrari MD on 09/02/2021 at 2:27:11 PM.    Final     Discharge Instructions     AMB Referral to [facility]   Complete by: As directed    Reason For Referral: Lipids       Discharge Medications: Allergies as of 09/13/2021       Reactions   Budesonide Rash        Medication List     STOP taking these medications    amoxicillin 500 MG capsule Commonly known as: AMOXIL   azaTHIOprine 50 MG tablet Commonly known as: IMURAN       TAKE these medications    acetaminophen 325 MG tablet Commonly known as: TYLENOL Take 2 tablets (650 mg total) by mouth every 6 (six) hours as needed for mild pain.   aspirin 325 MG EC tablet Take 1 tablet (325 mg total) by mouth daily.   ezetimibe 10 MG tablet Commonly known as: ZETIA Take 1 tablet (10 mg total) by mouth daily.   metoprolol succinate 25 MG 24 hr tablet Commonly known as: TOPROL-XL Take 1 tablet (25 mg total) by mouth daily. What changed:  medication strength how much to take additional instructions   sertraline 50 MG tablet Commonly known as: Zoloft Take 1 tablet (50 mg total) by mouth daily.   traMADol 50 MG tablet Commonly known as: ULTRAM Take 1 tablet (50 mg total) by mouth every 4 (four) hours as needed for  moderate pain.      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 Go on 10/15/2021.   Specialty: Cardiothoracic Surgery Why: PA/LAT CXR to be teakn (at [facility] which is in the same building as Dr. Vivi Martens office) on 01/18 at 8:30 am;Appointment time is at 9:00 am Contact information: [address] [place] [address] [phone]         [facility]. Go on 09/23/2021.   Specialty: Cardiothoracic Surgery Why: Appointment is with nurse only for chest tube suture removal. Appointment time is at 11:30 am Contact information: [place], [place] [phone]        Loel Dubonnet, NP. Go on 10/07/2021.   Specialty: Cardiology Why: Appointment time is at 10:05 am Contact information: [place] [address] [phone]         Leighton Ruff, MD Follow up.   Specialty: Family Medicine Why: Please contact office to set up appointment in 1-2 week for depression follow up Contact information: [place] [address] [phone]         Freada Bergeron, MD .   Specialties: Cardiology, Radiology Contact  information: [id number] [place] [address] [phone]                 Signed: Ellamae Sia 09/13/2021, 9:24 AM

## 2021-09-08 NOTE — Interval H&P Note (Signed)
History and Physical Interval Note:  09/08/2021 7:04 AM  [name] [name]  has presented today for surgery, with the diagnosis of CAD.  The various methods of treatment have been discussed with the patient and family. After consideration of risks, benefits and other options for treatment, the patient has consented to  Procedure(s): CORONARY ARTERY BYPASS GRAFTING (CABG) (N/A) TRANSESOPHAGEAL ECHOCARDIOGRAM (TEE) (N/A) as a surgical intervention.  The patient's history has been reviewed, patient examined, no change in status, stable for surgery.  I have reviewed the patient's chart and labs.  Questions were answered to the patient's satisfaction.     Alleen Borne

## 2021-09-08 NOTE — Progress Notes (Signed)
  Transition of Care [facility]) Screening Note   Patient Details  Name: [name] [name] Date of Birth: January 02, 1946   Transition of [facility]) CM/SW Contact:    Delilah Shan, LCSWA Phone Number: 09/08/2021, 4:52 PM    Transition of Care Department [facility]) has reviewed patient and no TOC needs have been identified at this time. We will continue to monitor patient advancement through interdisciplinary progression rounds. If new patient transition needs arise, please place a TOC consult.

## 2021-09-08 NOTE — Anesthesia Procedure Notes (Signed)
Central Venous Catheter Insertion Performed by: Leonides Grills, MD, anesthesiologist Start/End12/08/2021 7:00 AM, 09/08/2021 7:20 AM Patient location: Pre-op. Preanesthetic checklist: patient identified, IV checked, site marked, risks and benefits discussed, surgical consent, monitors and equipment checked, pre-op evaluation, timeout performed and anesthesia consent Position: Trendelenburg Lidocaine 1% used for infiltration and patient sedated Hand hygiene performed , maximum sterile barriers used  and Seldinger technique used Catheter size: 8.5 Fr Total catheter length 10. PA cath was placed.Sheath introducer Swan type:thermodilution PA Cath depth:42 Procedure performed using ultrasound guided technique. Ultrasound Notes:anatomy identified, needle tip was noted to be adjacent to the nerve/plexus identified and no ultrasound evidence of intravascular and/or intraneural injection Attempts: 1 Following insertion, line sutured, dressing applied and Biopatch. Post procedure assessment: blood return through all ports, free fluid flow and no air  Patient tolerated the procedure well with no immediate complications.

## 2021-09-08 NOTE — Op Note (Signed)
CARDIOVASCULAR SURGERY OPERATIVE NOTE  09/08/2021  Surgeon:  Alleen Borne, MD  First Assistant: Doree Fudge,  PA-C:   An experienced assistant was required given the complexity of this surgery and the standard of surgical care. The assistant was needed for exposure, dissection, suctioning, retraction of delicate tissues and sutures, instrument exchange and for overall help during this procedure.   Preoperative Diagnosis:  Severe multi-vessel coronary artery disease   Postoperative Diagnosis:  Same   Procedure:  Median Sternotomy Extracorporeal circulation 3.   Coronary artery bypass grafting x 4  SVG to the LAD SVG to the RCA Sequential SVG to OM1 and OM2  4.   Endoscopic vein harvest from the right leg   Anesthesia:  General Endotracheal   Clinical History/Surgical Indication:  She has severe 3 vessel coronary artery disease presenting with NSTEMI. Echo shows normal LVEF with no significant valvular abnormality. Her coronary arteries are smaller caliber and with 3 vessel disease I think CABG is the best option for treating her.   Preparation:  The patient was seen in the preoperative holding area and the correct patient, correct operation were confirmed with the patient after reviewing the medical record and catheterization. The consent was signed by me. Preoperative antibiotics were given. A pulmonary arterial line and radial arterial line were placed by the anesthesia team. The patient was taken back to the operating room and positioned supine on the operating room table. After being placed under general endotracheal anesthesia by the anesthesia team a foley catheter was placed. The neck, chest, abdomen, and both legs were prepped with betadine soap and solution and draped in the usual sterile manner. A surgical time-out was taken and the correct patient and operative  procedure were confirmed with the nursing and anesthesia staff.   Cardiopulmonary Bypass:  A median sternotomy was performed. The pericardium was opened in the midline. Right ventricular function appeared normal. The ascending aorta was of normal size and had no palpable plaque. There were no contraindications to aortic cannulation or cross-clamping. The patient was fully systemically heparinized and the ACT was maintained > 400 sec. The proximal aortic arch was cannulated with a 20 F aortic cannula for arterial inflow. Venous cannulation was performed via the right atrial appendage using a two-staged venous cannula. An antegrade cardioplegia/vent cannula was inserted into the mid-ascending aorta. Aortic occlusion was performed with a single cross-clamp. Systemic cooling to 32 degrees Centigrade and topical cooling of the heart with iced saline were used. Hyperkalemic antegrade cold blood cardioplegia was used to induce diastolic arrest and was then given at about 20 minute intervals throughout the period of arrest to maintain myocardial temperature at or below 10 degrees centigrade. A temperature probe was inserted into the interventricular septum and an insulating pad was placed in the pericardium.   Left internal mammary artery harvest:  The left side of the sternum was retracted using the Rultract retractor. The left internal mammary artery was harvested as a pedicle graft. All side branches were clipped. It was a small-sized vessel of good quality with marginal blood flow. It was ligated distally and divided. It was sprayed with topical papaverine solution to prevent vasospasm. I checked the flow in the vessel multiple times and it remained marginal. The vessel was fairly uniform in size but even going up higher on the vessel the flow was marginal and I did not think it was suitable for a bypass graft to the LAD. Therefore it was divided proximally and discarded. The stump was ligated  with a 2-0 silk  suture.   Endoscopic vein harvest:  The right greater saphenous vein was harvested endoscopically through a 2 cm incision medial to the right knee. It was harvested from the upper thigh to the lower leg.  It was a medium-sized vein of good quality. The side branches were all ligated with 4-0 silk ties.    Coronary arteries:  The coronary arteries were examined.  LAD:  small caliber with segmental distal disease. The diagonal branches were tiny. LCX:  OM1 was a medium caliber vessel with no distal disease.The OM2 was a small vessel with no distal disease and borderline size for grafting. RCA:  medium caliber with segmental distal disease. PDA was small and PL branches were tiny.   Grafts:  SVG to the LAD: 1.6 mm. It was sewn end to side using 8-0 prolene continuous suture. SVG to RCA:  1.6 mm. It was sewn end to side using 7-0 prolene continuous suture. Sequential SVG to OM1:  1.6 mm. It was sewn sequential side to side using 7-0 prolene continuous suture. Sequential SVG to OM2:  1.5 mm. It was sewn sequential end to side using 7-0 prolene continuous suture.  The proximal vein graft anastomoses were performed to the mid-ascending aorta using continuous 6-0 prolene suture. Graft markers were placed around the proximal anastomoses.   Completion:  The patient was rewarmed to 37 degrees Centigrade. The crossclamp was removed with a time of 96 minutes. There was spontaneous return of sinus rhythm. The distal and proximal anastomoses were checked for hemostasis. The position of the grafts was satisfactory. Two temporary epicardial pacing wires were placed on the right atrium and two on the right ventricle. The patient was weaned from CPB without difficulty on no inotropes. CPB time was 115 minutes. Cardiac output was 5 LPM. TEE showed normal LV systolic function. Heparin was fully reversed with protamine and the aortic and venous cannulas removed. Hemostasis was achieved. Mediastinal and left  pleural drainage tubes were placed. The sternum was closed with #6 stainless steel wires. The fascia was closed with continuous # 1 vicryl suture. The subcutaneous tissue was closed with 2-0 vicryl continuous suture. The skin was closed with 3-0 vicryl subcuticular suture. All sponge, needle, and instrument counts were reported correct at the end of the case. Dry sterile dressings were placed over the incisions and around the chest tubes which were connected to pleurevac suction. The patient was then transported to the surgical intensive care unit in stable condition.

## 2021-09-08 NOTE — Anesthesia Procedure Notes (Signed)
Procedure Name: Intubation Date/Time: 09/08/2021 7:59 AM Performed by: Vonna Drafts, CRNA Pre-anesthesia Checklist: Patient identified, Emergency Drugs available, Suction available and Patient being monitored Patient Re-evaluated:Patient Re-evaluated prior to induction Oxygen Delivery Method: Circle system utilized Preoxygenation: Pre-oxygenation with 100% oxygen Induction Type: IV induction Ventilation: Mask ventilation without difficulty Laryngoscope Size: Mac and 4 Grade View: Grade I Tube type: Oral Tube size: 8.0 mm Number of attempts: 1 Airway Equipment and Method: Stylet Placement Confirmation: ETT inserted through vocal cords under direct vision, positive ETCO2 and breath sounds checked- equal and bilateral Secured at: 23 cm Tube secured with: Tape Dental Injury: Teeth and Oropharynx as per pre-operative assessment

## 2021-09-08 NOTE — Anesthesia Procedure Notes (Signed)
Arterial Line Insertion Start/End12/08/2021 7:20 AM, 09/08/2021 7:30 AM Performed by: Leonides Grills, MD, anesthesiologist  Patient location: Pre-op. Preanesthetic checklist: patient identified, IV checked, site marked, risks and benefits discussed, surgical consent, monitors and equipment checked, pre-op evaluation, timeout performed and anesthesia consent Lidocaine 1% used for infiltration and patient sedated Left, radial was placed Catheter size: 20 G Hand hygiene performed , maximum sterile barriers used  and Seldinger technique used  Attempts: 1 (Previous attempts by CRNA) Procedure performed using ultrasound guided technique. Ultrasound Notes:anatomy identified, needle tip was noted to be adjacent to the nerve/plexus identified and no ultrasound evidence of intravascular and/or intraneural injection Following insertion, dressing applied and Biopatch. Post procedure assessment: normal and unchanged  Post procedure complications: second provider assisted. Patient tolerated the procedure well with no immediate complications.

## 2021-09-08 NOTE — Discharge Instructions (Signed)

## 2021-09-08 NOTE — Transfer of Care (Signed)
Immediate Anesthesia Transfer of Care Note  Patient: [name] [name]  Procedure(s) Performed: CORONARY ARTERY BYPASS GRAFTING (CABG) X 4  USING LEFT INTERNAL MAMMARY ARTERY AND RIGHT ENDOSCOPIC GREATER SAPHENOUS VEIN CONDUITS. (Chest) TRANSESOPHAGEAL ECHOCARDIOGRAM (TEE) APPLICATION OF CELL SAVER ENDOVEIN HARVEST OF GREATER SAPHENOUS VEIN (Right)  Patient Location: SICU  Anesthesia Type:General  Level of Consciousness: unresponsive and Patient remains intubated per anesthesia plan  Airway & Oxygen Therapy: Patient remains intubated per anesthesia plan and Patient placed on Ventilator (see vital sign flow sheet for setting)  Post-op Assessment: Report given to RN and Post -op Vital signs reviewed and stable  Post vital signs: Reviewed and stable  Last Vitals:  Vitals Value Taken Time  BP    Temp    Pulse 80 09/08/21 1259  Resp 12 09/08/21 1259  SpO2 100 % 09/08/21 1259  Vitals shown include unvalidated device data.  Last Pain:  Vitals:   09/08/21 0309  TempSrc: Oral  PainSc:          Complications: No notable events documented.

## 2021-09-08 NOTE — Brief Op Note (Addendum)
08/30/2021 - 09/08/2021  11:11 AM  PATIENT:  Jimmy Footman Elenbaas  75 y.o. female  PRE-OPERATIVE DIAGNOSIS:  1. S/p NSTEMI 2.CORONARY ARTERY DISEASE  POST-OPERATIVE DIAGNOSIS:  1. S/p NSTEMI 2.CORONARY ARTERY DISEASE  PROCEDURE:TRANSESOPHAGEAL ECHOCARDIOGRAM (TEE), CORONARY ARTERY BYPASS GRAFTING (CABG) X  4 (SVG to LAD, SVG SEQUENTIALLY to OM1 and OM2, SV to PDA) USING LEFT INTERNAL MAMMARY ARTERY AND RIGHT GREATER SAPHENOUS VEIN HARVESTED ENDOSCOPICALLY, APPLICATION OF CELL SAVER FINDINGS: LIMA small so SVG used to LAD RIGHT EVH HARVEST TIME: 38 minutes;RIGHT EVH PREP TIME: 15 minutes  SURGEON:  Surgeon(s) and Role:    Alleen Borne, MD - Primary  PHYSICIAN ASSISTANT: Doree Fudge PA-C  ASSISTANTS: Benay Spice RNFA  ANESTHESIA:   general  EBL:  Per perfusion, anesthesia record  DRAINS:  Chest tubes placed in the mediastinal and pleural spaces    COUNTS CORRECT:  YES  DICTATION: .Dragon Dictation  PLAN OF CARE: Admit to inpatient   PATIENT DISPOSITION:  ICU - intubated and hemodynamically stable.   Delay start of Pharmacological VTE agent (>24hrs) due to surgical blood loss or risk of bleeding: yes   BASELINE WEIGHT: 57.5 kg

## 2021-09-08 NOTE — Anesthesia Postprocedure Evaluation (Signed)
Anesthesia Post Note  Patient: [name] [name]  Procedure(s) Performed: CORONARY ARTERY BYPASS GRAFTING (CABG) X 4  USING LEFT INTERNAL MAMMARY ARTERY AND RIGHT ENDOSCOPIC GREATER SAPHENOUS VEIN CONDUITS. (Chest) TRANSESOPHAGEAL ECHOCARDIOGRAM (TEE) APPLICATION OF CELL SAVER ENDOVEIN HARVEST OF GREATER SAPHENOUS VEIN (Right)     Patient location during evaluation: SICU Anesthesia Type: General Level of consciousness: sedated Pain management: pain level controlled Vital Signs Assessment: post-procedure vital signs reviewed and stable Respiratory status: patient remains intubated per anesthesia plan Cardiovascular status: stable Postop Assessment: no apparent nausea or vomiting Anesthetic complications: no   No notable events documented.  Last Vitals:  Vitals:   09/08/21 1528 09/08/21 1530  BP: 132/82 116/73  Pulse: 80 80  Resp: 18 17  Temp:  (!) 35.9 C  SpO2: 100% 100%    Last Pain:  Vitals:   09/08/21 0309  TempSrc: Oral  PainSc:                  Catheryn Bacon [place]

## 2021-09-08 NOTE — Progress Notes (Signed)
  Echocardiogram Echocardiogram Transesophageal has been performed.  Augustine Radar 09/08/2021, 8:38 AM

## 2021-09-08 NOTE — Progress Notes (Signed)
EVENING ROUNDS NOTE :     [address]       [place] [address]             [phone]                 Day of Surgery Procedure(s) (LRB): CORONARY ARTERY BYPASS GRAFTING (CABG) X 4  USING LEFT INTERNAL MAMMARY ARTERY AND RIGHT ENDOSCOPIC GREATER SAPHENOUS VEIN CONDUITS. (N/A) TRANSESOPHAGEAL ECHOCARDIOGRAM (TEE) (N/A) APPLICATION OF CELL SAVER ENDOVEIN HARVEST OF GREATER SAPHENOUS VEIN (Right)   Total Length of Stay:  LOS: 9 days  Events:   Weaning to extubate Low CT output Low neo    BP (!) 117/59   Pulse 80   Temp 97.9 F (36.6 C)   Resp 16   Ht 5\' 3"  (1.6 m)   Wt 57.5 kg   LMP  (LMP Unknown)   SpO2 100%   BMI 22.46 kg/m   PAP: (18-32)/(6-17) 22/10 CO:  [1.9 L/min-3.3 L/min] 3.3 L/min CI:  [1.2 L/min/m2-2 L/min/m2] 2 L/min/m2  Vent Mode: PSV;CPAP FiO2 (%):  [40 %-50 %] 40 % Set Rate:  [4 bmp-18 bmp] 4 bmp Vt Set:  [410 mL] 410 mL PEEP:  [5 cmH20] 5 cmH20 Pressure Support:  [10 cmH20] 10 cmH20 Plateau Pressure:  [13 cmH20-15 cmH20] 15 cmH20   sodium chloride 20 mL/hr at 09/08/21 1700   sodium chloride     [START ON 09/09/2021] sodium chloride     sodium chloride 20 mL/hr at 09/08/21 1332   albumin human 12.5 g (09/08/21 1346)    ceFAZolin (ANCEF) IV Stopped (09/08/21 1422)   dexmedetomidine (PRECEDEX) IV infusion Stopped (09/08/21 1434)   famotidine (PEPCID) IV Stopped (09/08/21 1504)   insulin Stopped (09/08/21 1609)   lactated ringers     lactated ringers     lactated ringers 20 mL/hr at 09/08/21 1255   magnesium sulfate 20 mL/hr at 09/08/21 1700   nitroGLYCERIN Stopped (09/08/21 1336)   phenylephrine (NEO-SYNEPHRINE) Adult infusion 10 mcg/min (09/08/21 1700)   potassium chloride     vancomycin      I/O last 3 completed shifts: In: 314.1 [P.O.:120; I.V.:194.1] Out: 500 [Urine:500]   CBC Latest Ref Rng & Units 09/08/2021 09/08/2021 09/08/2021  WBC 4.0 - 10.5 K/uL 11.1(H) - -  Hemoglobin 12.0 - 15.0 g/dL 11.3(L) 9.5(L)  8.8(L)  Hematocrit 36.0 - 46.0 % 34.5(L) 28.0(L) 26.0(L)  Platelets 150 - 400 K/uL 113(L) - -    BMP Latest Ref Rng & Units 09/08/2021 09/08/2021 09/08/2021  Glucose 70 - 99 mg/dL 14/08/2021) - -  BUN 8 - 23 mg/dL 20 - -  Creatinine 466(Z - 1.00 mg/dL 9.93 - -  Sodium 5.70 - 145 mmol/L 136 137 135  Potassium 3.5 - 5.1 mmol/L 5.2(H) 5.2(H) 5.2(H)  Chloride 98 - 111 mmol/L 102 - -  CO2 22 - 32 mmol/L - - -  Calcium 8.9 - 10.3 mg/dL - - -    ABG    Component Value Date/Time   PHART 7.438 09/08/2021 1153   PCO2ART 38.2 09/08/2021 1153   PO2ART 375 (H) 09/08/2021 1153   HCO3 25.8 09/08/2021 1153   TCO2 25 09/08/2021 1157   O2SAT 100.0 09/08/2021 1153       14/08/2021, MD 09/08/2021 5:10 PM

## 2021-09-08 NOTE — Procedures (Signed)
Extubation Procedure Note  Patient Details:   Name: [name] [name] DOB: [birth year] MRN: [id number]   Airway Documentation:    Vent end date: 09/08/21 Vent end time: 1717   Evaluation  O2 sats: stable throughout Complications: No apparent complications Patient did tolerate procedure well. Bilateral Breath Sounds: Clear   Yes  Patient was extubated to a 3L [name] without any complications, dyspnea or stridor noted. VC: 1L, NIF: -15 with poor effort (Dr. Cliffton Asters made aware and gave verbal orders to continue with extubation. Positive cuff leak prior to extubation.   Carlynn Spry 09/08/2021, 5:17 PM

## 2021-09-08 NOTE — Plan of Care (Signed)

## 2021-09-09 ENCOUNTER — Encounter (HOSPITAL_COMMUNITY): Payer: Self-pay | Admitting: Surgery

## 2021-09-09 ENCOUNTER — Inpatient Hospital Stay (HOSPITAL_COMMUNITY): Payer: Medicare HMO

## 2021-09-09 LAB — BASIC METABOLIC PANEL
Anion gap: 6 (ref 5–15)
Anion gap: 7 (ref 5–15)
BUN: 15 mg/dL (ref 8–23)
BUN: 15 mg/dL (ref 8–23)
CO2: 23 mmol/L (ref 22–32)
CO2: 26 mmol/L (ref 22–32)
Calcium: 7.2 mg/dL — ABNORMAL LOW (ref 8.9–10.3)
Calcium: 7.6 mg/dL — ABNORMAL LOW (ref 8.9–10.3)
Chloride: 105 mmol/L (ref 98–111)
Chloride: 102 mmol/L (ref 98–111)
Creatinine, Ser: 0.77 mg/dL (ref 0.44–1.00)
Creatinine, Ser: 0.8 mg/dL (ref 0.44–1.00)
GFR, Estimated: >60 mL/min (ref >60)
GFR, Estimated: >60 mL/min (ref >60)
Glucose, Bld: 189 mg/dL — ABNORMAL HIGH (ref 70–99)
Glucose, Bld: 149 mg/dL — ABNORMAL HIGH (ref 70–99)
Potassium: 3.7 mmol/L (ref 3.5–5.1)
Potassium: 3.7 mmol/L (ref 3.5–5.1)
Sodium: 134 mmol/L — ABNORMAL LOW (ref 135–145)
Sodium: 135 mmol/L (ref 135–145)

## 2021-09-09 LAB — MAGNESIUM
Magnesium: 2.6 mg/dL — ABNORMAL HIGH (ref 1.7–2.4)
Magnesium: 2.4 mg/dL (ref 1.7–2.4)

## 2021-09-09 LAB — BPAM PLATELET PHERESIS
Blood Product Expiration Date: 202212132359
ISSUE DATE / TIME: 202212121147
Unit Type and Rh: 6200

## 2021-09-09 LAB — PREPARE PLATELET PHERESIS: Unit division: 0

## 2021-09-09 LAB — CBC
HCT: 27.2 % — ABNORMAL LOW (ref 36.0–46.0)
HCT: 26.5 % — ABNORMAL LOW (ref 36.0–46.0)
Hemoglobin: 9.1 g/dL — ABNORMAL LOW (ref 12.0–15.0)
Hemoglobin: 8.9 g/dL — ABNORMAL LOW (ref 12.0–15.0)
MCH: 32.3 pg (ref 26.0–34.0)
MCH: 33.1 pg (ref 26.0–34.0)
MCHC: 33.5 g/dL (ref 30.0–36.0)
MCHC: 33.6 g/dL (ref 30.0–36.0)
MCV: 96.5 fL (ref 80.0–100.0)
MCV: 98.5 fL (ref 80.0–100.0)
Platelets: 95 10*3/uL — ABNORMAL LOW (ref 150–400)
Platelets: 86 10*3/uL — ABNORMAL LOW (ref 150–400)
RBC: 2.82 MIL/uL — ABNORMAL LOW (ref 3.87–5.11)
RBC: 2.69 MIL/uL — ABNORMAL LOW (ref 3.87–5.11)
RDW: 13.4 % (ref 11.5–15.5)
RDW: 13.7 % (ref 11.5–15.5)
WBC: 9 10*3/uL (ref 4.0–10.5)
WBC: 9.4 10*3/uL (ref 4.0–10.5)
nRBC: 0 % (ref 0.0–0.2)
nRBC: 0 % (ref 0.0–0.2)

## 2021-09-09 LAB — GLUCOSE, CAPILLARY
Glucose-Capillary: 107 mg/dL — ABNORMAL HIGH (ref 70–99)
Glucose-Capillary: 123 mg/dL — ABNORMAL HIGH (ref 70–99)
Glucose-Capillary: 133 mg/dL — ABNORMAL HIGH (ref 70–99)
Glucose-Capillary: 175 mg/dL — ABNORMAL HIGH (ref 70–99)
Glucose-Capillary: 81 mg/dL (ref 70–99)

## 2021-09-09 MED ORDER — POTASSIUM CHLORIDE CRYS ER 20 MEQ PO TBCR
40.0000 meq | EXTENDED_RELEASE_TABLET | Freq: Once | ORAL | Status: AC
  Administered 2021-09-09: 40 meq via ORAL
  Filled 2021-09-09: qty 2

## 2021-09-09 MED ORDER — FUROSEMIDE 10 MG/ML IJ SOLN
40.0000 mg | Freq: Once | INTRAMUSCULAR | Status: AC
  Administered 2021-09-09: 40 mg via INTRAVENOUS
  Filled 2021-09-09: qty 4

## 2021-09-09 MED ORDER — ALBUMIN HUMAN 5 % IV SOLN
INTRAVENOUS | Status: AC
  Filled 2021-09-09: qty 250

## 2021-09-09 MED FILL — Thrombin (Recombinant) For Soln 20000 Unit: CUTANEOUS | Qty: 1 | Status: AC

## 2021-09-09 NOTE — Progress Notes (Signed)
Patient ID: [name] [name], female   DOB: 11/01/1945, 75 y.o.   MRN: [id number] [facility] Evening Rounds:  Hemodynamically stable in sinus rhythm.  Good urine output.   BMET    Component Value Date/Time   NA 135 09/09/2021 1619   K 3.7 09/09/2021 1619   CL 102 09/09/2021 1619   CO2 26 09/09/2021 1619   GLUCOSE 149 (H) 09/09/2021 1619   BUN 15 09/09/2021 1619   CREATININE 0.80 09/09/2021 1619   CALCIUM 7.6 (L) 09/09/2021 1619   GFRNONAA >60 09/09/2021 1619

## 2021-09-09 NOTE — Progress Notes (Signed)
1 Day Post-Op Procedure(s) (LRB): CORONARY ARTERY BYPASS GRAFTING (CABG) X 4  USING LEFT INTERNAL MAMMARY ARTERY AND RIGHT ENDOSCOPIC GREATER SAPHENOUS VEIN CONDUITS. (N/A) TRANSESOPHAGEAL ECHOCARDIOGRAM (TEE) (N/A) APPLICATION OF CELL SAVER ENDOVEIN HARVEST OF GREATER SAPHENOUS VEIN (Right) Subjective: Complains of incisional pain.   Objective: Vital signs in last 24 hours: Temp:  [95.4 F (35.2 C)-99.1 F (37.3 C)] 99.1 F (37.3 C) (12/13 0500) Pulse Rate:  [60-80] 66 (12/13 0500) Cardiac Rhythm: Normal sinus rhythm;Sinus bradycardia;Sinus tachycardia (12/13 0400) Resp:  [11-26] 16 (12/13 0500) BP: (83-134)/(48-91) 90/53 (12/13 0500) SpO2:  [92 %-100 %] 92 % (12/13 0500) Arterial Line BP: (75-137)/(42-84) 90/42 (12/13 0500) FiO2 (%):  [40 %-50 %] 40 % (12/12 1615) Weight:  [63.2 kg] 63.2 kg (12/13 0500)  Hemodynamic parameters for last 24 hours: PAP: (14-32)/(1-17) 17/3 CO:  [1.9 L/min-4 L/min] 3.4 L/min CI:  [1.2 L/min/m2-2.5 L/min/m2] 2.1 L/min/m2  Intake/Output from previous day: 12/12 0701 - 12/13 0700 In: 4324.7 [I.V.:2605.8; Blood:674; IV Piggyback:1044.9] Out: 3670 [Urine:2400; Blood:750; Chest Tube:520] Intake/Output this shift: Total I/O In: 768 [I.V.:410.8; IV Piggyback:357.1] Out: 755 [Urine:545; Chest Tube:210]  General appearance: alert and cooperative Neurologic: intact Heart: regular rate and rhythm, S1, S2 normal, no murmur Lungs: clear to auscultation bilaterally Extremities: edema mild Wound: dressing dry  Lab Results: Recent Labs    09/08/21 1824 09/08/21 1831 09/09/21 0535  WBC 15.1*  --  9.0  HGB 10.7* 10.9* 9.1*  HCT 32.7* 32.0* 27.2*  PLT 152  --  PENDING   BMET:  Recent Labs    09/08/21 1824 09/08/21 1831 09/09/21 0535  NA 136 140 134*  K 4.3 4.3 3.7  CL 108  --  105  CO2 22  --  23  GLUCOSE 173*  --  189*  BUN 17  --  15  CREATININE 0.77  --  0.77  CALCIUM 7.2*  --  7.2*    PT/INR:  Recent Labs    09/08/21 1318   LABPROT 15.5*  INR 1.2   ABG    Component Value Date/Time   PHART 7.379 09/08/2021 1831   HCO3 25.4 09/08/2021 1831   TCO2 27 09/08/2021 1831   ACIDBASEDEF 1.0 09/08/2021 1401   O2SAT 98.0 09/08/2021 1831   CBG (last 3)  Recent Labs    09/08/21 2106 09/08/21 2217 09/09/21 0535  GLUCAP 163* 141* 107*   CXR: clear  ECG: pending  Assessment/Plan: S/P Procedure(s) (LRB): CORONARY ARTERY BYPASS GRAFTING (CABG) X 4  USING LEFT INTERNAL MAMMARY ARTERY AND RIGHT ENDOSCOPIC GREATER SAPHENOUS VEIN CONDUITS. (N/A) TRANSESOPHAGEAL ECHOCARDIOGRAM (TEE) (N/A) APPLICATION OF CELL SAVER ENDOVEIN HARVEST OF GREATER SAPHENOUS VEIN (Right)  POD 1 Hemodynamically stable in sinus rhythm. HR 68 so will hold Lopressor for now.  Volume excess: wt is about 12 lbs over preop. Start diuresis today.  DC chest tubes, swan, arterial line.  Glucose under good control. Transitioned to SSI.  IS, OOB, mobilize.   LOS: 10 days    Alleen Borne 09/09/2021

## 2021-09-09 NOTE — Hospital Course (Signed)
HPI: [name] [name] is a 75 year old female with past history hypertension, tobacco use, positive family history for cardiac disease, and history of autoimmune hepatitis treated with azathioprine.  She takes frequent walks in the park and recently noticed having exertional chest discomfort during her afternoon walks.  She had an episode of vague chest discomfort on 08/30/2021 which prompted her visit to [facility].  EKG showed sinus rhythm heart rate of 70 and evidence of an inferior infarct although age cannot be determined.  Initial troponin was elevated at greater than 1100.  Chest x-ray was unremarkable.  She was transferred and admitted to [facility] for additional evaluation and management.  She is started on heparin and nitroglycerin drips had no further chest discomfort.  She had an echocardiogram demonstrating trivial mitral insufficiency and trivial aortic regurgitation.  Her ejection fraction was estimated at 70 to 75% with some LV hypertrophy noted.  Ascending aorta measured at 37 mm.  This morning, she had left heart catheterization demonstrating three-vessel coronary artery disease.  Primary lesions identified included a 50% left main stenosis.  The left anterior descending coronary artery had a 90% mid stenosis.  The circumflex coronary artery had a 70% ostial stenosis, and the RCA had a 90% mid stenosis.  She has remained stable following left heart catheterization.  Nitroglycerin infusion was resumed but she has since been converted to an oral nitrate.  Heparin is scheduled to resume later today.  She remains pain-free. CT surgery has been asked to evaluate [name] [name] for consideration of coronary bypass grafting.    [name] [name] is retired but she continues to be very active.  She was walking 7 miles a day but started having knee trouble and has reduced her daily walks down to 2 miles each morning and 2 miles each afternoon.  She admits that she is this most of the time and has  had stress with traditional management with anxiolytics or SSRIs.  She admits that her smoking has increased since she retired and she is reluctant to agree to have heart surgery because she knows she will be asked to quit smoking but she says "it is one of the few things I enjoy".   She has a history of autoimmune hepatitis that was initially managed by gastroenterology here locally.  She has since transferred care down to the department of gastroenterology at [facility] and is cared for by Dr. Corky Sing.  She has been on azathioprine for about 3 years and on recent visits to Dr. Corky Sing, he has discussed tapering or discontinuing that medication since her liver enzymes have been near normal for a few years now.   In regards to her dentition, her lower teeth are in good repair.  She has 6 remaining teeth in her upper jaw.  The far left tooth has some decay and is loose.  She is currently taking amoxicillin for this. Dr. Laneta Simmers discussed the need for coronary artery bypass grafting surgery. Potential risks, benefits, and complications of the surgery were discussed ant the patient agreed to proceed with surgery. Azathioprine was held. Carotid duplex US showed no significant left internal carotid artery stenosis and a 40-59% right internal carotid artery stenosis.

## 2021-09-10 ENCOUNTER — Inpatient Hospital Stay (HOSPITAL_COMMUNITY): Payer: Medicare HMO

## 2021-09-10 LAB — CBC
HCT: 26 % — ABNORMAL LOW (ref 36.0–46.0)
Hemoglobin: 8.7 g/dL — ABNORMAL LOW (ref 12.0–15.0)
MCH: 32.1 pg (ref 26.0–34.0)
MCHC: 33.5 g/dL (ref 30.0–36.0)
MCV: 95.9 fL (ref 80.0–100.0)
Platelets: 90 10*3/uL — ABNORMAL LOW (ref 150–400)
RBC: 2.71 MIL/uL — ABNORMAL LOW (ref 3.87–5.11)
RDW: 13.7 % (ref 11.5–15.5)
WBC: 10.3 10*3/uL (ref 4.0–10.5)
nRBC: 0 % (ref 0.0–0.2)

## 2021-09-10 LAB — BASIC METABOLIC PANEL
Anion gap: 6 (ref 5–15)
BUN: 20 mg/dL (ref 8–23)
CO2: 25 mmol/L (ref 22–32)
Calcium: 7.8 mg/dL — ABNORMAL LOW (ref 8.9–10.3)
Chloride: 99 mmol/L (ref 98–111)
Creatinine, Ser: 0.82 mg/dL (ref 0.44–1.00)
GFR, Estimated: >60 mL/min (ref >60)
Glucose, Bld: 72 mg/dL (ref 70–99)
Potassium: 3.8 mmol/L (ref 3.5–5.1)
Sodium: 130 mmol/L — ABNORMAL LOW (ref 135–145)

## 2021-09-10 LAB — GLUCOSE, CAPILLARY
Glucose-Capillary: 74 mg/dL (ref 70–99)
Glucose-Capillary: 107 mg/dL — ABNORMAL HIGH (ref 70–99)
Glucose-Capillary: 119 mg/dL — ABNORMAL HIGH (ref 70–99)

## 2021-09-10 MED ORDER — SODIUM CHLORIDE 0.9 % IV SOLN: 250.0000 mL | INTRAVENOUS | Status: DC | PRN

## 2021-09-10 MED ORDER — SODIUM CHLORIDE 0.9% FLUSH
3.0000 mL | Freq: Two times a day (BID) | INTRAVENOUS | Status: DC
  Administered 2021-09-10 – 2021-09-13 (×6): 3 mL via INTRAVENOUS

## 2021-09-10 MED ORDER — ACETAMINOPHEN 325 MG PO TABS
650.0000 mg | ORAL_TABLET | Freq: Four times a day (QID) | ORAL | Status: DC | PRN
  Administered 2021-09-11 – 2021-09-13 (×5): 650 mg via ORAL
  Filled 2021-09-10 (×5): qty 2

## 2021-09-10 MED ORDER — ~~LOC~~ CARDIAC SURGERY, PATIENT & FAMILY EDUCATION: Freq: Once | Status: AC

## 2021-09-10 MED ORDER — OXYCODONE HCL 5 MG PO TABS
5.0000 mg | ORAL_TABLET | ORAL | Status: DC | PRN
  Administered 2021-09-12: 10 mg via ORAL
  Filled 2021-09-10 (×2): qty 1

## 2021-09-10 MED ORDER — TRAMADOL HCL 50 MG PO TABS
50.0000 mg | ORAL_TABLET | Freq: Four times a day (QID) | ORAL | Status: DC | PRN
  Administered 2021-09-12: 50 mg via ORAL
  Filled 2021-09-10: qty 1

## 2021-09-10 MED ORDER — FUROSEMIDE 40 MG PO TABS
40.0000 mg | ORAL_TABLET | Freq: Every day | ORAL | Status: AC
  Administered 2021-09-11 – 2021-09-13 (×3): 40 mg via ORAL
  Filled 2021-09-10 (×3): qty 1

## 2021-09-10 MED ORDER — POTASSIUM CHLORIDE CRYS ER 20 MEQ PO TBCR
20.0000 meq | EXTENDED_RELEASE_TABLET | Freq: Two times a day (BID) | ORAL | Status: DC
  Administered 2021-09-11 – 2021-09-13 (×5): 20 meq via ORAL
  Filled 2021-09-10 (×5): qty 1

## 2021-09-10 MED ORDER — SENNOSIDES-DOCUSATE SODIUM 8.6-50 MG PO TABS: 1.0000 | ORAL_TABLET | Freq: Two times a day (BID) | ORAL | Status: DC | PRN

## 2021-09-10 MED ORDER — ASPIRIN EC 325 MG PO TBEC
325.0000 mg | DELAYED_RELEASE_TABLET | Freq: Every day | ORAL | Status: DC
  Administered 2021-09-11 – 2021-09-13 (×3): 325 mg via ORAL
  Filled 2021-09-10 (×3): qty 1

## 2021-09-10 MED ORDER — ONDANSETRON HCL 4 MG PO TABS: 4.0000 mg | ORAL_TABLET | Freq: Four times a day (QID) | ORAL | Status: DC | PRN

## 2021-09-10 MED ORDER — SODIUM CHLORIDE 0.9% FLUSH: 3.0000 mL | INTRAVENOUS | Status: DC | PRN

## 2021-09-10 MED ORDER — PANTOPRAZOLE SODIUM 40 MG PO TBEC
40.0000 mg | DELAYED_RELEASE_TABLET | Freq: Every day | ORAL | Status: DC
  Administered 2021-09-11 – 2021-09-13 (×3): 40 mg via ORAL
  Filled 2021-09-10 (×3): qty 1

## 2021-09-10 MED ORDER — POTASSIUM CHLORIDE CRYS ER 20 MEQ PO TBCR
30.0000 meq | EXTENDED_RELEASE_TABLET | Freq: Once | ORAL | Status: AC
  Administered 2021-09-10: 10:00:00 30 meq via ORAL
  Filled 2021-09-10: qty 1

## 2021-09-10 MED ORDER — ONDANSETRON HCL 4 MG/2ML IJ SOLN
4.0000 mg | Freq: Four times a day (QID) | INTRAMUSCULAR | Status: DC | PRN
  Administered 2021-09-10: 17:00:00 4 mg via INTRAVENOUS
  Filled 2021-09-10: qty 2

## 2021-09-10 MED ORDER — FUROSEMIDE 40 MG PO TABS
40.0000 mg | ORAL_TABLET | Freq: Every day | ORAL | Status: DC
Start: 2021-09-10 — End: 2021-09-10

## 2021-09-10 MED ORDER — POTASSIUM CHLORIDE CRYS ER 20 MEQ PO TBCR: 20.0000 meq | EXTENDED_RELEASE_TABLET | Freq: Two times a day (BID) | ORAL | Status: DC

## 2021-09-10 MED FILL — Mannitol IV Soln 20%: INTRAVENOUS | Qty: 500 | Status: AC

## 2021-09-10 MED FILL — Electrolyte-R (PH 7.4) Solution: INTRAVENOUS | Qty: 4000 | Status: AC

## 2021-09-10 MED FILL — Sodium Bicarbonate IV Soln 8.4%: INTRAVENOUS | Qty: 50 | Status: AC

## 2021-09-10 MED FILL — Sodium Chloride IV Soln 0.9%: INTRAVENOUS | Qty: 1000 | Status: AC

## 2021-09-10 MED FILL — Heparin Sodium (Porcine) Inj 1000 Unit/ML: Qty: 1000 | Status: AC

## 2021-09-10 MED FILL — Potassium Chloride Inj 2 mEq/ML: INTRAVENOUS | Qty: 40 | Status: AC

## 2021-09-10 MED FILL — Magnesium Sulfate Inj 50%: INTRAMUSCULAR | Qty: 10 | Status: AC

## 2021-09-10 MED FILL — Lidocaine HCl(Cardiac) IV PF Soln Pref Syr 100 MG/5ML (2%): INTRAVENOUS | Qty: 5 | Status: AC

## 2021-09-10 NOTE — Progress Notes (Signed)
Pt transferred to 4E16 at this time. All personal belongings gathered at this time. No needs identified at the moment.

## 2021-09-10 NOTE — Progress Notes (Signed)
Patient arrived from [facility] to 4e16. Patient vital signs obtained and patient placed on monitor CCMD verified. Patient assisted to bed call bell within reach. Zaila Crew, Randall An RN

## 2021-09-10 NOTE — Progress Notes (Signed)
Cardiology Progress Note  Patient ID: [name] [name] MRN: [id number] DOB: 05-09-1946 Date of Encounter: 09/10/2021  Primary Cardiologist: Freada Bergeron, MD  Subjective   Chief Complaint: Chest soreness.   HPI: Recovering well from surgery.  Reports some chest tightness.  Rhythm shows sinus.  Doing well.  ROS:  All other ROS reviewed and negative. Pertinent positives noted in the HPI.     Inpatient Medications  Scheduled Meds:  sodium chloride   Intravenous Once   acetaminophen  1,000 mg Oral Q6H   Or   acetaminophen (TYLENOL) oral liquid 160 mg/5 mL  1,000 mg Per Tube Q6H   aspirin EC  325 mg Oral Daily   Or   aspirin  324 mg Per Tube Daily   bisacodyl  10 mg Oral Daily   Or   bisacodyl  10 mg Rectal Daily   Chlorhexidine Gluconate Cloth  6 each Topical Daily   Chlorhexidine Gluconate Cloth  6 each Topical Daily   docusate sodium  200 mg Oral Daily   ezetimibe  10 mg Oral Daily   [START ON 09/11/2021] furosemide  40 mg Oral Daily   nicotine  14 mg Transdermal Daily   pantoprazole  40 mg Oral Daily   potassium chloride  30 mEq Oral Once   [START ON 09/11/2021] potassium chloride  20 mEq Oral BID   sodium chloride flush  10-40 mL Intracatheter Q12H   sodium chloride flush  3 mL Intravenous Q12H   Continuous Infusions:  sodium chloride Stopped (09/09/21 1010)   sodium chloride     sodium chloride     sodium chloride 20 mL/hr at 09/08/21 1332   lactated ringers     lactated ringers Stopped (09/09/21 0508)   nitroGLYCERIN Stopped (09/08/21 1336)   phenylephrine (NEO-SYNEPHRINE) Adult infusion Stopped (09/09/21 0425)   PRN Meds: sodium chloride, sodium chloride, ALPRAZolam, diphenhydrAMINE, metoprolol tartrate, morphine injection, ondansetron (ZOFRAN) IV, oxyCODONE, sodium chloride flush, sodium chloride flush, traMADol   Vital Signs   Vitals:   09/10/21 0500 09/10/21 0600 09/10/21 0700 09/10/21 0800  BP: (!) 98/48 (!) 97/55 (!) 100/50 (!) 105/55  Pulse:  63 63 64 68  Resp: 17 13 15 19   Temp:    (!) 97.4 F (36.3 C)  TempSrc:    Oral  SpO2: 92% 90% 90% 93%  Weight: 63.3 kg     Height:        Intake/Output Summary (Last 24 hours) at 09/10/2021 0924 Last data filed at 09/10/2021 0500 Gross per 24 hour  Intake 422.34 ml  Output 1995 ml  Net -1572.66 ml   Last 3 Weights 09/10/2021 09/09/2021 09/07/2021  Weight (lbs) 139 lb 8.8 oz 139 lb 5.3 oz 126 lb 12.2 oz  Weight (kg) 63.3 kg 63.2 kg 57.5 kg      Telemetry  Overnight telemetry shows sinus rhythm 60-80 bpm, which I personally reviewed.   Physical Exam   Vitals:   09/10/21 0500 09/10/21 0600 09/10/21 0700 09/10/21 0800  BP: (!) 98/48 (!) 97/55 (!) 100/50 (!) 105/55  Pulse: 63 63 64 68  Resp: 17 13 15 19   Temp:    (!) 97.4 F (36.3 C)  TempSrc:    Oral  SpO2: 92% 90% 90% 93%  Weight: 63.3 kg     Height:        Intake/Output Summary (Last 24 hours) at 09/10/2021 0924 Last data filed at 09/10/2021 0500 Gross per 24 hour  Intake 422.34 ml  Output 1995 ml  Net -  1572.66 ml    Last 3 Weights 09/10/2021 09/09/2021 09/07/2021  Weight (lbs) 139 lb 8.8 oz 139 lb 5.3 oz 126 lb 12.2 oz  Weight (kg) 63.3 kg 63.2 kg 57.5 kg    Body mass index is 24.72 kg/m.  General: Well nourished, well developed, in no acute distress Head: Atraumatic, normal size  Eyes: PEERLA, EOMI  Neck: Supple, no JVD Endocrine: No thryomegaly Cardiac: Normal S1, S2; RRR; no murmurs, rubs, or gallops Lungs: Crackles at the right lung base Abd: Soft, nontender, no hepatomegaly  Ext: No edema, pulses 2+ Musculoskeletal: No deformities, BUE and BLE strength normal and equal Skin: Warm and dry, no rashes   Neuro: Alert and oriented to person, place, time, and situation, CNII-XII grossly intact, no focal deficits  Psych: Normal mood and affect   Labs  High Sensitivity Troponin:   Recent Labs  Lab 08/30/21 1202 08/30/21 1410 08/31/21 1004  TROPONINIHS 1,217* 1,359* 1,120*     Cardiac EnzymesNo  results for input(s): TROPONINI in the last 168 hours. No results for input(s): TROPIPOC in the last 168 hours.  Chemistry Recent Labs  Lab 09/09/21 0535 09/09/21 1619 09/10/21 0055  NA 134* 135 130*  K 3.7 3.7 3.8  CL 105 102 99  CO2 23 26 25   GLUCOSE 189* 149* 72  BUN 15 15 20   CREATININE 0.77 0.80 0.82  CALCIUM 7.2* 7.6* 7.8*  GFRNONAA >60 >60 >60  ANIONGAP 6 7 6     Hematology Recent Labs  Lab 09/09/21 0535 09/09/21 1619 09/10/21 0055  WBC 9.0 9.4 10.3  RBC 2.82* 2.69* 2.71*  HGB 9.1* 8.9* 8.7*  HCT 27.2* 26.5* 26.0*  MCV 96.5 98.5 95.9  MCH 32.3 33.1 32.1  MCHC 33.5 33.6 33.5  RDW 13.4 13.7 13.7  PLT 95* 86* 90*   BNPNo results for input(s): BNP, PROBNP in the last 168 hours.  DDimer No results for input(s): DDIMER in the last 168 hours.   Radiology  DG Chest Port 1 View  Result Date: 09/10/2021 CLINICAL DATA:  Chest pain Status post CABG EXAM: PORTABLE CHEST 1 VIEW COMPARISON:  09/09/2021 FINDINGS: Interval removal of Swan-Ganz catheter. Sheath remains in place. Mediastinal and left chest drains have been removed. Postsurgical changes of CABG again seen. Retained epicardial leads are seen. Heart size is within normal limits. No pulmonary vascular congestion. Mild left basilar atelectasis and minimal pleural effusion. IMPRESSION: Minimal left pleural effusion with mild adjacent atelectasis. Electronically Signed   By: Miachel Roux M.D.   On: 09/10/2021 08:48   DG Chest Port 1 View  Result Date: 09/09/2021 CLINICAL DATA:  Status post coronary bypass surgery, chest pain EXAM: PORTABLE CHEST 1 VIEW COMPARISON:  Previous studies including the examination of 09/08/2021 FINDINGS: Transverse diameter of heart is increased. Tip of Swan-Ganz catheter is seen in the region of main pulmonary artery. Left chest tube is noted. Surgical drains are seen in the mediastinum. Tip of Swan-Ganz catheter is seen in the course of main pulmonary artery. There is interval worsening of  infiltrates in the left lower lung fields. There is no demonstrable pneumothorax. IMPRESSION: Cardiomegaly. There is interval increase in infiltrates in the left lower lung fields suggesting worsening atelectasis/pneumonia. There are no signs of pulmonary edema. Possible small left pleural effusion. Other findings as described in the body of the report. Electronically Signed   By: Elmer Picker M.D.   On: 09/09/2021 08:26   DG Chest Port 1 View  Result Date: 09/08/2021 CLINICAL DATA:  Pneumothorax. EXAM:  PORTABLE CHEST 1 VIEW COMPARISON:  08/30/2021. FINDINGS: Endotracheal tube terminates 3.8 cm above the carina. Nasogastric tube is followed into the stomach. Right IJ Swan-Ganz catheter tip is in the proximal right pulmonary artery. Mediastinal drain and 2 left chest tubes are in place. Double to exclude a tiny left apical pneumothorax. Linear subsegmental atelectasis in the left lower lobe. Lungs are otherwise clear. No pleural fluid. IMPRESSION: 1. Difficult to exclude a tiny left apical pneumothorax. 2. Subsegmental left lower lobe atelectasis. Electronically Signed   By: Leanna Battles M.D.   On: 09/08/2021 14:00    Cardiac Studies  TTE 08/31/2021  1. Left ventricular ejection fraction, by estimation, is 70 to 75%. The  left ventricle has hyperdynamic function. The left ventricle has no  regional wall motion abnormalities. There is moderate concentric left  ventricular hypertrophy. Left ventricular  diastolic parameters are consistent with Grade I diastolic dysfunction  (impaired relaxation).   2. Right ventricular systolic function is normal. The right ventricular  size is normal. Tricuspid regurgitation signal is inadequate for assessing  PA pressure.   3. The mitral valve is normal in structure. Trivial mitral valve  regurgitation.   4. The aortic valve is tricuspid. There is mild calcification of the  aortic valve. There is mild thickening of the aortic valve. Aortic valve   regurgitation is trivial.   5. Aortic dilatation noted. 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   6. The inferior vena cava is normal in size with greater than 50%  respiratory variability, suggesting right atrial pressure of 3 mmHg.  Patient Profile  Alyissa Whidbee is a 75 y.o. female with autoimmune hepatitis, hypertension, former tobacco abuse who was admitted on 08/30/2021 with non-STEMI.  Assessment & Plan   #Non-STEMI #Three-vessel CAD status post CABG -Admitted with non-STEMI.  Found to have three-vessel CAD. -Status post CABG x4 on 09/08/2021.  LIMA was determined to be poor candidate for revascularization at time of surgery.  She underwent SVG to LAD, SVG to RCA, SVG to OM and OM 2. -Recovering well in surgery. -Continue aspirin 81 mg daily.  She is on Zetia 10 mg daily.  Will be considered for PCSK9 inhibitor as an outpatient. -Add back guideline directed medical therapy as you are able. -Would recommend to add Plavix 75 mg daily once you are able as she had non-STEMI.  #Carotid artery disease -Right ICA 40-59%, left ICA 1-39%. -Follow-up as outpatient.  #Hypertension -Add back meds if needed  #Autoimmune hepatitis -Azathioprine held for surgery.  Add back at the discretion of surgery.  #Hyperlipidemia -No statin due to autoimmune hepatitis.  Zetia 10 mg daily.  Outpatient PCSK9 inhibitor therapy evaluation.  Cardiology will follow remotely.  Please notify us when she is closer to discharge so we may arrange hospital follow-up.  If you have questions or concerns please let us know.  For questions or updates, please contact [facility] Please consult [email or web address] for contact info under   Time Spent with Patient: I have spent a total of 25 minutes with patient reviewing hospital notes, telemetry, EKGs, labs and examining the patient as well as establishing an assessment and plan that was discussed with the patient.  > 50% of time was  spent in direct patient care.    Signed, Lenna Gilford. Flora Lipps, MD, [facility]   [facility]  09/10/2021 9:24 AM

## 2021-09-10 NOTE — Progress Notes (Addendum)
TCTS DAILY ICU PROGRESS NOTE                   [address]            [place] [address]          [phone]   2 Days Post-Op Procedure(s) (LRB): CORONARY ARTERY BYPASS GRAFTING (CABG) X 4  USING LEFT INTERNAL MAMMARY ARTERY AND RIGHT ENDOSCOPIC GREATER SAPHENOUS VEIN CONDUITS. (N/A) TRANSESOPHAGEAL ECHOCARDIOGRAM (TEE) (N/A) APPLICATION OF CELL SAVER ENDOVEIN HARVEST OF GREATER SAPHENOUS VEIN (Right)  Total Length of Stay:  LOS: 11 days   Subjective: Patient eating a little breakfast this am. She did not sleep much last night.  Objective: Vital signs in last 24 hours: Temp:  [97.7 F (36.5 C)-98.8 F (37.1 C)] 97.7 F (36.5 C) (12/13 2008) Pulse Rate:  [61-72] 63 (12/14 0500) Cardiac Rhythm: Normal sinus rhythm (12/13 2000) Resp:  [12-23] 17 (12/14 0500) BP: (90-144)/(48-112) 98/48 (12/14 0500) SpO2:  [86 %-98 %] 86 % (12/14 0500) Arterial Line BP: (115-141)/(55-73) 141/73 (12/13 1000) Weight:  [63.3 kg] 63.3 kg (12/14 0500)  Filed Weights   09/07/21 0416 09/09/21 0500 09/10/21 0500  Weight: 57.5 kg 63.2 kg 63.3 kg    Weight change: 0.1 kg   Hemodynamic parameters for last 24 hours: PAP: (27-35)/(11-19) 35/19  Intake/Output from previous day: 12/13 0701 - 12/14 0700 In: 422.3 [I.V.:122.3; IV Piggyback:300] Out: 2080 [Urine:2080]  Intake/Output this shift: No intake/output data recorded.  Current Meds: Scheduled Meds:  sodium chloride   Intravenous Once   acetaminophen  1,000 mg Oral Q6H   Or   acetaminophen (TYLENOL) oral liquid 160 mg/5 mL  1,000 mg Per Tube Q6H   aspirin EC  325 mg Oral Daily   Or   aspirin  324 mg Per Tube Daily   bisacodyl  10 mg Oral Daily   Or   bisacodyl  10 mg Rectal Daily   Chlorhexidine Gluconate Cloth  6 each Topical Daily   Chlorhexidine Gluconate Cloth  6 each Topical Daily   docusate sodium  200 mg Oral Daily   ezetimibe  10 mg Oral Daily   insulin aspart  3-9 Units Subcutaneous Q4H   insulin  detemir  5 Units Subcutaneous Q12H   nicotine  14 mg Transdermal Daily   pantoprazole  40 mg Oral Daily   sodium chloride flush  10-40 mL Intracatheter Q12H   sodium chloride flush  3 mL Intravenous Q12H   Continuous Infusions:  sodium chloride Stopped (09/09/21 1010)   sodium chloride     sodium chloride     sodium chloride 20 mL/hr at 09/08/21 1332   lactated ringers     lactated ringers Stopped (09/09/21 0508)   nitroGLYCERIN Stopped (09/08/21 1336)   phenylephrine (NEO-SYNEPHRINE) Adult infusion Stopped (09/09/21 0425)   PRN Meds:.sodium chloride, sodium chloride, ALPRAZolam, diphenhydrAMINE, metoprolol tartrate, morphine injection, ondansetron (ZOFRAN) IV, oxyCODONE, sodium chloride flush, sodium chloride flush, traMADol  General appearance: alert, cooperative, and no distress Heart: RRR Lungs: Slightly diminished bibasilar breath sounds L>R Abdomen: Soft, non tender, bowel sounds Extremities: Mild LE edema Wound: Sternal dressing is dry and intact. RLE wounds are clean and dry. Ecchymosis right thigh  Lab Results: CBC: Recent Labs    09/09/21 1619 09/10/21 0055  WBC 9.4 10.3  HGB 8.9* 8.7*  HCT 26.5* 26.0*  PLT 86* 90*   BMET:  Recent Labs    09/09/21 1619 09/10/21 0055  NA 135 130*  K 3.7  3.8  CL 102 99  CO2 26 25  GLUCOSE 149* 72  BUN 15 20  CREATININE 0.80 0.82  CALCIUM 7.6* 7.8*    CMET: Lab Results  Component Value Date   WBC 10.3 09/10/2021   HGB 8.7 (L) 09/10/2021   HCT 26.0 (L) 09/10/2021   PLT 90 (L) 09/10/2021   GLUCOSE 72 09/10/2021   CHOL 205 (H) 08/31/2021   TRIG 54 08/31/2021   HDL 49 08/31/2021   LDLCALC 145 (H) 08/31/2021   ALT 15 09/02/2021   AST 25 09/02/2021   NA 130 (L) 09/10/2021   K 3.8 09/10/2021   CL 99 09/10/2021   CREATININE 0.82 09/10/2021   BUN 20 09/10/2021   CO2 25 09/10/2021   TSH 4.625 (H) 08/31/2021   INR 1.2 09/08/2021   HGBA1C 5.5 08/31/2021      PT/INR:  Recent Labs    09/08/21 1318  LABPROT  15.5*  INR 1.2   Radiology: No results found.   Assessment/Plan: S/P Procedure(s) (LRB): CORONARY ARTERY BYPASS GRAFTING (CABG) X 4  USING LEFT INTERNAL MAMMARY ARTERY AND RIGHT ENDOSCOPIC GREATER SAPHENOUS VEIN CONDUITS. (N/A) TRANSESOPHAGEAL ECHOCARDIOGRAM (TEE) (N/A) APPLICATION OF CELL SAVER ENDOVEIN HARVEST OF GREATER SAPHENOUS VEIN (Right) CV-SR with SBP in the 90's. No BB yet. Pulmonary-on room air this am. CXR this am.appears stable (cardiomegaly, left base atelectasis,effusion). Encourage incentive spirometer Volume overload-with labile BP, will not diurese  Expected post op blood loss anemia-H and H this am stable at 8.7 and 26. CBGs 175/81/74 . Pre op HGA1C 5.5. Stop accu checks and SS PRN upon transfer. 6. Thrombocytopenia-platelets this am slightly increased to 90,000 7. Supplement potassium 8. Remove foley 9. Will discuss with Dr. Laneta Simmers if ok to transfer to 4E  Donielle Margaretann Loveless PA-C 09/10/2021 7:27 AM    Chart reviewed, patient examined, agree with above. She looks good overall.  -1600 cc yesterday but wt did not change. Since BP 90's will hold off on further diuresis until tomorrow. Transfer to 4E.

## 2021-09-10 NOTE — Progress Notes (Signed)
CARDIAC REHAB PHASE I   PRE:  Rate/Rhythm: 67 SR    BP: sitting 100/74    SaO2: 95 RA  MODE:  Ambulation: 470 ft   POST:  Rate/Rhythm: 82 SR    BP: sitting 125/74     SaO2: 99 RA  Pt needed verbal cues and min assist for moving to EOB but independent with standing.  Walked with RW, struggled with steering and needed assist at times. No c/o, VSS. To recliner on chair alarm.  [id number]  Harriet Masson CES, ACSM 09/10/2021 3:13 PM

## 2021-09-11 ENCOUNTER — Inpatient Hospital Stay (HOSPITAL_COMMUNITY): Payer: Medicare HMO

## 2021-09-11 LAB — CBC
HCT: 24.5 % — ABNORMAL LOW (ref 36.0–46.0)
Hemoglobin: 8.4 g/dL — ABNORMAL LOW (ref 12.0–15.0)
MCH: 33.3 pg (ref 26.0–34.0)
MCHC: 34.3 g/dL (ref 30.0–36.0)
MCV: 97.2 fL (ref 80.0–100.0)
Platelets: 88 10*3/uL — ABNORMAL LOW (ref 150–400)
RBC: 2.52 MIL/uL — ABNORMAL LOW (ref 3.87–5.11)
RDW: 13.9 % (ref 11.5–15.5)
WBC: 5.8 10*3/uL (ref 4.0–10.5)
nRBC: 0 % (ref 0.0–0.2)

## 2021-09-11 LAB — GLUCOSE, CAPILLARY
Glucose-Capillary: 28 mg/dL — CL (ref 70–99)
Glucose-Capillary: 31 mg/dL — CL (ref 70–99)

## 2021-09-11 LAB — BASIC METABOLIC PANEL
Anion gap: 4 — ABNORMAL LOW (ref 5–15)
BUN: 16 mg/dL (ref 8–23)
CO2: 26 mmol/L (ref 22–32)
Calcium: 7.9 mg/dL — ABNORMAL LOW (ref 8.9–10.3)
Chloride: 104 mmol/L (ref 98–111)
Creatinine, Ser: 0.94 mg/dL (ref 0.44–1.00)
GFR, Estimated: >60 mL/min (ref >60)
Glucose, Bld: 97 mg/dL (ref 70–99)
Potassium: 4.5 mmol/L (ref 3.5–5.1)
Sodium: 134 mmol/L — ABNORMAL LOW (ref 135–145)

## 2021-09-11 MED ORDER — ENSURE ENLIVE PO LIQD
237.0000 mL | Freq: Three times a day (TID) | ORAL | Status: DC
  Administered 2021-09-11 – 2021-09-13 (×5): 237 mL via ORAL

## 2021-09-11 MED ORDER — FERROUS SULFATE 325 (65 FE) MG PO TABS
325.0000 mg | ORAL_TABLET | Freq: Every day | ORAL | Status: DC
  Administered 2021-09-11 – 2021-09-13 (×3): 325 mg via ORAL
  Filled 2021-09-11 (×3): qty 1

## 2021-09-11 MED ORDER — LACTULOSE 10 GM/15ML PO SOLN: 30.0000 g | Freq: Every day | ORAL | Status: DC | PRN

## 2021-09-11 NOTE — Progress Notes (Signed)
CARDIAC REHAB PHASE I   PRE:  Rate/Rhythm: 71 SR    BP: sitting 110/89    SaO2: 98 RA  MODE:  Ambulation: 470 ft   POST:  Rate/Rhythm: 99 SR    BP: sitting 131/65     SaO2: 89 RA, 98 RA with rest  Pt moving fairly well. Keeps steps close together with narrow stance, esp with left leg, however she was better without RW than with. Used gait belt for contact guard. C/o SOB and minor chest pain. Return to bed for EPW pull.  [id number]   [name] [name] CES, ACSM 09/11/2021 10:28 AM

## 2021-09-11 NOTE — Progress Notes (Addendum)
Mobility Specialist: Progress Note   09/11/21 1230  Mobility  Activity Ambulated in hall  Level of Assistance Minimal assist, patient does 75% or more  Assistive Device None  Distance Ambulated (ft) 470 ft  Mobility Ambulated with assistance in hallway  Mobility Response Tolerated well  Mobility performed by Mobility specialist  Bed Position Chair  $Mobility charge 1 Mobility   Post-Mobility: 86 HR  Pt required minA to sit EOB and was contact guard throughout. Pt stopped x3 for brief standing breaks secondary to SOB and some chest pain, no rating given. Pt a little unsteady during ambulation requiring contact guard for balance. Pt to recliner after walk and is set up with her lunch.   [facility] Kayde Warehime Mobility Specialist Mobility Specialist [address] [place]: [phone] Mobility Specialist [address] [place] and [address] [place]: [phone]

## 2021-09-11 NOTE — Progress Notes (Signed)
Removed epicardial pacing wires per order. Ends intact. Patient tolerated well. Educated pt on bedrest for one hour. VSS. Will continue to monitor.  Kenard Gower, RN

## 2021-09-11 NOTE — Progress Notes (Addendum)
° °   °  [address]       [place] [address]             [phone]        3 Days Post-Op Procedure(s) (LRB): CORONARY ARTERY BYPASS GRAFTING (CABG) X 4  USING LEFT INTERNAL MAMMARY ARTERY AND RIGHT ENDOSCOPIC GREATER SAPHENOUS VEIN CONDUITS. (N/A) TRANSESOPHAGEAL ECHOCARDIOGRAM (TEE) (N/A) APPLICATION OF CELL SAVER ENDOVEIN HARVEST OF GREATER SAPHENOUS VEIN (Right)  Subjective: Patient not eating much. She denies nausea or abdominal pain;just no appetite  Objective: Vital signs in last 24 hours: Temp:  [97.3 F (36.3 C)-99 F (37.2 C)] 99 F (37.2 C) (12/15 0326) Pulse Rate:  [67-79] 68 (12/15 0326) Cardiac Rhythm: Normal sinus rhythm (12/15 0330) Resp:  [15-20] 17 (12/15 0326) BP: (99-125)/(54-74) 107/54 (12/15 0326) SpO2:  [92 %-100 %] 100 % (12/15 0326) Weight:  [62.6 kg] 62.6 kg (12/15 0326)  Pre op weight  57.5 kg Current Weight  09/11/21 62.6 kg       Intake/Output from previous day: 12/14 0701 - 12/15 0700 In: 340 [P.O.:340] Out: 1650 [Urine:1650]   Physical Exam:  Cardiovascular: RRR Pulmonary: Slightly diminished left basilar breath sounds Abdomen: Soft, non tender, bowel sounds present. Extremities: Mild bilateral lower extremity edema. Wounds: Sternal dressing removed and wound is clean and dry.  No erythema or signs of infection.  Lab Results: CBC: Recent Labs    09/10/21 0055 09/11/21 0403  WBC 10.3 5.8  HGB 8.7* 8.4*  HCT 26.0* 24.5*  PLT 90* 88*   BMET:  Recent Labs    09/10/21 0055 09/11/21 0403  NA 130* 134*  K 3.8 4.5  CL 99 104  CO2 25 26  GLUCOSE 72 97  BUN 20 16  CREATININE 0.82 0.94  CALCIUM 7.8* 7.9*    PT/INR:  Lab Results  Component Value Date   INR 1.2 09/08/2021   INR 0.9 08/30/2021   INR 0.92 06/02/2018   ABG:  INR: Will add last result for INR, ABG once components are confirmed Will add last 4 CBG results once components are confirmed  Assessment/Plan: CV-SR, PVCs with SBP low  100's. Will likely start BB once BP improved Pulmonary-on room air this am. CXR this am.appears stable (cardiomegaly, left base atelectasis/small effusion). Encourage incentive spirometer Volume overload-on Lasix 40 mg daily Expected post op blood loss anemia-H and H this am decreased to 8.4 and 24.5. Start oral Ferrous 5. Thrombocytopenia-platelets this am slightly decreased to 88,000 6. Mild hyponatremia-sodium up to 134 this am 7. Remove EPW 8. Ensure tid;LOC constipation 9. 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 She is feeling ok overall. Wt is still 11 lbs over preop if accurate. She has a small left pleural effusion on CXR. Continue diuresis and KCL.

## 2021-09-11 NOTE — Progress Notes (Signed)
Mobility Specialist: Progress Note   09/11/21 1658  Mobility  Activity Ambulated to bathroom  Level of Assistance Minimal assist, patient does 75% or more  Assistive Device None  Distance Ambulated (ft) 30 ft  Mobility Out of bed for toileting  Mobility Response Tolerated well  Mobility performed by Mobility specialist  $Mobility charge 1 Mobility   Pt assisted OOB to go to the BR. Once finished pt educated on bed mobility and demonstrated with pt's sister present in the room, RN present.   [facility] Merric Yost Mobility Specialist Mobility Specialist [address] [place]: [phone] Mobility Specialist 2 [place] and [address] [place]: [phone]

## 2021-09-11 NOTE — Care Management Important Message (Signed)
Important Message  Patient Details  Name: [name] [name] MRN: [id number] Date of Birth: 1945/11/16   Medicare Important Message Given:  Yes     Renie Ora 09/11/2021, 9:31 AM

## 2021-09-12 MED ORDER — DIPHENHYDRAMINE HCL 25 MG PO CAPS
25.0000 mg | ORAL_CAPSULE | Freq: Once | ORAL | Status: AC
  Administered 2021-09-12: 25 mg via ORAL
  Filled 2021-09-12: qty 1

## 2021-09-12 MED ORDER — DIPHENHYDRAMINE HCL 25 MG PO CAPS: 25.0000 mg | ORAL_CAPSULE | Freq: Every evening | ORAL | Status: DC | PRN

## 2021-09-12 MED ORDER — ACETAMINOPHEN 325 MG PO TABS: 650.0000 mg | ORAL_TABLET | Freq: Four times a day (QID) | ORAL | Status: DC | PRN

## 2021-09-12 MED ORDER — ASPIRIN 325 MG PO TBEC: 325.0000 mg | DELAYED_RELEASE_TABLET | Freq: Every day | ORAL | 0 refills | Status: DC

## 2021-09-12 NOTE — Progress Notes (Signed)
Discussed with pt and sister IS, sternal precautions, exercise, smoking cessation, diet, and CRPII. Receptive, appropriate questions. Will refer to [facility] CRPII.  [phone] Ethelda Chick CES, ACSM 2:01 PM 09/12/2021

## 2021-09-12 NOTE — Progress Notes (Signed)
° °   °  [address]       [place] [address]             [phone]        4 Days Post-Op Procedure(s) (LRB): CORONARY ARTERY BYPASS GRAFTING (CABG) X 4  USING LEFT INTERNAL MAMMARY ARTERY AND RIGHT ENDOSCOPIC GREATER SAPHENOUS VEIN CONDUITS. (N/A) TRANSESOPHAGEAL ECHOCARDIOGRAM (TEE) (N/A) APPLICATION OF CELL SAVER ENDOVEIN HARVEST OF GREATER SAPHENOUS VEIN (Right)  Subjective: Patient napping in chair this am. She was awakened. She states she had a bad night. She was hot and cold, urinated in bed, and was emotional. She has some incisional,sternal pain  Objective: Vital signs in last 24 hours: Temp:  [97.9 F (36.6 C)-99 F (37.2 C)] 97.9 F (36.6 C) (12/16 0400) Pulse Rate:  [70-80] 71 (12/16 0400) Cardiac Rhythm: Normal sinus rhythm (12/15 1900) Resp:  [14-24] 24 (12/16 0437) BP: (97-137)/(53-94) 137/94 (12/16 0400) SpO2:  [92 %-100 %] 100 % (12/16 0400) Weight:  [61.9 kg] 61.9 kg (12/16 0437)  Pre op weight  57.5 kg Current Weight  09/12/21 61.9 kg       Intake/Output from previous day: 12/15 0701 - 12/16 0700 In: 1174 [P.O.:1174] Out: 600 [Urine:600]   Physical Exam:  Cardiovascular: RRR Pulmonary: Slightly diminished left basilar breath sounds Abdomen: Soft, non tender, bowel sounds present. Extremities: Mild bilateral lower extremity edema. Wounds: Sternal wounds and RLE wounds are clean and dry.  No erythema or signs of infection.  Lab Results: CBC: Recent Labs    09/10/21 0055 09/11/21 0403  WBC 10.3 5.8  HGB 8.7* 8.4*  HCT 26.0* 24.5*  PLT 90* 88*    BMET:  Recent Labs    09/10/21 0055 09/11/21 0403  NA 130* 134*  K 3.8 4.5  CL 99 104  CO2 25 26  GLUCOSE 72 97  BUN 20 16  CREATININE 0.82 0.94  CALCIUM 7.8* 7.9*     PT/INR:  Lab Results  Component Value Date   INR 1.2 09/08/2021   INR 0.9 08/30/2021   INR 0.92 06/02/2018   ABG:  INR: Will add last result for INR, ABG once components are confirmed Will  add last 4 CBG results once components are confirmed  Assessment/Plan: CV-SR this am.  Pulmonary-on room air. Encourage incentive spirometer Volume overload-on Lasix 40 mg daily Expected post op blood loss anemia-H and H this am decreased to 8.4 and 24.5. Start oral Ferrous 5. Thrombocytopenia-platelets this am slightly decreased to 88,000 6. 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 Patient will discuss with doctor if has to resume post op 7. Will discharge in am Derisha Funderburke M ZimmermanPA-C 09/12/2021,7:01 AM

## 2021-09-12 NOTE — Progress Notes (Signed)
Mobility Specialist: Progress Note   09/12/21 1404  Mobility  Activity Ambulated in hall  Level of Assistance Contact guard assist, steadying assist  Assistive Device None  Distance Ambulated (ft) 620 ft  Mobility Ambulated with assistance in hallway  Mobility Response Tolerated well  Mobility performed by Mobility specialist  Bed Position Chair  $Mobility charge 1 Mobility   Pt c/o chest pain during ambulation, otherwise no c/o. Pt to recliner after walk with call bell and phone in reach.   [facility] Mobility Specialist Mobility Specialist 4 [place]: [phone] Mobility Specialist [address] [place] and [address] [place]: [phone]

## 2021-09-12 NOTE — Progress Notes (Signed)
CARDIAC REHAB PHASE I   PRE:  Rate/Rhythm: 75 SR    BP: sitting 105/34    SaO2: 95 RA  MODE:  Ambulation: 470 ft   POST:  Rate/Rhythm: 96 SR    BP: sitting 131/66     SaO2: wouldn't register  To BR then ambulated with standby assist. No major c/o. Return to recliner. Will do ed with sister at 1. [phone]   Harriet Masson CES, ACSM 09/12/2021 10:51 AM

## 2021-09-13 DIAGNOSIS — I214 Non-ST elevation (NSTEMI) myocardial infarction: Secondary | ICD-10-CM | POA: Diagnosis not present

## 2021-09-13 MED ORDER — TRAMADOL HCL 50 MG PO TABS: 50.0000 mg | ORAL_TABLET | ORAL | 0 refills | Status: DC | PRN

## 2021-09-13 MED ORDER — EZETIMIBE 10 MG PO TABS: 10.0000 mg | ORAL_TABLET | Freq: Every day | ORAL | 3 refills | Status: AC

## 2021-09-13 MED ORDER — SERTRALINE HCL 50 MG PO TABS: 50.0000 mg | ORAL_TABLET | Freq: Every day | ORAL | 2 refills | Status: DC

## 2021-09-13 MED ORDER — METOPROLOL SUCCINATE ER 25 MG PO TB24: 25.0000 mg | ORAL_TABLET | Freq: Every day | ORAL | 3 refills | Status: DC

## 2021-09-13 MED ORDER — METOPROLOL SUCCINATE ER 25 MG PO TB24
25.0000 mg | ORAL_TABLET | Freq: Every day | ORAL | Status: DC
  Administered 2021-09-13: 25 mg via ORAL
  Filled 2021-09-13: qty 1

## 2021-09-13 NOTE — Progress Notes (Signed)
CARDIAC REHAB PHASE I   Reinforced d/c education with pt. Pt states she has depressed this morning, but is feeling better now. Reinforced importance of sternal precautions, walks, and IS use. Pt currently demonstrating 625 on IS. Hopeful for d/c with sister today. Referred to CRP II  [place].  [phone] Reynold Bowen, RN BSN 09/13/2021 8:26 AM

## 2021-09-13 NOTE — Progress Notes (Signed)
Order received to discharge patient.  Telemetry monitor removed and CCMD notified.  PIV access removed.  Discharge instructions, follow up, medications and instructions for their use discussed with patient and patient's sister.

## 2021-09-13 NOTE — Progress Notes (Signed)
[facility] will sign off.   Medication Recommendations: Aspirin 3 and 25 mg daily for 3 months, then 81 mg daily; ezetimibe 10 mg daily, metoprolol succinate 25 mg daily. Plan to start PCSK9 as outpatient. Other recommendations (labs, testing, etc):  n/a Follow up as an outpatient:  Oct 07, 2021 - scheduled w Gillian Shields, NP at [facility] office

## 2021-09-13 NOTE — Progress Notes (Addendum)
° °   °  [address]       [place] [address]             [phone]       5 Days Post-Op Procedure(s) (LRB): CORONARY ARTERY BYPASS GRAFTING (CABG) X 4  USING LEFT INTERNAL MAMMARY ARTERY AND RIGHT ENDOSCOPIC GREATER SAPHENOUS VEIN CONDUITS. (N/A) TRANSESOPHAGEAL ECHOCARDIOGRAM (TEE) (N/A) APPLICATION OF CELL SAVER ENDOVEIN HARVEST OF GREATER SAPHENOUS VEIN (Right)  Subjective:  Patient is tearful stating she is depressed.  She has pain at times.  She is happy to be going home today.  Objective: Vital signs in last 24 hours: Temp:  [97.5 F (36.4 C)-99.4 F (37.4 C)] 97.5 F (36.4 C) (12/17 0815) Pulse Rate:  [77-90] 90 (12/17 0815) Cardiac Rhythm: Ventricular tachycardia (12/17 0753) Resp:  [17-20] 19 (12/17 0815) BP: (107-145)/(64-89) 145/89 (12/17 0815) SpO2:  [91 %-100 %] 100 % (12/17 0815) Weight:  [62 kg] 62 kg (12/17 0326)  Intake/Output from previous day: 12/16 0701 - 12/17 0700 In: 460 [P.O.:460] Out: 1600 [Urine:1600] Intake/Output this shift: Total I/O In: -  Out: 400 [Urine:400]  General appearance: alert, cooperative, and no distress Heart: regular rate and rhythm Lungs: clear to auscultation bilaterally Abdomen: soft, non-tender; bowel sounds normal; no masses,  no organomegaly Extremities: edema trace Wound: clean and dry  Lab Results: Recent Labs    09/11/21 0403  WBC 5.8  HGB 8.4*  HCT 24.5*  PLT 88*   BMET:  Recent Labs    09/11/21 0403  NA 134*  K 4.5  CL 104  CO2 26  GLUCOSE 97  BUN 16  CREATININE 0.94  CALCIUM 7.9*    PT/INR: No results for input(s): LABPROT, INR in the last 72 hours. ABG    Component Value Date/Time   PHART 7.379 09/08/2021 1831   HCO3 25.4 09/08/2021 1831   TCO2 27 09/08/2021 1831   ACIDBASEDEF 1.0 09/08/2021 1401   O2SAT 98.0 09/08/2021 1831   CBG (last 3)  Recent Labs    09/10/21 1122  GLUCAP 119*    Assessment/Plan: S/P Procedure(s) (LRB): CORONARY ARTERY BYPASS  GRAFTING (CABG) X 4  USING LEFT INTERNAL MAMMARY ARTERY AND RIGHT ENDOSCOPIC GREATER SAPHENOUS VEIN CONDUITS. (N/A) TRANSESOPHAGEAL ECHOCARDIOGRAM (TEE) (N/A) APPLICATION OF CELL SAVER ENDOVEIN HARVEST OF GREATER SAPHENOUS VEIN (Right)  CV- NSR, + HTN- will start Toprol XL Pulm- no acute issues, continue IS Renal- creatinine stable, weight is trending down, last dose of lasix scheduled for today Psych- patient crying and upset, states she feels so down.. was requesting xanax at discharge to manage, I think she would be better served with anti-depressant, I spoke with pharmacy and we will send her home on xanax H/O Autoimmune hepatitis- she will need to follow up with her physician at [facility] to determine in her Azathioprine needs resumed. Dispo- patient stable, will d/c home today   LOS: 14 days    Lowella Dandy, PA-C 09/13/2021   Chart reviewed, patient examined, agree with above. She looks good overall. Labile emotionally but I think that will improve with time. She seems fine right this minute. Plan home today with her sister.

## 2021-09-18 DIAGNOSIS — I2510 Atherosclerotic heart disease of native coronary artery without angina pectoris: Secondary | ICD-10-CM | POA: Diagnosis not present

## 2021-09-18 DIAGNOSIS — K754 Autoimmune hepatitis: Secondary | ICD-10-CM | POA: Diagnosis not present

## 2021-09-18 DIAGNOSIS — E7800 Pure hypercholesterolemia, unspecified: Secondary | ICD-10-CM | POA: Diagnosis not present

## 2021-09-18 DIAGNOSIS — Z6823 Body mass index (BMI) 23.0-23.9, adult: Secondary | ICD-10-CM | POA: Diagnosis not present

## 2021-09-18 DIAGNOSIS — R69 Illness, unspecified: Secondary | ICD-10-CM | POA: Diagnosis not present

## 2021-09-18 DIAGNOSIS — I10 Essential (primary) hypertension: Secondary | ICD-10-CM | POA: Diagnosis not present

## 2021-09-18 DIAGNOSIS — I214 Non-ST elevation (NSTEMI) myocardial infarction: Secondary | ICD-10-CM | POA: Diagnosis not present

## 2021-09-19 ENCOUNTER — Observation Stay (HOSPITAL_COMMUNITY)
Admission: EM | Admit: 2021-09-19 | Discharge: 2021-09-20 | Disposition: A | Discharge status: Home / Self Care | Payer: Medicare HMO | Attending: Internal Medicine | Admitting: Internal Medicine

## 2021-09-19 ENCOUNTER — Other Ambulatory Visit: Payer: Self-pay

## 2021-09-19 ENCOUNTER — Telehealth: Payer: Self-pay | Admitting: *Deleted

## 2021-09-19 ENCOUNTER — Emergency Department (HOSPITAL_COMMUNITY): Payer: Medicare HMO

## 2021-09-19 DIAGNOSIS — R531 Weakness: Secondary | ICD-10-CM | POA: Diagnosis not present

## 2021-09-19 DIAGNOSIS — J811 Chronic pulmonary edema: Secondary | ICD-10-CM | POA: Diagnosis not present

## 2021-09-19 DIAGNOSIS — I10 Essential (primary) hypertension: Secondary | ICD-10-CM | POA: Diagnosis not present

## 2021-09-19 DIAGNOSIS — R0902 Hypoxemia: Secondary | ICD-10-CM | POA: Diagnosis not present

## 2021-09-19 DIAGNOSIS — Z79899 Other long term (current) drug therapy: Secondary | ICD-10-CM | POA: Diagnosis not present

## 2021-09-19 DIAGNOSIS — I2510 Atherosclerotic heart disease of native coronary artery without angina pectoris: Secondary | ICD-10-CM | POA: Diagnosis not present

## 2021-09-19 DIAGNOSIS — R110 Nausea: Secondary | ICD-10-CM | POA: Diagnosis not present

## 2021-09-19 DIAGNOSIS — R69 Illness, unspecified: Secondary | ICD-10-CM | POA: Diagnosis not present

## 2021-09-19 DIAGNOSIS — R5383 Other fatigue: Secondary | ICD-10-CM | POA: Diagnosis present

## 2021-09-19 DIAGNOSIS — F419 Anxiety disorder, unspecified: Secondary | ICD-10-CM | POA: Diagnosis present

## 2021-09-19 DIAGNOSIS — R1011 Right upper quadrant pain: Secondary | ICD-10-CM | POA: Insufficient documentation

## 2021-09-19 DIAGNOSIS — F17210 Nicotine dependence, cigarettes, uncomplicated: Secondary | ICD-10-CM | POA: Diagnosis not present

## 2021-09-19 DIAGNOSIS — Z20822 Contact with and (suspected) exposure to covid-19: Secondary | ICD-10-CM | POA: Diagnosis not present

## 2021-09-19 DIAGNOSIS — J90 Pleural effusion, not elsewhere classified: Secondary | ICD-10-CM | POA: Diagnosis not present

## 2021-09-19 DIAGNOSIS — I517 Cardiomegaly: Secondary | ICD-10-CM | POA: Diagnosis not present

## 2021-09-19 DIAGNOSIS — Z951 Presence of aortocoronary bypass graft: Secondary | ICD-10-CM | POA: Diagnosis not present

## 2021-09-19 DIAGNOSIS — R778 Other specified abnormalities of plasma proteins: Secondary | ICD-10-CM | POA: Diagnosis not present

## 2021-09-19 DIAGNOSIS — Z7982 Long term (current) use of aspirin: Secondary | ICD-10-CM | POA: Diagnosis not present

## 2021-09-19 DIAGNOSIS — E785 Hyperlipidemia, unspecified: Secondary | ICD-10-CM | POA: Diagnosis present

## 2021-09-19 DIAGNOSIS — D649 Anemia, unspecified: Secondary | ICD-10-CM | POA: Diagnosis not present

## 2021-09-19 DIAGNOSIS — R7989 Other specified abnormal findings of blood chemistry: Secondary | ICD-10-CM | POA: Diagnosis present

## 2021-09-19 DIAGNOSIS — Z743 Need for continuous supervision: Secondary | ICD-10-CM | POA: Diagnosis not present

## 2021-09-19 DIAGNOSIS — R112 Nausea with vomiting, unspecified: Secondary | ICD-10-CM | POA: Diagnosis not present

## 2021-09-19 DIAGNOSIS — R000 Tachycardia, unspecified: Secondary | ICD-10-CM | POA: Diagnosis not present

## 2021-09-19 LAB — URINALYSIS, ROUTINE W REFLEX MICROSCOPIC
Bilirubin Urine: NEGATIVE
Glucose, UA: NEGATIVE mg/dL
Ketones, ur: NEGATIVE mg/dL
Leukocytes,Ua: NEGATIVE
Nitrite: NEGATIVE
Protein, ur: NEGATIVE mg/dL
Specific Gravity, Urine: <1.005 — ABNORMAL LOW (ref 1.005–1.030)
pH: 6 (ref 5.0–8.0)

## 2021-09-19 LAB — RESP PANEL BY RT-PCR (FLU A&B, COVID) ARPGX2
Influenza A by PCR: NEGATIVE
Influenza B by PCR: NEGATIVE
SARS Coronavirus 2 by RT PCR: NEGATIVE

## 2021-09-19 LAB — CBC WITH DIFFERENTIAL/PLATELET
Abs Immature Granulocytes: 0.06 10*3/uL (ref 0.00–0.07)
Basophils Absolute: 0 10*3/uL (ref 0.0–0.1)
Basophils Relative: 0 %
Eosinophils Absolute: 0 10*3/uL (ref 0.0–0.5)
Eosinophils Relative: 0 %
HCT: 27.7 % — ABNORMAL LOW (ref 36.0–46.0)
Hemoglobin: 9.3 g/dL — ABNORMAL LOW (ref 12.0–15.0)
Immature Granulocytes: 1 %
Lymphocytes Relative: 11 %
Lymphs Abs: 0.9 10*3/uL (ref 0.7–4.0)
MCH: 32.5 pg (ref 26.0–34.0)
MCHC: 33.6 g/dL (ref 30.0–36.0)
MCV: 96.9 fL (ref 80.0–100.0)
Monocytes Absolute: 0.5 10*3/uL (ref 0.1–1.0)
Monocytes Relative: 5 %
Neutro Abs: 7.1 10*3/uL (ref 1.7–7.7)
Neutrophils Relative %: 83 %
Platelets: 296 10*3/uL (ref 150–400)
RBC: 2.86 MIL/uL — ABNORMAL LOW (ref 3.87–5.11)
RDW: 13.6 % (ref 11.5–15.5)
WBC: 8.5 10*3/uL (ref 4.0–10.5)
nRBC: 0 % (ref 0.0–0.2)

## 2021-09-19 LAB — COMPREHENSIVE METABOLIC PANEL
ALT: 12 U/L (ref 0–44)
AST: 22 U/L (ref 15–41)
Albumin: 3.2 g/dL — ABNORMAL LOW (ref 3.5–5.0)
Alkaline Phosphatase: 51 U/L (ref 38–126)
Anion gap: 11 (ref 5–15)
BUN: 13 mg/dL (ref 8–23)
CO2: 19 mmol/L — ABNORMAL LOW (ref 22–32)
Calcium: 8.8 mg/dL — ABNORMAL LOW (ref 8.9–10.3)
Chloride: 101 mmol/L (ref 98–111)
Creatinine, Ser: 0.85 mg/dL (ref 0.44–1.00)
GFR, Estimated: >60 mL/min (ref >60)
Glucose, Bld: 123 mg/dL — ABNORMAL HIGH (ref 70–99)
Potassium: 3.6 mmol/L (ref 3.5–5.1)
Sodium: 131 mmol/L — ABNORMAL LOW (ref 135–145)
Total Bilirubin: 0.6 mg/dL (ref 0.3–1.2)
Total Protein: 6.9 g/dL (ref 6.5–8.1)

## 2021-09-19 LAB — I-STAT CHEM 8, ED
BUN: 13 mg/dL (ref 8–23)
Calcium, Ion: 1.18 mmol/L (ref 1.15–1.40)
Chloride: 102 mmol/L (ref 98–111)
Creatinine, Ser: 0.7 mg/dL (ref 0.44–1.00)
Glucose, Bld: 117 mg/dL — ABNORMAL HIGH (ref 70–99)
HCT: 30 % — ABNORMAL LOW (ref 36.0–46.0)
Hemoglobin: 10.2 g/dL — ABNORMAL LOW (ref 12.0–15.0)
Potassium: 3.7 mmol/L (ref 3.5–5.1)
Sodium: 134 mmol/L — ABNORMAL LOW (ref 135–145)
TCO2: 19 mmol/L — ABNORMAL LOW (ref 22–32)

## 2021-09-19 LAB — TROPONIN I (HIGH SENSITIVITY)
Troponin I (High Sensitivity): 420 ng/L (ref <18)
Troponin I (High Sensitivity): 436 ng/L (ref <18)

## 2021-09-19 LAB — URINALYSIS, MICROSCOPIC (REFLEX)
Bacteria, UA: NONE SEEN
Squamous Epithelial / HPF: NONE SEEN (ref 0–5)

## 2021-09-19 LAB — TSH: TSH: 2.413 u[IU]/mL (ref 0.350–4.500)

## 2021-09-19 LAB — LIPASE, BLOOD: Lipase: 52 U/L — ABNORMAL HIGH (ref 11–51)

## 2021-09-19 MED ORDER — ONDANSETRON HCL 4 MG/2ML IJ SOLN
4.0000 mg | Freq: Once | INTRAMUSCULAR | Status: AC
  Administered 2021-09-19: 15:00:00 4 mg via INTRAVENOUS
  Filled 2021-09-19: qty 2

## 2021-09-19 MED ORDER — EZETIMIBE 10 MG PO TABS
10.0000 mg | ORAL_TABLET | Freq: Every day | ORAL | Status: DC
  Administered 2021-09-20: 09:00:00 10 mg via ORAL
  Filled 2021-09-19: qty 1

## 2021-09-19 MED ORDER — ASPIRIN EC 325 MG PO TBEC
325.0000 mg | DELAYED_RELEASE_TABLET | Freq: Every day | ORAL | Status: DC
  Administered 2021-09-20: 09:00:00 325 mg via ORAL
  Filled 2021-09-19: qty 1

## 2021-09-19 MED ORDER — ONDANSETRON HCL 4 MG PO TABS: 4.0000 mg | ORAL_TABLET | Freq: Four times a day (QID) | ORAL | Status: DC | PRN

## 2021-09-19 MED ORDER — ACETAMINOPHEN 650 MG RE SUPP: 650.0000 mg | Freq: Four times a day (QID) | RECTAL | Status: DC | PRN

## 2021-09-19 MED ORDER — SODIUM CHLORIDE 0.9% FLUSH
3.0000 mL | Freq: Two times a day (BID) | INTRAVENOUS | Status: DC
  Administered 2021-09-20: 02:00:00 3 mL via INTRAVENOUS

## 2021-09-19 MED ORDER — SENNOSIDES-DOCUSATE SODIUM 8.6-50 MG PO TABS: 1.0000 | ORAL_TABLET | Freq: Every evening | ORAL | Status: DC | PRN

## 2021-09-19 MED ORDER — LACTATED RINGERS IV SOLN
INTRAVENOUS | Status: DC
Start: 2021-09-19 — End: 2021-09-20

## 2021-09-19 MED ORDER — ONDANSETRON HCL 4 MG/2ML IJ SOLN: 4.0000 mg | Freq: Four times a day (QID) | INTRAMUSCULAR | Status: DC | PRN

## 2021-09-19 MED ORDER — ENOXAPARIN SODIUM 40 MG/0.4ML IJ SOSY
40.0000 mg | PREFILLED_SYRINGE | INTRAMUSCULAR | Status: DC
  Administered 2021-09-20: 09:00:00 40 mg via SUBCUTANEOUS
  Filled 2021-09-19: qty 0.4

## 2021-09-19 MED ORDER — ACETAMINOPHEN 325 MG PO TABS: 650.0000 mg | ORAL_TABLET | Freq: Four times a day (QID) | ORAL | Status: DC | PRN

## 2021-09-19 MED ORDER — METOPROLOL SUCCINATE ER 50 MG PO TB24
50.0000 mg | ORAL_TABLET | Freq: Every day | ORAL | Status: DC
  Administered 2021-09-20 (×2): 50 mg via ORAL
  Filled 2021-09-19 (×2): qty 1

## 2021-09-19 MED ORDER — LORAZEPAM 1 MG PO TABS: 1.0000 mg | ORAL_TABLET | Freq: Once | ORAL | Status: DC | PRN

## 2021-09-19 MED ORDER — FENTANYL CITRATE PF 50 MCG/ML IJ SOSY
50.0000 ug | PREFILLED_SYRINGE | Freq: Once | INTRAMUSCULAR | Status: DC
  Filled 2021-09-19: qty 1

## 2021-09-19 NOTE — ED Provider Notes (Signed)
[facility] EMERGENCY DEPARTMENT Provider Note   CSN: [id number] Arrival date & time: 09/19/21  1344     History Chief Complaint  Patient presents with   Weakness    [name] [name] is a 75 y.o. female presents emergency department with chief complaint of fatigue and nausea.  Patient is status post CABG 2 weeks ago.  She was doing well up until last night when she states she just began to feel "terrible."  She complains of generalized fatigue, loss of appetite, feeling weak all over, persistent nausea.  She denies abdominal pain, vomiting, diarrhea, fever, cough, chills.  She denies urinary symptoms.   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   Coronary artery disease 09/08/2021   NSTEMI (non-ST elevated myocardial infarction) (HCC) 08/30/2021    Past Surgical History:  Procedure Laterality Date   CORONARY ARTERY BYPASS GRAFT N/A 09/08/2021   Procedure: CORONARY ARTERY BYPASS GRAFTING (CABG) X 4  USING LEFT INTERNAL MAMMARY ARTERY AND RIGHT ENDOSCOPIC GREATER SAPHENOUS VEIN CONDUITS.;  Surgeon: Alleen Borne, MD;  Location: [facility] OR;  Service: Open Heart Surgery;  Laterality: N/A;   ENDOVEIN HARVEST OF GREATER SAPHENOUS VEIN Right 09/08/2021   Procedure: ENDOVEIN HARVEST OF GREATER SAPHENOUS VEIN;  Surgeon: Alleen Borne, MD;  Location: [facility] OR;  Service: Open Heart Surgery;  Laterality: Right;   LEFT HEART CATH AND CORONARY ANGIOGRAPHY N/A 09/01/2021   Procedure: LEFT HEART CATH AND CORONARY ANGIOGRAPHY;  Surgeon: Yvonne Kendall, MD;  Location: [facility] INVASIVE CV LAB;  Service: Cardiovascular;  Laterality: N/A;   TEE WITHOUT CARDIOVERSION N/A 09/08/2021   Procedure: TRANSESOPHAGEAL ECHOCARDIOGRAM (TEE);  Surgeon: Alleen Borne, MD;  Location: [facility] OR;  Service: Open Heart Surgery;  Laterality: N/A;     OB History   No obstetric history on  file.     Family History  Problem Relation Age of Onset   Premature CHD Father     Social History   Tobacco Use   Smoking status: Every Day    Packs/day: 1.50    Years: 30.00    Pack years: 45.00    Types: Cigarettes   Smokeless tobacco: Never  Vaping Use   Vaping Use: Never used  Substance Use Topics   Alcohol use: Not Currently   Drug use: Never    Home Medications Prior to Admission medications   Medication Sig Start Date End Date Taking? Authorizing Provider  acetaminophen (TYLENOL) 325 MG tablet Take 2 tablets (650 mg total) by mouth every 6 (six) hours as needed for mild pain. 09/12/21   Ardelle Balls, PA-C  aspirin EC 325 MG EC tablet Take 1 tablet (325 mg total) by mouth daily. 09/12/21   Ardelle Balls, PA-C  ezetimibe (ZETIA) 10 MG tablet Take 1 tablet (10 mg total) by mouth daily. 09/13/21   Barrett, Erin R, PA-C  metoprolol succinate (TOPROL-XL) 25 MG 24 hr tablet Take 1 tablet (25 mg total) by mouth daily. 09/13/21   Barrett, Erin R, PA-C  sertraline (ZOLOFT) 50 MG tablet Take 1 tablet (50 mg total) by mouth daily. 09/13/21 09/13/22  Barrett, Erin R, PA-C  traMADol (ULTRAM) 50 MG tablet Take 1 tablet (50 mg total) by mouth every 4 (four) hours as needed for moderate pain. 09/13/21   Barrett, Rae Roam, PA-C  Allergies    Budesonide  Review of Systems   Review of Systems  Neurological:  Positive for weakness.  Ten systems reviewed and are negative for acute change, except as noted in the HPI.   Physical Exam Updated Vital Signs BP (!) 150/93    Pulse (!) 58    Temp 98 F (36.7 C) (Oral)    Resp (!) 27    Ht 5\' 3"  (1.6 m)    Wt 62 kg    LMP  (LMP Unknown)    SpO2 97%    BMI 24.21 kg/m   Physical Exam Vitals and nursing note reviewed.  Constitutional:      General: She is not in acute distress.    Appearance: She is well-developed. She is not diaphoretic.  HENT:     Head: Normocephalic and atraumatic.     Right Ear: External ear normal.      Left Ear: External ear normal.     Nose: Nose normal.     Mouth/Throat:     Mouth: Mucous membranes are moist.  Eyes:     General: No scleral icterus.    Conjunctiva/sclera: Conjunctivae normal.  Cardiovascular:     Rate and Rhythm: Normal rate and regular rhythm.     Heart sounds: Normal heart sounds. No murmur heard.   No friction rub. No gallop.  Pulmonary:     Effort: Pulmonary effort is normal. No respiratory distress.     Breath sounds: Normal breath sounds.  Abdominal:     General: Bowel sounds are normal. There is no distension.     Palpations: Abdomen is soft. There is no mass.     Tenderness: There is abdominal tenderness in the right upper quadrant. There is guarding.  Musculoskeletal:     Cervical back: Normal range of motion.  Skin:    General: Skin is warm and dry.  Neurological:     Mental Status: She is alert and oriented to person, place, and time.  Psychiatric:        Behavior: Behavior normal.    ED Results / Procedures / Treatments   Labs (all labs ordered are listed, but only abnormal results are displayed) Labs Reviewed  COMPREHENSIVE METABOLIC PANEL - Abnormal; Notable for the following components:      Result Value   Sodium 131 (*)    CO2 19 (*)    Glucose, Bld 123 (*)    Calcium 8.8 (*)    Albumin 3.2 (*)    All other components within normal limits  CBC WITH DIFFERENTIAL/PLATELET - Abnormal; Notable for the following components:   RBC 2.86 (*)    Hemoglobin 9.3 (*)    HCT 27.7 (*)    All other components within normal limits  LIPASE, BLOOD - Abnormal; Notable for the following components:   Lipase 52 (*)    All other components within normal limits  URINALYSIS, ROUTINE W REFLEX MICROSCOPIC - Abnormal; Notable for the following components:   Specific Gravity, Urine <1.005 (*)    Hgb urine dipstick SMALL (*)    All other components within normal limits  I-STAT CHEM 8, ED - Abnormal; Notable for the following components:   Sodium 134  (*)    Glucose, Bld 117 (*)    TCO2 19 (*)    Hemoglobin 10.2 (*)    HCT 30.0 (*)    All other components within normal limits  TROPONIN I (HIGH SENSITIVITY) - Abnormal; Notable for the following components:   Troponin I (  High Sensitivity) 420 (*)    All other components within normal limits  RESP PANEL BY RT-PCR (FLU A&B, COVID) ARPGX2  URINALYSIS, MICROSCOPIC (REFLEX)  TSH  TROPONIN I (HIGH SENSITIVITY)    EKG EKG Interpretation  Date/Time:  Friday September 19 2021 13:55:16 EST Ventricular Rate:  91 PR Interval:  158 QRS Duration: 72 QT Interval:  361 QTC Calculation: 445 R Axis:   -19 Text Interpretation: Sinus tachycardia Ventricular trigeminy Borderline left axis deviation Abnormal R-wave progression, early transition Borderline T abnormalities, anterior leads Confirmed by Regan Lemming ([id number]) on 09/19/2021 3:20:48 PM  Radiology DG Chest Port 1 View  Result Date: 09/19/2021 CLINICAL DATA:  Weakness EXAM: PORTABLE CHEST 1 VIEW COMPARISON:  09/11/2021 FINDINGS: Numerous leads and wires project over the chest. Prior median sternotomy. Midline trachea. Moderate cardiomegaly. Atherosclerosis in the transverse aorta. Small left pleural effusion is slightly decreased. No pneumothorax. Mild pulmonary venous congestion. Improved bibasilar airspace disease. IMPRESSION: Overall improved aeration with decreased left pleural effusion and bibasilar airspace disease. Cardiomegaly with mild pulmonary venous congestion. Aortic Atherosclerosis (ICD10-I70.0). Electronically Signed   By:  Miyamoto M.D.   On: 09/19/2021 14:56   US ABDOMEN LIMITED RUQ (LIVER/GB)  Result Date: 09/19/2021 CLINICAL DATA:  Right upper quadrant abdominal pain. Prior CABG 2 weeks ago. EXAM: ULTRASOUND ABDOMEN LIMITED RIGHT UPPER QUADRANT COMPARISON:  05/11/2018 FINDINGS: Gallbladder: No gallstones or wall thickening visualized. No sonographic Murphy sign noted by sonographer. Common bile duct: Diameter: 0.5 cm,  within normal limits Liver: No focal lesion identified. Within normal limits in parenchymal echogenicity. Portal vein is patent on color Doppler imaging with normal direction of blood flow towards the liver. Other: Small amount of right pleural fluid observed. IMPRESSION: 1. No hepatobiliary abnormality is identified. 2. Small amount of right pleural fluid. Electronically Signed   By: Van Clines M.D.   On: 09/19/2021 15:04    Procedures Procedures   Medications Ordered in ED Medications  fentaNYL (SUBLIMAZE) injection 50 mcg (has no administration in time range)  ondansetron (ZOFRAN) injection 4 mg (4 mg Intravenous Given 09/19/21 1455)    ED Course  I have reviewed the triage vital signs and the nursing notes.  Pertinent labs & imaging results that were available during my care of the patient were reviewed by me and considered in my medical decision making (see chart for details).    MDM Rules/Calculators/A&P                         75 year old female who presents with generalized malaise, fatigue and nausea.  On my examination patient was point tender in her right upper quadrant. The differential diagnosis of weakness includes but is not limited to neurologic causes (GBS, myasthenia gravis, CVA, MS, ALS, transverse myelitis, spinal cord injury, CVA, botulism, ) and other causes: ACS, Arrhythmia, syncope, orthostatic hypotension, sepsis, hypoglycemia, electrolyte disturbance, hypothyroidism, respiratory failure, symptomatic anemia, dehydration, heat injury, polypharmacy, malignancy. I ordered and reviewed labs and included CBC which shows improving hemoglobin now 9.3, CMP shows no acute abnormalities, patient's lipase is slightly evaded and of insignificant value, patient's troponin 420 down from 1202 weeks ago.  I ordered and reviewed images including a portable 1 view chest x-ray and ultrasound of the right upper quadrant of the abdomen which showed no acute abnormalities EKG shows  sinus tachycardia with frequent PVCs.  Current plan is to recheck the patient's troponin.  If stable or downtrending feel the patient likely can go home with some antiemetics.  Respiratory panel reviewed shows no COVID or influenza signout given to Dr. Tobie Poet  Final Clinical Impression(s) / ED Diagnoses Final diagnoses:  RUQ abdominal pain    Rx / DC Orders ED Discharge Orders     None        Margarita Mail, PA-C 09/19/21 1616    Regan Lemming, MD 09/19/21 [phone]

## 2021-09-19 NOTE — Telephone Encounter (Signed)
Patient's sister, Inocencio Homes, called to report patient feels "deathly ill". Per patient she can't explain why she is feeling this way. States her fingers on her left hand have gone numb as well as her jaw. No c/o of pain other than incisional pain. Advised patient's sister to call EMS for transportation to the hospital. Inocencio Homes verbalizes understanding.

## 2021-09-19 NOTE — H&P (Signed)
History and Physical    [name] [name] OJJ:[id number] DOB: 06-Jul-1946 DOA: 09/19/2021  PCP: Sigmund Hazel, MD  Patient coming from: Home via EMS  I have personally briefly reviewed patient's old medical records in [facility]  Chief Complaint: Fatigue, nausea  HPI: [name] [name] is a 75 y.o. female with medical history significant for CAD s/p CABG 09/08/2021, autoimmune hepatitis, hypertension, hyperlipidemia, anxiety who presented to the ED for evaluation of nausea and fatigue.  Patient recently was admitted from 08/30/2021-09/13/2021 for NSTEMI and underwent CABG x4 on 09/08/2021.  Patient states that since discharge she has been progressing fairly well and has been active with her cardiac rehab.  She says yesterday (12/22) she had suddenly felt generally unwell and fatigued.  She developed nausea but has not had any emesis.  She has had poor appetite and has not been eating.  She has pain at her sternotomy surgical site but denies any typical chest pain.  She denies any dyspnea, abdominal pain, dysuria.  She says 3 days ago she had episode of diarrhea but none since.  She has been feeling anxious.  She has also been feeling like her heart has been pounding frequently.  ED Course:  Initial vitals showed BP 164/82, pulse 91, RR 17, temp 98.0 F, SPO2 100% on room air.  Labs show WBC 8.5, hemoglobin 9.3, platelets 296,000, sodium 131, potassium 3.6, bicarb 19, BUN 13, creatinine 0.5, serum glucose 123, LFTs within normal limits, lipase 52, TSH 2.413.  High-sensitivity troponin 420 > 436.  Urinalysis negative for UTI.  SARS-CoV-2 and influenza PCR negative.  RUQ ultrasound negative for hepatobiliary abnormality.  Portable chest x-ray shows improved aeration with decreased left pleural effusion and bibasilar airspace disease.  Cardiology were consulted and feel presentation is not consistent with ACS.  The hospitalist service was consulted to admit for further evaluation and  management.  Review of Systems: All systems reviewed and are negative except as documented in history of present illness above.   Past Medical History:  Diagnosis Date   Anxiety    Hepatitis     Past Surgical History:  Procedure Laterality Date   CORONARY ARTERY BYPASS GRAFT N/A 09/08/2021   Procedure: CORONARY ARTERY BYPASS GRAFTING (CABG) X 4  USING LEFT INTERNAL MAMMARY ARTERY AND RIGHT ENDOSCOPIC GREATER SAPHENOUS VEIN CONDUITS.;  Surgeon: Alleen Borne, MD;  Location: [facility] OR;  Service: Open Heart Surgery;  Laterality: N/A;   ENDOVEIN HARVEST OF GREATER SAPHENOUS VEIN Right 09/08/2021   Procedure: ENDOVEIN HARVEST OF GREATER SAPHENOUS VEIN;  Surgeon: Alleen Borne, MD;  Location: [facility] OR;  Service: Open Heart Surgery;  Laterality: Right;   LEFT HEART CATH AND CORONARY ANGIOGRAPHY N/A 09/01/2021   Procedure: LEFT HEART CATH AND CORONARY ANGIOGRAPHY;  Surgeon: Yvonne Kendall, MD;  Location: [facility] INVASIVE CV LAB;  Service: Cardiovascular;  Laterality: N/A;   TEE WITHOUT CARDIOVERSION N/A 09/08/2021   Procedure: TRANSESOPHAGEAL ECHOCARDIOGRAM (TEE);  Surgeon: Alleen Borne, MD;  Location: [facility] OR;  Service: Open Heart Surgery;  Laterality: N/A;    Social History:  reports that she has been smoking cigarettes. She has a 45.00 pack-year smoking history. She has never used smokeless tobacco. She reports that she does not currently use alcohol. She reports that she does not use drugs.  Allergies  Allergen Reactions   Budesonide Rash    Family History  Problem Relation Age of Onset   Premature CHD Father      Prior to Admission medications   Medication  Sig Start Date End Date Taking? Authorizing Provider  acetaminophen (TYLENOL) 325 MG tablet Take 2 tablets (650 mg total) by mouth every 6 (six) hours as needed for mild pain. 09/12/21  Yes Lars Pinks M, PA-C  aspirin EC 325 MG EC tablet Take 1 tablet (325 mg total) by mouth daily. 09/12/21  Yes Lars Pinks M, PA-C   ezetimibe (ZETIA) 10 MG tablet Take 1 tablet (10 mg total) by mouth daily. 09/13/21  Yes Barrett, Erin R, PA-C  metoprolol succinate (TOPROL-XL) 25 MG 24 hr tablet Take 1 tablet (25 mg total) by mouth daily. 09/13/21  Yes Barrett, Erin R, PA-C  sertraline (ZOLOFT) 50 MG tablet Take 1 tablet (50 mg total) by mouth daily. Patient taking differently: Take 50 mg by mouth as needed (anxiety). 09/13/21 09/13/22 Yes Barrett, Erin R, PA-C  traMADol (ULTRAM) 50 MG tablet Take 1 tablet (50 mg total) by mouth every 4 (four) hours as needed for moderate pain. 09/13/21  Yes Barrett, Lodema Hong, PA-C    Physical Exam: Vitals:   09/19/21 1800 09/19/21 1845 09/19/21 1950 09/19/21 2100  BP: (!) 154/90 (!) 166/65 (!) 152/63 (!) 160/79  Pulse: 68  85 90  Resp: 19 20 18 17   Temp:   98.5 F (36.9 C)   TempSrc:   Oral   SpO2: 97% 98% 96% 99%  Weight:      Height:       Constitutional: Resting in bed, NAD, calm, comfortable Eyes: PERRL, lids and conjunctivae normal ENMT: Mucous membranes are dry. Posterior pharynx clear of any exudate or lesions. Neck: normal, supple, no masses. Respiratory: clear to auscultation bilaterally, no wheezing, no crackles. Normal respiratory effort. No accessory muscle use.  Cardiovascular: Regular rate, frequent PVCs on telemetry and trigeminy pattern. No extremity edema. 2+ pedal pulses. Abdomen: no tenderness, no masses palpated. No hepatosplenomegaly. Bowel sounds positive.  Musculoskeletal: no clubbing / cyanosis. No joint deformity upper and lower extremities. Good ROM, no contractures. Normal muscle tone.  Skin: Sternotomy surgical wound appears to be healing well without evidence of active infection.  No rashes, lesions, ulcers. No induration Neurologic: CN 2-12 grossly intact. Sensation intact. Strength 5/5 in all 4.  Psychiatric: Normal judgment and insight. Alert and oriented x 3.  Anxious mood.   Labs on Admission: I have personally reviewed following labs and imaging  studies  CBC: Recent Labs  Lab 09/19/21 1406 09/19/21 1413  WBC 8.5  --   NEUTROABS 7.1  --   HGB 9.3* 10.2*  HCT 27.7* 30.0*  MCV 96.9  --   PLT 296  --    Basic Metabolic Panel: Recent Labs  Lab 09/19/21 1406 09/19/21 1413  NA 131* 134*  K 3.6 3.7  CL 101 102  CO2 19*  --   GLUCOSE 123* 117*  BUN 13 13  CREATININE 0.85 0.70  CALCIUM 8.8*  --    GFR: Estimated Creatinine Clearance: 51 mL/min (by C-G formula based on SCr of 0.7 mg/dL). Liver Function Tests: Recent Labs  Lab 09/19/21 1406  AST 22  ALT 12  ALKPHOS 51  BILITOT 0.6  PROT 6.9  ALBUMIN 3.2*   Recent Labs  Lab 09/19/21 1406  LIPASE 52*   No results for input(s): AMMONIA in the last 168 hours. Coagulation Profile: No results for input(s): INR, PROTIME in the last 168 hours. Cardiac Enzymes: No results for input(s): CKTOTAL, CKMB, CKMBINDEX, TROPONINI in the last 168 hours. BNP (last 3 results) No results for input(s): PROBNP in the  last 8760 hours. HbA1C: No results for input(s): HGBA1C in the last 72 hours. CBG: No results for input(s): GLUCAP in the last 168 hours. Lipid Profile: No results for input(s): CHOL, HDL, LDLCALC, TRIG, CHOLHDL, LDLDIRECT in the last 72 hours. Thyroid Function Tests: Recent Labs    09/19/21 1627  TSH 2.413   Anemia Panel: No results for input(s): VITAMINB12, FOLATE, FERRITIN, TIBC, IRON, RETICCTPCT in the last 72 hours. Urine analysis:    Component Value Date/Time   COLORURINE YELLOW 09/19/2021  09/19/2021 1403   LABSPEC <1.005 (L) 09/19/2021 1403   PHURINE 6.0 09/19/2021 1403   GLUCOSEU NEGATIVE 09/19/2021 1403   HGBUR SMALL (A) 09/19/2021 1403   BILIRUBINUR NEGATIVE 09/19/2021 1403   KETONESUR NEGATIVE 09/19/2021 1403   PROTEINUR NEGATIVE 09/19/2021 1403   NITRITE NEGATIVE 09/19/2021 1403   LEUKOCYTESUR NEGATIVE 09/19/2021 1403    Radiological Exams on Admission: DG Chest Port 1 View  Result Date: 09/19/2021 CLINICAL  DATA:  Weakness EXAM: PORTABLE CHEST 1 VIEW COMPARISON:  09/11/2021 FINDINGS: Numerous leads and wires project over the chest. Prior median sternotomy. Midline trachea. Moderate cardiomegaly. Atherosclerosis in the transverse aorta. Small left pleural effusion is slightly decreased. No pneumothorax. Mild pulmonary venous congestion. Improved bibasilar airspace disease. IMPRESSION: Overall improved aeration with decreased left pleural effusion and bibasilar airspace disease. Cardiomegaly with mild pulmonary venous congestion. Aortic Atherosclerosis (ICD10-I70.0). Electronically Signed   By: Abigail Miyamoto M.D.   On: 09/19/2021 14:56   US ABDOMEN LIMITED RUQ (LIVER/GB)  Result Date: 09/19/2021 CLINICAL DATA:  Right upper quadrant abdominal pain. Prior CABG 2 weeks ago. EXAM: ULTRASOUND ABDOMEN LIMITED RIGHT UPPER QUADRANT COMPARISON:  05/11/2018 FINDINGS: Gallbladder: No gallstones or wall thickening visualized. No sonographic Murphy sign noted by sonographer. Common bile duct: Diameter: 0.5 cm, within normal limits Liver: No focal lesion identified. Within normal limits in parenchymal echogenicity. Portal vein is patent on color Doppler imaging with normal direction of blood flow towards the liver. Other: Small amount of right pleural fluid observed. IMPRESSION: 1. No hepatobiliary abnormality is identified. 2. Small amount of right pleural fluid. Electronically Signed   By: Van Clines M.D.   On: 09/19/2021 15:04    EKG: Personally reviewed. Sinus rhythm with ventricular trigeminy.  Frequent PVCs/trigeminy is new when compared to prior.  Assessment/Plan Principal Problem:   Elevated troponin Active Problems:   S/P CABG x 4   Coronary artery disease   Anxiety   Hyperlipidemia   Anemia   Essential hypertension   [name] [name] is a 75 y.o. female with medical history significant for CAD s/p CABG 09/08/2021, autoimmune hepatitis, hypertension, hyperlipidemia, anxiety who is admitted with  fatigue, nausea, and elevated troponin.  Elevated troponin CAD s/p CABG 09/08/2021: Troponin elevated in 400s in post CABG setting, decreased from previous troponin level in the 1200s.  She is not having any typical chest pain.  Cardiology have evaluated patient and feel presentation is not consistent with an ACS. -Continue aspirin -Trend cardiac enzyme  Fatigue/nausea/poor appetite: No clear etiology.  Could be multifactorial related to her frequent PVCs, recent gastroenteritis, anxiety.  Lipase is 52, LFTs within normal limits.  Patient does appear dehydrated on admission. -Hydrate with IV fluids overnight -Advance diet as tolerated -Antiemetics as needed  Anemia: New anemia in the post CABG setting.  Hemoglobin stable at 9.3.  No obvious bleeding.  Will check anemia panel, currently no indication for transfusion.  Frequent PVCs: Increase Toprol-XL from 25 mg to 50 mg daily.  Hypertension: Increasing Toprol-XL as above.  Autoimmune hepatitis: Follows with [facility].  Previously on azathioprine which was held at time of her CABG.  LFTs within normal limits.  RUQ ultrasound reassuring.  Hyperlipidemia: Continue Zetia.  Anxiety: Patient states she stopped Zoloft as she felt it was worsening her anxiety.  She is anxious on admission, will order 1 dose of oral Ativan if needed for anxiety.  DVT prophylaxis: Lovenox Code Status: Full code, confirmed on admission Family Communication: Discussed with patient's sister at bedside Disposition Plan: From home and likely discharge to home pending clinical progress. Consults called: Cardiology Level of care: Telemetry Cardiac Admission status:  Status is: Observation  The patient remains OBS appropriate and will d/c before 2 midnights.  Zada Finders MD [facility]  If 7PM-7AM, please contact night-coverage [email or web address]  09/19/2021, 11:07 PM

## 2021-09-19 NOTE — ED Provider Notes (Signed)
°  Physical Exam  BP (!) 160/79    Pulse 90    Temp 98.5 F (36.9 C) (Oral)    Resp 17    Ht 5\' 3"  (1.6 m)    Wt 62 kg    LMP  (LMP Unknown)    SpO2 99%    BMI 24.21 kg/m   Physical Exam Vitals and nursing note reviewed.  Constitutional:      General: She is not in acute distress.    Appearance: She is well-developed.  HENT:     Head: Normocephalic and atraumatic.  Eyes:     Conjunctiva/sclera: Conjunctivae normal.  Cardiovascular:     Rate and Rhythm: Normal rate and regular rhythm.     Heart sounds: No murmur heard. Pulmonary:     Effort: Pulmonary effort is normal. No respiratory distress.     Breath sounds: Normal breath sounds.  Abdominal:     Palpations: Abdomen is soft.     Tenderness: There is no abdominal tenderness.  Musculoskeletal:        General: No swelling.     Cervical back: Neck supple.  Skin:    General: Skin is warm and dry.     Capillary Refill: Capillary refill takes less than 2 seconds.  Neurological:     Mental Status: She is alert.  Psychiatric:        Mood and Affect: Mood normal.    ED Course/Procedures     Procedures  MDM   Signout: P/w "feeling terrible" which started last night. +N/v, poor PO. RUQ pain. S/p CABG 2 weeks ago. RUQ negative. 1st trop 420, down from 1120 2 wks ago.  Plan at time of handoff: - F/u delta trop, viral panel  Patient's second troponin increased to 436 from 420.  Viral panel negative.  Patient continues to have persistent symptoms of nausea despite receiving antiemetics and does also note some right-sided jaw pain which she had when she presented for her NSTEMI several weeks ago.  Given persistence of symptoms and elevated troponin, I discussed the patient with cardiology who recommended hospitalist admission.  Cardiology will see the patient as a [place].  I discussed the patient with the hospitalist team on call who will admit the patient to their service.   Research scientist (medical), MD 09/20/21 0007    09/22/21, MD 09/22/21 [phone]

## 2021-09-19 NOTE — ED Triage Notes (Signed)
Pt BIB EMS for increasing weakness over the last 12hrs and flu-like sx, pt has a tooth infection and is concerned d/t her recent CABG on the 12th

## 2021-09-19 NOTE — Consult Note (Signed)
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 Since being discharged home after her recent CABG she has been feeling ill. She states that her appetite is not good and she has had a tooth ache. Took amoxicillin earlier today for this. She was supposed to have the tooth extracted prior to the CABG but this was postponed due to the surgery.  She initially presented on 08/30/2021 with complaints of intermittent chest pain.  She was documented to have elevated troponins (hsT 1217->1359). ECG showed anterolateral ST segment depressions. She ruled in for an NSTEMI. Echo showed normal LV function (EF 70%). Cardiac cath on 09/01/2021 showed severe 3-vessel CAD and she underwent 4-vessel CABG on 09/08/2021. D/c'd home on ASA, Zetia, and metoprolol succinate. Not placed on a statin due to her hx of hepatitis. Plan was to initiate PCSK9 therapy as an outpatient.  During current presentation, initial VS: BP 166/79 mmHg. HR 87 bpm, RR 18 and O2 sat 99%. Labs: Na 134, K 3.7, gluc 117, Cr 0.85, WBC 8.5, Hgb 10.2, hct 30, Plt 296. Trops are 420 and 436.  ECG 1223/2022 13:55, that I reviewed personally, shows sinus rhythm, frequent PVCs with no ST segment changes. CXR with improving aeration and decreased left pleural effusion. Normal abdominal ultrasound. Viral serologies negative.    Home Medications Prior to Admission medications   Medication Sig Start Date End Date Taking? Authorizing Provider  acetaminophen (TYLENOL) 325 MG tablet Take 2 tablets (650 mg total) by mouth  every 6 (six) hours as needed for mild pain. 09/12/21  Yes Doree Fudge M, PA-C  aspirin EC 325 MG EC tablet Take 1 tablet (325 mg total) by mouth daily. 09/12/21  Yes Doree Fudge M, PA-C  ezetimibe (ZETIA) 10 MG tablet Take 1 tablet (10 mg total) by mouth daily. 09/13/21  Yes Barrett, Erin R, PA-C  metoprolol succinate (TOPROL-XL) 25 MG 24 hr tablet Take 1 tablet (25 mg total) by mouth daily. 09/13/21  Yes Barrett, Erin R, PA-C  sertraline (ZOLOFT) 50 MG tablet Take 1 tablet (50 mg total) by mouth daily. Patient taking differently: Take 50 mg by mouth as needed (anxiety). 09/13/21 09/13/22 Yes Barrett, Erin R, PA-C  traMADol (ULTRAM) 50 MG tablet Take 1 tablet (50 mg total) by mouth every 4 (four) hours as needed for moderate pain. 09/13/21  Yes Barrett, Rae Roam, PA-C    Past Medical History: Past Medical History:  Diagnosis Date   Anxiety    Hepatitis     Past Surgical History: Past Surgical History:  Procedure Laterality Date   CORONARY ARTERY BYPASS GRAFT N/A 09/08/2021   Procedure: CORONARY ARTERY BYPASS GRAFTING (CABG) X 4  USING LEFT INTERNAL MAMMARY ARTERY AND RIGHT ENDOSCOPIC GREATER SAPHENOUS VEIN CONDUITS.;  Surgeon: Alleen Borne, MD;  Location: [facility];  Service: Open Heart Surgery;  Laterality: N/A;   ENDOVEIN HARVEST OF GREATER SAPHENOUS VEIN Right 09/08/2021   Procedure: ENDOVEIN HARVEST OF GREATER SAPHENOUS VEIN;  Surgeon: Alleen Borne, MD;  Location: [facility] OR;  Service: Open Heart Surgery;  Laterality: Right;   LEFT HEART CATH AND CORONARY  ANGIOGRAPHY N/A 09/01/2021   Procedure: LEFT HEART CATH AND CORONARY ANGIOGRAPHY;  Surgeon: Nelva Bush, MD;  Location: [place] [facility];  Service: Cardiovascular;  Laterality: N/A;   TEE WITHOUT CARDIOVERSION N/A 09/08/2021   Procedure: TRANSESOPHAGEAL ECHOCARDIOGRAM (TEE);  Surgeon: Gaye Pollack, MD;  Location: [place];  Service: Open Heart Surgery;  Laterality: N/A;    Family History: Family History  Problem  Relation Age of Onset   Premature CHD Father     Social History: Social History   Socioeconomic History   Marital status: Single    Spouse name: Not on file   Number of children: Not on file   Years of education: Not on file   Highest education level: Not on file  Occupational History   Not on file  Tobacco Use   Smoking status: Every Day    Packs/day: 1.50    Years: 30.00    Pack years: 45.00    Types: Cigarettes   Smokeless tobacco: Never  Vaping Use   Vaping Use: Never used  Substance and Sexual Activity   Alcohol use: Not Currently   Drug use: Never   Sexual activity: Not Currently  Other Topics Concern   Not on file  Social History Narrative   Not on file   Social Determinants of Health   Financial Resource Strain: Not on file  Food Insecurity: Not on file  Transportation Needs: Not on file  Physical Activity: Not on file  Stress: Not on file  Social Connections: Not on file    Allergies:  Allergies  Allergen Reactions   Budesonide Rash     Review of Systems: [y] = yes, [ ]  = no   General: Weight gain [ ] ; Weight loss [ ] ; Anorexia [ ] ; Fatigue [Y ]; Fever [ ] ; Chills [ ] ; Weakness [ ]   Cardiac: Chest pain/pressure [ ] ; Resting SOB [ ] ; Exertional SOB [ ] ; Orthopnea [ ] ; Pedal Edema [ ] ; Palpitations [ ] ; Syncope [ ] ; Presyncope [ ] ; Paroxysmal nocturnal dyspnea[ ]   Pulmonary: Cough [ ] ; Wheezing[ ] ; Hemoptysis[ ] ; Sputum [ ] ; Snoring [ ]   GI: Vomiting[ ] ; Dysphagia[ ] ; Melena[ ] ; Hematochezia [ ] ; Heartburn[ ] ; Abdominal pain [ ] ; Constipation [ ] ; Diarrhea [ ] ; BRBPR [ ]   GU: Hematuria[ ] ; Dysuria [ ] ; Nocturia[ ]   Vascular: Pain in legs with walking [ ] ; Pain in feet with lying flat [ ] ; Non-healing sores [ ] ; Stroke [ ] ; TIA [ ] ; Slurred speech [ ] ;  Neuro: Headaches[ ] ; Vertigo[ ] ; Seizures[ ] ; Paresthesias[ ] ;Blurred vision [ ] ; Diplopia [ ] ; Vision changes [ ]   Ortho/Skin: Arthritis [ ] ; Joint pain [ ] ; Muscle pain [ ] ; Joint swelling [ ] ; Back  Pain [ ] ; Rash [ ]   Psych: Depression[ ] ; Anxiety[Y]  Heme: Bleeding problems [ ] ; Clotting disorders [ ] ; Anemia [ ]   Endocrine: Diabetes [ ] ; Thyroid dysfunction[ ]      Objective:    Vital Signs:   Temp:  [98 F (36.7 C)-98.5 F (36.9 C)] 98.5 F (36.9 C) (12/23 1950) Pulse Rate:  [58-95] 90 (12/23 2100) Resp:  [17-27] 17 (12/23 2100) BP: (141-166)/(63-93) 160/79 (12/23 2100) SpO2:  [96 %-100 %] 99 % (12/23 2100) Weight:  [62 kg] 62 kg (12/23 1349)    Weight change: Filed Weights   09/19/21 1349  Weight: 62 kg    Intake/Output:  No intake or output data in the 24 hours ending 09/19/21 2119    Physical  Exam    General:  Anxious, No resp difficulty HEENT: normal Neck: supple. JVP . Carotids 2+ bilat; no bruits. No lymphadenopathy or thyromegaly appreciated. Cor: PMI nondisplaced. Regular rate & rhythm. No rubs, gallops or murmurs. Lungs: clear to auscultation bilaterally Abdomen: soft, nontender, nondistended. No hepatosplenomegaly. No bruits or masses. Good bowel sounds. Extremities: no cyanosis, clubbing, rash, edema Neuro: alert & orientedx3, cranial nerves grossly intact. moves all 4 extremities w/o difficulty.  Affect pleasant but anxious    EKG    ECG 1223/2022 13:55, that I reviewed personally, shows sinus rhythm, frequent PVCs with no ST segment changes.   Labs   Basic Metabolic Panel: Recent Labs  Lab 09/19/21 1406 09/19/21 1413  NA 131* 134*  K 3.6 3.7  CL 101 102  CO2 19*  --   GLUCOSE 123* 117*  BUN 13 13  CREATININE 0.85 0.70  CALCIUM 8.8*  --     Liver Function Tests: Recent Labs  Lab 09/19/21 1406  AST 22  ALT 12  ALKPHOS 51  BILITOT 0.6  PROT 6.9  ALBUMIN 3.2*   Recent Labs  Lab 09/19/21 1406  LIPASE 52*   No results for input(s): AMMONIA in the last 168 hours.  CBC: Recent Labs  Lab 09/19/21 1406 09/19/21 1413  WBC 8.5  --   NEUTROABS 7.1  --   HGB 9.3* 10.2*  HCT 27.7* 30.0*  MCV 96.9  --   PLT 296  --      Cardiac Enzymes: No results for input(s): CKTOTAL, CKMB, CKMBINDEX, TROPONINI in the last 168 hours.  BNP: BNP (last 3 results) No results for input(s): BNP in the last 8760 hours.  ProBNP (last 3 results) No results for input(s): PROBNP in the last 8760 hours.   CBG: No results for input(s): GLUCAP in the last 168 hours.  Coagulation Studies: No results for input(s): LABPROT, INR in the last 72 hours.   Imaging   DG Chest Port 1 View  Result Date: 09/19/2021 CLINICAL DATA:  Weakness EXAM: PORTABLE CHEST 1 VIEW COMPARISON:  09/11/2021 FINDINGS: Numerous leads and wires project over the chest. Prior median sternotomy. Midline trachea. Moderate cardiomegaly. Atherosclerosis in the transverse aorta. Small left pleural effusion is slightly decreased. No pneumothorax. Mild pulmonary venous congestion. Improved bibasilar airspace disease. IMPRESSION: Overall improved aeration with decreased left pleural effusion and bibasilar airspace disease. Cardiomegaly with mild pulmonary venous congestion. Aortic Atherosclerosis (ICD10-I70.0). Electronically Signed   By: Abigail Miyamoto M.D.   On: 09/19/2021 14:56   US ABDOMEN LIMITED RUQ (LIVER/GB)  Result Date: 09/19/2021 CLINICAL DATA:  Right upper quadrant abdominal pain. Prior CABG 2 weeks ago. EXAM: ULTRASOUND ABDOMEN LIMITED RIGHT UPPER QUADRANT COMPARISON:  05/11/2018 FINDINGS: Gallbladder: No gallstones or wall thickening visualized. No sonographic Murphy sign noted by sonographer. Common bile duct: Diameter: 0.5 cm, within normal limits Liver: No focal lesion identified. Within normal limits in parenchymal echogenicity. Portal vein is patent on color Doppler imaging with normal direction of blood flow towards the liver. Other: Small amount of right pleural fluid observed. IMPRESSION: 1. No hepatobiliary abnormality is identified. 2. Small amount of right pleural fluid. Electronically Signed   By: Van Clines M.D.   On: 09/19/2021 15:04      Medications:     Current Medications:  fentaNYL (SUBLIMAZE) injection  50 mcg Nasal Once        Assessment/Plan   Recent NSTEMI / CABG Her presentation does not seem consistent with an ACS. Initial troponins are flat (420  and 436). The ECG does not show any ST segment deviations. She does not endorse chest pain.  -Continue to trend enzymes -Serial ECGs -Continue ASA -Consider 2-D echo in AM to assess LV function   2.   Frequent PVCs  -Maintain serum K >4.0 and Mg >2.0 -Increase metoprolol succinate dose (if BP allows)  3.   Hypertension  -Continue metoprolol succinate  4.   Dental infection The patient reports worsening tooth pain over the past few days. She actually took a dose of amoxicillin earlier today. Likely needs an evaluation to rule out abscess / local infection   5.   Auto immune hepatitis Liver function tests appear normal on current admission. Abdominal US was normal   6.   Anxiety Previously took Zoloft      Lonie Peak, MD  09/19/2021, 9:19 PM  Cardiology Overnight Team Please contact [facility] for night-coverage after hours (4p -7a ) and weekends on [email or web address]

## 2021-09-20 ENCOUNTER — Encounter (HOSPITAL_COMMUNITY): Payer: Self-pay | Admitting: Internal Medicine

## 2021-09-20 DIAGNOSIS — I10 Essential (primary) hypertension: Secondary | ICD-10-CM | POA: Diagnosis not present

## 2021-09-20 DIAGNOSIS — I2510 Atherosclerotic heart disease of native coronary artery without angina pectoris: Secondary | ICD-10-CM | POA: Diagnosis not present

## 2021-09-20 DIAGNOSIS — R778 Other specified abnormalities of plasma proteins: Secondary | ICD-10-CM | POA: Diagnosis not present

## 2021-09-20 LAB — IRON AND TIBC
Iron: 32 ug/dL (ref 28–170)
Saturation Ratios: 10 % — ABNORMAL LOW (ref 10.4–31.8)
TIBC: 314 ug/dL (ref 250–450)
UIBC: 282 ug/dL

## 2021-09-20 LAB — BASIC METABOLIC PANEL
Anion gap: 8 (ref 5–15)
BUN: 7 mg/dL — ABNORMAL LOW (ref 8–23)
CO2: 22 mmol/L (ref 22–32)
Calcium: 8.6 mg/dL — ABNORMAL LOW (ref 8.9–10.3)
Chloride: 106 mmol/L (ref 98–111)
Creatinine, Ser: 0.71 mg/dL (ref 0.44–1.00)
GFR, Estimated: >60 mL/min (ref >60)
Glucose, Bld: 106 mg/dL — ABNORMAL HIGH (ref 70–99)
Potassium: 3.8 mmol/L (ref 3.5–5.1)
Sodium: 136 mmol/L (ref 135–145)

## 2021-09-20 LAB — FERRITIN: Ferritin: 222 ng/mL (ref 11–307)

## 2021-09-20 LAB — CBC
HCT: 25.6 % — ABNORMAL LOW (ref 36.0–46.0)
Hemoglobin: 8.4 g/dL — ABNORMAL LOW (ref 12.0–15.0)
MCH: 31.6 pg (ref 26.0–34.0)
MCHC: 32.8 g/dL (ref 30.0–36.0)
MCV: 96.2 fL (ref 80.0–100.0)
Platelets: 301 10*3/uL (ref 150–400)
RBC: 2.66 MIL/uL — ABNORMAL LOW (ref 3.87–5.11)
RDW: 13.8 % (ref 11.5–15.5)
WBC: 7 10*3/uL (ref 4.0–10.5)
nRBC: 0 % (ref 0.0–0.2)

## 2021-09-20 LAB — RETICULOCYTES
Immature Retic Fract: 24.3 % — ABNORMAL HIGH (ref 2.3–15.9)
RBC.: 2.61 MIL/uL — ABNORMAL LOW (ref 3.87–5.11)
Retic Count, Absolute: 71.8 10*3/uL (ref 19.0–186.0)
Retic Ct Pct: 2.8 % (ref 0.4–3.1)

## 2021-09-20 LAB — VITAMIN B12: Vitamin B-12: 720 pg/mL (ref 180–914)

## 2021-09-20 LAB — FOLATE: Folate: 20 ng/mL (ref >5.9)

## 2021-09-20 LAB — TROPONIN I (HIGH SENSITIVITY): Troponin I (High Sensitivity): 386 ng/L (ref <18)

## 2021-09-20 MED ORDER — POLYSACCHARIDE IRON COMPLEX 150 MG PO CAPS: 150.0000 mg | ORAL_CAPSULE | Freq: Every day | ORAL | 0 refills | Status: DC

## 2021-09-20 NOTE — Discharge Summary (Addendum)
Physician Discharge Summary  Patient ID: [name] [name] MRN: [id number] DOB/AGE: Sep 14, 1946 75 y.o.  Admit date: 09/19/2021 Discharge date: 09/20/2021  Admission Diagnoses:  Discharge Diagnoses:  Principal Problem:   Elevated troponin Active Problems:   S/P CABG x 4   Coronary artery disease   Anxiety   Hyperlipidemia   Anemia   Essential hypertension Dehydration. Recent gastroenteritis. Frequent PVCs  Autoimmune hepatitis. Iron deficient anemia. Discharged Condition: good  Hospital Course:   [name] [name] is a 75 y.o. female with medical history significant for CAD s/p CABG 09/08/2021, autoimmune hepatitis, hypertension, hyperlipidemia, anxiety who presented to the ED for evaluation of nausea and fatigue. Patient recently was admitted from 08/30/2021-09/13/2021 for NSTEMI and underwent CABG x4 on 09/08/2021. Upon arriving the hospital, she was found to have troponin of 420.  She is seen by cardiology, deemed not to be due to acute coronary syndrome. She did appear dehydrated, she was given fluids.  She feels better.  At this point, she is medically stable to be discharged. Patient is advised to follow-up with her PCP in 1 week, cardiology in 2 weeks.  She may go back to her GI to follow-up on autoimmune hepatitis. Patient also has iron deficient anemia, hemoglobin lower after fluids.  I will prescribe oral iron.  Consults: cardiology  Significant Diagnostic Studies:   Treatments: cardiac monitoring  Discharge Exam: Blood pressure (!) 145/77, pulse 78, temperature 98.1 F (36.7 C), temperature source Oral, resp. rate (!) 24, height 5\' 3"  (1.6 m), weight 59.5 kg, SpO2 99 %. General appearance: alert and cooperative Resp: clear to auscultation bilaterally Cardio: regular rate and rhythm, S1, S2 normal, no murmur, click, rub or gallop GI: soft, non-tender; bowel sounds normal; no masses,  no organomegaly Extremities: extremities normal, atraumatic, no cyanosis or  edema  Disposition: Discharge disposition: 01-Home or Self Care       Discharge Instructions     Diet - low sodium heart healthy   Complete by: As directed    Discharge wound care:   Complete by: As directed    Follow with pcp   Increase activity slowly   Complete by: As directed       Allergies as of 09/20/2021       Reactions   Budesonide Rash        Medication List     TAKE these medications    acetaminophen 325 MG tablet Commonly known as: TYLENOL Take 2 tablets (650 mg total) by mouth every 6 (six) hours as needed for mild pain.   aspirin 325 MG EC tablet Take 1 tablet (325 mg total) by mouth daily.   ezetimibe 10 MG tablet Commonly known as: ZETIA Take 1 tablet (10 mg total) by mouth daily.   metoprolol succinate 25 MG 24 hr tablet Commonly known as: TOPROL-XL Take 1 tablet (25 mg total) by mouth daily.   sertraline 50 MG tablet Commonly known as: Zoloft Take 1 tablet (50 mg total) by mouth daily. What changed:  when to take this reasons to take this   traMADol 50 MG tablet Commonly known as: ULTRAM Take 1 tablet (50 mg total) by mouth every 4 (four) hours as needed for moderate pain.               Discharge Care Instructions  (From admission, onward)           Start     Ordered   09/20/21 0000  Discharge wound care:  Comments: Follow with pcp   09/20/21 4035            Follow-up Information     Sigmund Hazel, MD Follow up in 1 week(s).   Specialty: Family Medicine Contact information: [address] [place] [address] [phone]         [name], MD Follow up in 2 week(s).   Specialties: Cardiology, Radiology Contact information: [address] [place] [address] [phone]                 Signed: Marrion Coy 09/20/2021, 9:55 AM

## 2021-09-23 ENCOUNTER — Other Ambulatory Visit: Payer: Self-pay

## 2021-09-23 ENCOUNTER — Ambulatory Visit (INDEPENDENT_AMBULATORY_CARE_PROVIDER_SITE_OTHER): Payer: Self-pay

## 2021-09-23 DIAGNOSIS — Z4802 Encounter for removal of sutures: Secondary | ICD-10-CM

## 2021-09-23 NOTE — Progress Notes (Signed)
Patient arrived for nurse visit to remove sutures post- procedure CABG with Dr. Laneta Simmers 09/08/21.  Sutures removed with no signs/ symptoms of infection noted.  Incisions well healed. Patient tolerated procedure well.  Patient/ family instructed to keep the incision sites clean and dry.  Patient/ family acknowledged instructions given.

## 2021-09-26 ENCOUNTER — Other Ambulatory Visit (HOSPITAL_COMMUNITY): Payer: Self-pay | Admitting: *Deleted

## 2021-09-26 DIAGNOSIS — Z951 Presence of aortocoronary bypass graft: Secondary | ICD-10-CM

## 2021-09-26 DIAGNOSIS — I214 Non-ST elevation (NSTEMI) myocardial infarction: Secondary | ICD-10-CM

## 2021-09-30 DIAGNOSIS — I2510 Atherosclerotic heart disease of native coronary artery without angina pectoris: Secondary | ICD-10-CM | POA: Diagnosis not present

## 2021-09-30 DIAGNOSIS — Z87891 Personal history of nicotine dependence: Secondary | ICD-10-CM | POA: Diagnosis not present

## 2021-09-30 DIAGNOSIS — K047 Periapical abscess without sinus: Secondary | ICD-10-CM | POA: Diagnosis not present

## 2021-09-30 DIAGNOSIS — Z951 Presence of aortocoronary bypass graft: Secondary | ICD-10-CM | POA: Diagnosis not present

## 2021-09-30 DIAGNOSIS — R110 Nausea: Secondary | ICD-10-CM | POA: Diagnosis not present

## 2021-10-06 NOTE — Progress Notes (Signed)
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  Past Medical History    Past Medical History:  Diagnosis Date   Anxiety    Hepatitis    Past Surgical History:  Procedure Laterality Date   CORONARY ARTERY BYPASS GRAFT N/A 09/08/2021   Procedure: CORONARY ARTERY BYPASS GRAFTING (CABG) X 4  USING LEFT INTERNAL MAMMARY ARTERY AND RIGHT ENDOSCOPIC GREATER SAPHENOUS VEIN CONDUITS.;  Surgeon: Alleen Borne, MD;  Location: [facility] OR;  Service: Open Heart Surgery;  Laterality: N/A;   ENDOVEIN HARVEST OF GREATER SAPHENOUS VEIN Right 09/08/2021   Procedure: ENDOVEIN HARVEST OF GREATER SAPHENOUS VEIN;  Surgeon: Alleen Borne, MD;  Location: [facility] OR;  Service: Open Heart Surgery;  Laterality: Right;   LEFT HEART CATH AND CORONARY ANGIOGRAPHY N/A 09/01/2021   Procedure: LEFT HEART CATH AND CORONARY ANGIOGRAPHY;  Surgeon: Yvonne Kendall, MD;  Location: [facility] INVASIVE CV LAB;  Service: Cardiovascular;  Laterality: N/A;   TEE WITHOUT CARDIOVERSION N/A 09/08/2021   Procedure: TRANSESOPHAGEAL ECHOCARDIOGRAM (TEE);  Surgeon: Alleen Borne, MD;  Location: [facility] OR;  Service: Open Heart Surgery;  Laterality: N/A;    Allergies  Allergies  Allergen Reactions   Budesonide Rash    History of Present Illness    76 year old female with above past medical history including CAD s/p NSTEMI, s/p CABG x4, hypertension, hyperlipidemia, mild carotid artery stenosis, 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  She was recently hospitalized from 08/31/2019 to  09/13/2021 in the setting of exertional chest discomfort/NSTEMI. Cardiac catheterization on 09/01/2021 showed severe three-vessel CAD (dLM 50%, p-mLAD 50%, mLAD-1 90%, mLAD-2 50%, D1 20%, o-pCx 70%, dCx 20%, p-mRCA 90%, dRCA 50%, RPAV 40%). Carotid dopplers showed 40-59% RICA stenosis, 1 to 39% LICA stenosis. Echo showed EF >65%, moderately increased LV wall thickness, moderate concentric LVH. She underwent CABG x4 (SVG-LAD, SVG-RCA, and Sequential SVG-OM1 and OM2) on 09/08/2021. She was started on Zoloft for increased anxiety and depression. She was discharged home in stable condition on 09/13/21, but subsequently returned to the ED on 09/19/2021 in the setting of nausea and fatigue. Troponin was elevated at 420, 436. Cardiology was consulted. EKG showed frequent PVCs, no ST changes. Findings not consistent with ACS. Abdominal ultrasound was normal.  She was given IV fluids in the setting of probable dehydration, acute gastroenteritis, and was discharged home in stable condition patient is to follow-up with GI as an outpatient.  She presents today for follow-up accompanied by her sister who is visiting from out of town to help with her recovery post CABG. Since her hospitalization and subsequent ED visit, she has done well overall from a cardiac standpoint.  She is walking total of 1-2 miles per day, divided into multiple walks throughout the day. She does have stable mild dyspnea on exertion at times. She denies any symptoms of angina. She states her palpitations have almost completely resolved on increased dose of metoprolol. Her blood pressure has been elevated at home. She is using a wrist cuff to check her blood pressures.  Her biggest concern today is that she has a  loose tooth that is needing to be pulled. She states it is limiting the foods that she can eat as well as her ability to safely use her incentive spirometer. Otherwise, she has no concerns or complaints today.  Home Medications    Current  Outpatient Medications  Medication Sig Dispense Refill   aspirin EC 325 MG EC tablet Take 1 tablet (325 mg total) by mouth daily. 30 tablet 0   iron polysaccharides (NIFEREX) 150 MG capsule Take 1 capsule (150 mg total) by mouth daily. 30 capsule 0   losartan (COZAAR) 25 MG tablet Take 1 tablet (25 mg total) by mouth daily. 90 tablet 1   metoprolol succinate (TOPROL-XL) 25 MG 24 hr tablet Take 1 tablet (25 mg total) by mouth daily. 30 tablet 3   acetaminophen (TYLENOL) 325 MG tablet Take 2 tablets (650 mg total) by mouth every 6 (six) hours as needed for mild pain. (Patient not taking: Reported on 10/07/2021)     ezetimibe (ZETIA) 10 MG tablet Take 1 tablet (10 mg total) by mouth daily. (Patient not taking: Reported on 10/07/2021) 30 tablet 3   Iron Polysacch Cmplx-B12-FA (NIFEREX-150 FORTE PO)  (Patient not taking: Reported on 10/07/2021)     sertraline (ZOLOFT) 50 MG tablet Take 1 tablet (50 mg total) by mouth daily. (Patient not taking: Reported on 10/07/2021) 30 tablet 2   traMADol (ULTRAM) 50 MG tablet Take 1 tablet (50 mg total) by mouth every 4 (four) hours as needed for moderate pain. (Patient not taking: Reported on 10/07/2021) 30 tablet 0   No current facility-administered medications for this visit.     Review of Systems    She denies chest pain, palpitations, pnd, orthopnea, n, v, dizziness, syncope, edema, weight gain, or early satiety. All other systems reviewed and are otherwise negative except as noted above.     Cardiac Rehabilitation Eligibility Assessment  The patient is ready to start cardiac rehabilitation pending clearance from the cardiac surgeon.   Physical Exam    VS:  BP (!) 152/82    Pulse 75    Ht 5\' 3"  (1.6 m)    Wt 130 lb 11.2 oz (59.3 kg)    LMP  (LMP Unknown)    BMI 23.15 kg/m   GEN: Well nourished, well developed, in no acute distress. HEENT: normal. Neck: Supple, no JVD, carotid bruits, or masses. Cardiac: RRR, no murmurs, rubs, or gallops. No clubbing,  cyanosis, edema.  Radials/DP/PT 2+ and equal bilaterally.  Respiratory:  Respirations regular and unlabored, clear to auscultation bilaterally. GI: Soft, nontender, nondistended, BS + x 4. MS: no deformity or atrophy. Skin: warm and dry, no rash. Mid-sternal incision with approximated edges and no signs of infection. Neuro:  Strength and sensation are intact. Psych: Normal affect.  Accessory Clinical Findings    ECG personally reviewed by me today - NSR, 75 bpm, low voltage, non-specific ST changes post CABG - no acute changes.  Lab Results  Component Value Date   WBC 7.0 09/20/2021   HGB 8.4 (L) 09/20/2021   HCT 25.6 (L) 09/20/2021   MCV 96.2 09/20/2021   PLT 301 09/20/2021   Lab Results  Component Value Date   CREATININE 0.71 09/20/2021   BUN 7 (L) 09/20/2021   NA 136 09/20/2021   K 3.8 09/20/2021   CL 106 09/20/2021   CO2 22 09/20/2021   Lab Results  Component Value Date   ALT 12 09/19/2021   AST 22 09/19/2021   ALKPHOS 51 09/19/2021  BILITOT 0.6 09/19/2021   Lab Results  Component Value Date   CHOL 205 (H) 08/31/2021   HDL 49 08/31/2021   LDLCALC 145 (H) 08/31/2021   TRIG 54 08/31/2021   CHOLHDL 4.2 08/31/2021    Lab Results  Component Value Date   HGBA1C 5.5 08/31/2021    Assessment & Plan   1. CAD s/p CABG x4: Cath on 09/01/2021 in the setting of NSTEMI showed three-vessel CAD. She underwent CABG x4 on 09/08/2021.  She returned to the ED on 09/19/2021 nausea and fatigue.  Hemoglobin was low at the time at 8.4.  Troponin was mildly elevated, however there was no concern for ACS at the time. Stable with no anginal symptoms. She is walking 1 to 2 miles daily, in divided walks.  She has follow-up with Dr. Lavinia SharpsBartel in a week. Zetia was recently discontinued per PCP due to GI upset. She has an appointment tomorrow with the lipid clinic for consideration of PCSK9 inhibitor, though she states she is hesitant to self administer injections. Continue ASA 325 x 3 months  then 81 mg daily, per Dr. Sharee PimpleBartle's recommendation.  Continue metoprolol.  Blood pressure is elevated in office today with reported elevated home readings.  We will start losartan 25 mg daily.  She will monitor home blood pressures.  Repeat CBC, BMET in 1 week.  We will defer cardiac rehab readiness to [facility].    2. Hypertension: BP elevated in office today 152/82.  Start losartan as above.  Repeat BMET as above.  Will monitor home blood pressure and report readings consistently greater than 130/80.  I have asked her to purchase a arm cuff rather than using a wrist cuff. Otherwise, continue current antihypertensive regimen.   3. Loose tooth: She has a loose tooth on the left side that is dangling. This is her greatest complaint today. She was prescribed amoxicillin by her PCP.  She states she was told by her dentist that she needed cardiac clearance to have tooth pulled.  Will defer cardiac clearance for dental extraction to Dr. Erin FullingBartel's office as she has a follow-up appointment with office scheduled for next week. Encouraged soft diet as tolerated.   4. Hyperlipidemia: LDL was 145 on 08/31/2021. 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  Zetia recently discontinued DC'd by PCP in the setting of GI upset.  She has an appointment with lipid clinic tomorrow as above.  5. Mild carotid artery stenosis:  Carotid dopplers on 09/02/2021 showed 40-59% RICA stenosis, 1 to 39% LICA stenosis.  No bruit on exam today.  Denies dizziness, presyncope, or syncope.  Repeat Dopplers as needed. Continue ASA.  Lipid clinic referral pending as above.  6. Iron deficiency anemia: Hemoglobin 8.4 on 09/20/2021.  She is taking iron daily.  She does report some mild fatigue and mild stable dyspnea on exertion.  Repeat CBC in 1 week as above.  7. Tobacco abuse: She has stopped smoking since her surgery.  8. Disposition: Follow-up in 2 months with Dr. Shari ProwsPemberton.  Joylene GrapesEmily C Shadiamond Koska, NP 10/07/2021, 11:00 AM

## 2021-10-07 ENCOUNTER — Ambulatory Visit (HOSPITAL_BASED_OUTPATIENT_CLINIC_OR_DEPARTMENT_OTHER): Payer: Medicare HMO | Admitting: Nurse Practitioner

## 2021-10-07 ENCOUNTER — Other Ambulatory Visit: Payer: Self-pay

## 2021-10-07 ENCOUNTER — Encounter (HOSPITAL_BASED_OUTPATIENT_CLINIC_OR_DEPARTMENT_OTHER): Payer: Self-pay | Admitting: Nurse Practitioner

## 2021-10-07 VITALS — BP 152/82 | HR 75 | Ht 63.0 in | Wt 130.7 lb

## 2021-10-07 DIAGNOSIS — Z951 Presence of aortocoronary bypass graft: Secondary | ICD-10-CM | POA: Diagnosis not present

## 2021-10-07 DIAGNOSIS — E785 Hyperlipidemia, unspecified: Secondary | ICD-10-CM | POA: Diagnosis not present

## 2021-10-07 DIAGNOSIS — K0889 Other specified disorders of teeth and supporting structures: Secondary | ICD-10-CM

## 2021-10-07 DIAGNOSIS — Z720 Tobacco use: Secondary | ICD-10-CM | POA: Diagnosis not present

## 2021-10-07 DIAGNOSIS — I779 Disorder of arteries and arterioles, unspecified: Secondary | ICD-10-CM | POA: Diagnosis not present

## 2021-10-07 DIAGNOSIS — I10 Essential (primary) hypertension: Secondary | ICD-10-CM | POA: Diagnosis not present

## 2021-10-07 DIAGNOSIS — I2510 Atherosclerotic heart disease of native coronary artery without angina pectoris: Secondary | ICD-10-CM | POA: Diagnosis not present

## 2021-10-07 DIAGNOSIS — D649 Anemia, unspecified: Secondary | ICD-10-CM

## 2021-10-07 MED ORDER — LOSARTAN POTASSIUM 25 MG PO TABS: 25.0000 mg | ORAL_TABLET | Freq: Every day | ORAL | 1 refills | Status: DC

## 2021-10-07 NOTE — Patient Instructions (Signed)
Medication Instructions:  Your physician has recommended you make the following change in your medication:   START Losartan one 25mg  tablet daily  *If you need a refill on your cardiac medications before your next appointment, please call your pharmacy*   Lab Work: Your physician recommends that you return for lab work in 1 week for BMP, CBC.   Please return for Lab work. You may come to the...   [facility] (3rd floor) [address] [place], [place] [address]  Open: 8am-Noon and 1pm-4:30pm   [facility] at [facility] [address]- Any location  **no appointments needed**    If you have labs (blood work) drawn today and your tests are completely normal, you will receive your results only by: [employer] (if you have MyChart) OR A paper copy in the mail If you have any lab test that is abnormal or we need to change your treatment, we will call you to review the results.   Testing/Procedures: None ordered today.   Follow-Up: At [facility], you and your health needs are our priority.  As part of our continuing mission to provide you with exceptional heart care, we have created designated Provider Care Teams.  These Care Teams include your primary Cardiologist (physician) and Advanced Practice Providers (APPs -  Physician Assistants and Nurse Practitioners) who all work together to provide you with the care you need, when you need it.  We recommend signing up for the patient portal called "MyChart".  Sign up information is provided on this After Visit Summary.  MyChart is used to connect with patients for Virtual Visits (Telemedicine).  Patients are able to view lab/test results, encounter notes, upcoming appointments, etc.  Non-urgent messages can be sent to your provider as well.   To learn more about what you can do with MyChart, go to [email or web address].    Your next appointment:   11/25/21 at 9:20AM with  Dr. Shari Prows at [facility]   Other Instructions  Heart Healthy Diet Recommendations: A low-salt diet is recommended. Meats should be grilled, baked, or boiled. Avoid fried foods. Focus on lean protein sources like fish or chicken with vegetables and fruits. The American Heart Association is a Chief Technology Officer!  American Heart Association Diet and Lifeystyle Recommendations    Exercise recommendations: The American Heart Association recommends 150 minutes of moderate intensity exercise weekly. Try 30 minutes of moderate intensity exercise 4-5 times per week. This could include walking, jogging, or swimming.  Tips to Measure your Blood Pressure Correctly Check your blood pressure at least 2 hours after your medications.   Contact our office if blood pressure consistently more than 130/80.  Here's what you can do to ensure a correct reading:  Don't drink a caffeinated beverage or smoke during the 30 minutes before the test.  Sit quietly for five minutes before the test begins.  During the measurement, sit in a chair with your feet on the floor and your arm supported so your elbow is at about heart level.  The inflatable part of the cuff should completely cover at least 80% of your upper arm, and the cuff should be placed on bare skin, not over a shirt.  Don't talk during the measurement.  Blood pressure categories  Blood pressure category SYSTOLIC (upper number)  DIASTOLIC (lower number)  Normal Less than 120 mm Hg and Less than 80 mm Hg  Elevated 120-129 mm Hg and Less than 80 mm Hg  High blood pressure: Stage 1 hypertension 130-139 mm Hg or 80-89 mm Hg  High blood pressure: Stage 2 hypertension 140 mm Hg or higher or 90 mm Hg or higher  Hypertensive crisis (consult your doctor immediately) Higher than 180 mm Hg and/or Higher than 120 mm Hg  Source: American Heart Association and American Stroke Association. For more on getting your blood pressure under control, buy  Controlling Your Blood Pressure, a Special Health Report from [facility].   Blood Pressure Log   Date   Time  Blood Pressure  Position  Example: Nov 1 9 AM 124/78 sitting

## 2021-10-08 ENCOUNTER — Ambulatory Visit: Payer: Medicare HMO | Admitting: Pharmacist

## 2021-10-08 DIAGNOSIS — E785 Hyperlipidemia, unspecified: Secondary | ICD-10-CM

## 2021-10-08 DIAGNOSIS — I10 Essential (primary) hypertension: Secondary | ICD-10-CM | POA: Diagnosis not present

## 2021-10-08 NOTE — Addendum Note (Signed)
Addended by: Marlene Lard on: 10/08/2021 07:57 AM   Modules accepted: Orders

## 2021-10-08 NOTE — Patient Instructions (Signed)
Repatha Praluent  Please all me at [phone] with any questions I will make sure this medication is ok with your primary care doctor

## 2021-10-08 NOTE — Progress Notes (Unsigned)
Patient ID: [name] [name]                 DOB: 1946/01/08                    MRN: [id number]     HPI: [name] [name] is a 76 y.o. female patient of Dr. Jacolyn Reedy referred to lipid clinic by [name] Browner, NP. PMH is significant for CAD s/p NSTEMI, s/p CABG x4, HTN, HLD, mild carotid artery stenosis, autoimmune hepatitis (managed by GI), iron def anemia, tobacco abuse and anxiety.  She was recently hospitalized from 08/31/2019 to 09/13/2021 in the setting of exertional chest discomfort/NSTEMI. Cardiac catheterization on 09/01/2021 showed severe three-vessel CAD (40-59% RICA stenosis, 1 to [id number] LICA stenosis.) Echo showed EF >65%, moderately increased LV wall thickness, moderate concentric LVH. She underwent CABG x4 on 09/08/2021. She was discharged home in stable condition on 09/13/21, but subsequently returned to the ED with RUQ pain on 09/19/2021 with negative work-up for ACS.  Pt reported Zetia was recently discontinued by PCP d/t stomach upset. She was referred to the lipid clinic for consideration of PCSK9 inhibitor, though she states she is hesitant to self administer injections.   Today, pt presents to clinic accompanied by her sister. Due to recent CABG, pt has not been taking immunosuppressant therapy (azathioprine 75 mg daily) since 12/3. Pt reports she has been taking losartan 25 mg daily, metoprolol succinate 25 mg daily, aspirin 325 mg daily, and Niferex 150 mg daily. BP ranges from 150-180/80s at home, but has white coat hypertension and can shoot up to SBP of 200s. She was prescribed amoxicillin by her dentist. Was supposed to have tooth pulled but then had her heart attack. Her tooth fell out yesterday and would like guidance on whether she needs to continue taking this medication. Recommended she call her dentist office and Dr. Earlene Plater office as she needed it for prophylaxis.  Pt still walking a couple of miles a day. Endorses some SOB while walking. Pt restarted taking Zetia 10 mg  daily since seeing Emily yesterday. Pt has a fear of blood and needles, is hesitant to start a medication with injections. Expressed concerns with not knowing who to contact about refills on medications. Pt is concerned whether the PCSK9 inhibitor will increase LFTs.  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)  Risk Factors: CAD s/p NSTEMI, CABG x4, HTN, tobacco abuse  LDL goal: <55 mg/dL (recent ACS, HTN, age, smoker)  Diet: Diet limited by loose tooth pain  Exercise: walking 1-2 miles daily   Family History: Father: Premature CHD  Social History: Retired 2019, not driving, working to quit, heavy smoker for 56 years; denies alcohol or substance use  Labs: 08/31/2021: TC 205, TGL 54, LDL 145, HDL 49  Past Medical History:  Diagnosis Date   Anxiety    Hepatitis     Current Outpatient Medications on File Prior to Visit  Medication Sig Dispense Refill   acetaminophen (TYLENOL) 325 MG tablet Take 2 tablets (650 mg total) by mouth every 6 (six) hours as needed for mild pain. (Patient not taking: Reported on 10/07/2021)     aspirin EC 325 MG EC tablet Take 1 tablet (325 mg total) by mouth daily. 30 tablet 0   ezetimibe (ZETIA) 10 MG tablet Take 1 tablet (10 mg total) by mouth daily. (Patient not taking: Reported on 10/07/2021) 30 tablet 3  Iron Polysacch Cmplx-B12-FA (NIFEREX-150 FORTE PO)  (Patient not taking: Reported on 10/07/2021)     iron polysaccharides (NIFEREX) 150 MG capsule Take 1 capsule (150 mg total) by mouth daily. 30 capsule 0   losartan (COZAAR) 25 MG tablet Take 1 tablet (25 mg total) by mouth daily. 90 tablet 1   metoprolol succinate (TOPROL-XL) 25 MG 24 hr tablet Take 1 tablet (25 mg total) by mouth daily. 30 tablet 3   sertraline (ZOLOFT) 50 MG tablet Take 1 tablet (50 mg total) by mouth daily. (Patient not taking: Reported on 10/07/2021) 30 tablet 2   traMADol (ULTRAM) 50 MG  tablet Take 1 tablet (50 mg total) by mouth every 4 (four) hours as needed for moderate pain. (Patient not taking: Reported on 10/07/2021) 30 tablet 0   No current facility-administered medications on file prior to visit.    Allergies  Allergen Reactions   Budesonide Rash    Assessment/Plan:  1. Hyperlipidemia - LDL of 145 is elevated above goal <55 secondary to recent recent ACS, HTN, age, smoking hx. Will continue Zetia 10 mg daily for now. Counseled patient on efficacy of PCSK9 inhibitors and current options available given her autoimmune hepatitis. Plan to initiate a PCSK9 inhibitor, pending approval from GI MD, Charlean Sanfilippo. Given hesitancy with starting an injectable medication, pt given time to consider her options. We discussed the clinical differences between PCSK9i and zetia. We will call back to determine if she is ready to initiate injectable therapy once we hear back from GI. If pt agrees to a PCSK9, we will run insurance to determine eligibility/cost and schedule a follow up to counsel the patient through first injection in office if she desires. If patient starts on PCKS9i, we will stop zetia to avoid any confusion over potential side effects/effects on LFT (although I don't expect any).  2. HTN- Patient reports elevated blood pressures at home and BP was elevated at office with Raquel Sarna. Losartan  25mg  daily was started. She is to come for BMP next week. I will discuss with Raquel Sarna the plan for follow up of her BP. We would be happy to help manage if needed.   Jethro Poling, PharmD Student [phone] assisted in this visit.  Addendum: I did called and leave message with RN for Dr. Damita Lack confirming he is ok with starting PCSK9i.

## 2021-10-10 ENCOUNTER — Telehealth: Payer: Self-pay | Admitting: Pharmacist

## 2021-10-10 ENCOUNTER — Other Ambulatory Visit: Payer: Self-pay | Admitting: Physician Assistant

## 2021-10-10 NOTE — Telephone Encounter (Signed)
Pam from [facility] called back and left VM. I called Pam back. She said that Dr. Corky Sing had no concerns about adding a PCSK9i and it was ok to start.

## 2021-10-13 NOTE — Telephone Encounter (Signed)
PA for Praluent 150mg  submitted to [facility]. Approved through 09/27/22. Called pt and LVM for her to call back. Will also need to discuss her BP and get her scheduled for f/u for that per Dr. 09/29/22.

## 2021-10-14 ENCOUNTER — Other Ambulatory Visit: Payer: Self-pay | Admitting: Surgery

## 2021-10-14 ENCOUNTER — Encounter: Payer: Self-pay | Admitting: Pharmacist

## 2021-10-14 DIAGNOSIS — D649 Anemia, unspecified: Secondary | ICD-10-CM | POA: Diagnosis not present

## 2021-10-14 DIAGNOSIS — Z951 Presence of aortocoronary bypass graft: Secondary | ICD-10-CM

## 2021-10-14 DIAGNOSIS — I10 Essential (primary) hypertension: Secondary | ICD-10-CM | POA: Diagnosis not present

## 2021-10-15 ENCOUNTER — Ambulatory Visit (INDEPENDENT_AMBULATORY_CARE_PROVIDER_SITE_OTHER): Payer: Self-pay | Admitting: Surgery

## 2021-10-15 ENCOUNTER — Ambulatory Visit
Admission: RE | Admit: 2021-10-15 | Discharge: 2021-10-15 | Disposition: A | Discharge status: Home / Self Care | Payer: Medicare HMO | Source: Ambulatory Visit | Attending: Surgery | Admitting: Surgery

## 2021-10-15 ENCOUNTER — Other Ambulatory Visit: Payer: Self-pay

## 2021-10-15 ENCOUNTER — Telehealth (HOSPITAL_BASED_OUTPATIENT_CLINIC_OR_DEPARTMENT_OTHER): Payer: Self-pay

## 2021-10-15 ENCOUNTER — Encounter: Payer: Self-pay | Admitting: Surgery

## 2021-10-15 VITALS — BP 159/111 | HR 82 | Resp 20 | Ht 63.0 in | Wt 129.0 lb

## 2021-10-15 DIAGNOSIS — J9811 Atelectasis: Secondary | ICD-10-CM | POA: Diagnosis not present

## 2021-10-15 DIAGNOSIS — Z951 Presence of aortocoronary bypass graft: Secondary | ICD-10-CM

## 2021-10-15 DIAGNOSIS — I10 Essential (primary) hypertension: Secondary | ICD-10-CM

## 2021-10-15 DIAGNOSIS — R918 Other nonspecific abnormal finding of lung field: Secondary | ICD-10-CM | POA: Diagnosis not present

## 2021-10-15 DIAGNOSIS — I700 Atherosclerosis of aorta: Secondary | ICD-10-CM | POA: Diagnosis not present

## 2021-10-15 LAB — CBC
Hematocrit: 40.3 % (ref 34.0–46.6)
Hemoglobin: 12.9 g/dL (ref 11.1–15.9)
MCH: 29.1 pg (ref 26.6–33.0)
MCHC: 32 g/dL (ref 31.5–35.7)
MCV: 91 fL (ref 79–97)
Platelets: 255 10*3/uL (ref 150–450)
RBC: 4.43 x10E6/uL (ref 3.77–5.28)
RDW: 13.2 % (ref 11.7–15.4)
WBC: 7.2 10*3/uL (ref 3.4–10.8)

## 2021-10-15 LAB — BASIC METABOLIC PANEL
BUN/Creatinine Ratio: 25 (ref 12–28)
BUN: 22 mg/dL (ref 8–27)
CO2: 17 mmol/L — ABNORMAL LOW (ref 20–29)
Calcium: 9.6 mg/dL (ref 8.7–10.3)
Chloride: 105 mmol/L (ref 96–106)
Creatinine, Ser: 0.89 mg/dL (ref 0.57–1.00)
Glucose: 84 mg/dL (ref 70–99)
Potassium: 4.9 mmol/L (ref 3.5–5.2)
Sodium: 141 mmol/L (ref 134–144)
eGFR: 68 mL/min/{1.73_m2} (ref >59)

## 2021-10-15 NOTE — Telephone Encounter (Addendum)
Called patient with lab results, no answer, left results on VM with call back number for questions!      ----- Message from Joylene Grapes, NP sent at 10/15/2021 10:21 AM EST ----- Recent labs show stable blood counts (hemoglobin back to baseline), stable kidney function and electrolytes. Continue current medications and follow-up as planned. Thank you!

## 2021-10-15 NOTE — Progress Notes (Signed)
HPI: Patient returns for routine postoperative follow-up having undergone coronary bypass graft surgery x4 on 09/08/2021. The patient's early postoperative recovery while in the hospital was notable for an uncomplicated postoperative course. Since hospital discharge the patient reports that she was feeling fine after discharge and then suddenly developed fatigue, nausea, and poor appetite.  She had no chest pain or shortness of breath.  She presented back to the emergency room and her labs were fairly unremarkable with a white blood cell count of 8.5 and hemoglobin of 9.3.  High-sensitivity troponin was 420 and 436.  Chest x-ray showed improved aeration and decreased left pleural effusion.  She had a right upper quadrant ultrasound that was negative.  Cardiology was consulted and did not feel this was consistent with acute coronary syndrome.  She had no chest pain and an electrocardiogram showed no ST segment changes.  She has a history of autoimmune hepatitis but her liver function tests were normal on admission.  She also had a history of a loose tooth that had been infected previously and she was on amoxicillin for that.  She was scheduled to have this extracted when she came in the hospital with chest pain prior to her surgery.  This subsequently fell out last week.  She said that since her recent discharge from the hospital she has been feeling well and is walking 2 to 3 miles per day in segments.  She denies any chest pain or shortness of breath.  She said that she was recently seen by her PCP and her Toprol was increased to 25 mg daily and she was started on losartan 25 mg daily for hypertension.   Current Outpatient Medications  Medication Sig Dispense Refill   acetaminophen (TYLENOL) 325 MG tablet Take 2 tablets (650 mg total) by mouth every 6 (six) hours as needed for mild pain.     aspirin EC 325 MG EC tablet Take 1 tablet (325 mg total) by mouth daily. 30 tablet 0   ezetimibe (ZETIA) 10 MG  tablet Take 1 tablet (10 mg total) by mouth daily. 30 tablet 3   Iron Polysacch Cmplx-B12-FA (NIFEREX-150 FORTE PO)      iron polysaccharides (NIFEREX) 150 MG capsule Take 1 capsule (150 mg total) by mouth daily. 30 capsule 0   losartan (COZAAR) 25 MG tablet Take 1 tablet (25 mg total) by mouth daily. 90 tablet 1   metoprolol succinate (TOPROL-XL) 25 MG 24 hr tablet Take 1 tablet (25 mg total) by mouth daily. 30 tablet 3   traMADol (ULTRAM) 50 MG tablet Take 1 tablet (50 mg total) by mouth every 4 (four) hours as needed for moderate pain. 30 tablet 0   No current facility-administered medications for this visit.    Physical Exam: BP (!) 159/111 (BP Location: Right Arm, Patient Position: Sitting)    Pulse 82    Resp 20    Ht 5\' 3"  (1.6 m)    Wt 129 lb (58.5 kg)    SpO2 97% Comment: RA   BMI 22.85 kg/m  She looks well. Cardiac exam shows a regular rate and rhythm with normal heart sounds.  There is no murmur. Lungs are clear. The chest incision is well-healed and the sternum is stable. There is no peripheral edema.  Diagnostic Tests:  Narrative & Impression  CLINICAL DATA:  Post 4 vessel CABG on 09/08/2021, follow-up, former smoker   EXAM: CHEST - 2 VIEW   COMPARISON:  09/19/2021   FINDINGS: Normal heart size post CABG.  Mediastinal contours and pulmonary vascularity normal.   Atherosclerotic calcification aorta.   Lungs hyperinflated with minimal residual atelectasis in lingula and LEFT lower lobe.   Remaining lungs clear.   No pleural effusion or pneumothorax.   Bones demineralized.   IMPRESSION: Minimal residual atelectasis in lingula and LEFT lower lobe.   Improved aeration versus prior exam.   Aortic Atherosclerosis (ICD10-I70.0).     Electronically Signed   By: Ulyses Southward M.D.   On: 10/15/2021 08:39        Impression:  Overall I think she is making good progress following her bypass surgery.  She continues to abstain from smoking and says she has  no interest in it anymore.  I told her she can return to driving a car.  I encouraged her to continue walking as much as possible.  I asked her not to lift anything heavier than 10 pounds for 3 months postoperatively.  Her blood pressure is still elevated today and she will continue to monitor that at home and follow-up with her PCP and cardiology as needed.  Plan:  She will continue to follow-up with her PCP and cardiology and will return to see me if she has any problems with her incisions.   Alleen Borne, MD [facility] [phone]

## 2021-10-15 NOTE — Telephone Encounter (Signed)
° °  Pt's sister calling back, she said they have some few questions:  Pt ran out of her Aspirin 325 [place] EC, they would like to know if pt needs to continue taking that dose or she needs to go back to her lower dose.  Pt also ran out of iron and wanted to know if pt needs to get refill and continue taking it.  Also, when pt met with the pharmacist, they recommended for the pt to switch from zetia to Praluent or Repatha, pt wasn't ready to make a decision and was advised by PharmD that pt can discuss it with Dr. Johney Frame, pt's appt with Dr. Johney Frame is on 11/25/21, they wanted to know if pt needs to continue taking the zetia until her appt with Dr. Johney Frame.  Lastly, pt was seen by Dr. Cyndia Bent today and pt's BP was 159/111, pt was released by Dr. Cyndia Bent and was told the gen cards will be monitoring her meds. Pt's recent BP is 170/112, Dr. Cyndia Bent said, pt's losartan is at low dose, they were instructed to call cards if the dose needs to increase.  Pt's sister said, the nurse can call her back or can call the pt directly, but they requested if nurse will be leaving a voice mail, to speak carefully and slowly so they can understand.

## 2021-10-16 MED ORDER — ASPIRIN 325 MG PO TBEC: 325.0000 mg | DELAYED_RELEASE_TABLET | Freq: Every day | ORAL | 2 refills | Status: DC

## 2021-10-16 NOTE — Addendum Note (Signed)
Addended by: Marlene Lard on: 10/16/2021 09:22 AM   Modules accepted: Orders

## 2021-10-16 NOTE — Telephone Encounter (Signed)
Patient called clinic. She was upset because I had gotten Praluent approved by insurance and she had not decided yet if she wants the medication. I advised that did get it approved since her GI Dr. York Spaniel it was ok for her to take, but that did not mean she had to take it. No prescription has been sent.  She states she doesn't want to take it because her cholesterol is a lot lowe than its ever been. We talked about how even though her cholesterol is lower than before, its still too high and the only way to get it down to levels that will reduce her risk of another heart attack is through medications. She can discuss with Dr. Shari Prows at appointment.  She said her zetia cost $60. I told her not to pick up- I could do a tier exception to get it cheaper for her.  She said she is very upset and her anxiety is really bad, she is having panic attacks again. States dealing with healthcare makes her incredibly anxious and she has had so many appointments lately and no one seems to be on the same page. Was upset about not having refills on her ASA 325. She was also upset because her sister spoke to an RN yesterday at [facility] and they confused her doses of losartan and metoprolol.  She states that her "blood pressure is always high at the doctors, she has white coat and all this checking has make her blood pressure sky high. States it always comes down when she gets home and relaxes and comes down to normal of 150/90. BP is high at home now too because of all these appointments and changes and no one is on the same page and it causes her so much anxiety. She has tried to calm down and she cant." I told patient that I understood why she was upset. Even though her blood pressure comes down at home, those readings are still too high. She got even more upset when I reviewed the change from metoprolol to carvedilol. I did calm he down a little when I explained that they are both beta blockers but that carvedilol would  lower her BP more. She insisted that we talk to Dr. Hyacinth Meeker about it before changing since she has been on for 16 years and Deboraha Sprang was the one who started her on it. I advised that we would be happy to call Dr. Hyacinth Meeker office to discuss.  I tried to call Dr. Hyacinth Meeker office, but they were already closed. I will try tomorrow. I added metoprolol succinate 50mg  daily back to med list for the time being I sent in rx for ASA 325mg  daily

## 2021-10-16 NOTE — Telephone Encounter (Signed)
Please advise, I am happy to call them back

## 2021-10-16 NOTE — Telephone Encounter (Signed)
Per Bernadene Person, NP   "Dr. Laneta Simmers recommended ASA 325 mg x3 months after surgery, after that, she may reduce dose to 81 mg ( on 12/07/2021). We did not prescribe the iron, she can ask her PCP for refill. She needs to continue her Zetia until she sees Dr. Shari Prows. For her BP, I would like for her to continue her Losartan 50 mg dail. I will have her stop metoprolol 25 mg daily and start carvedilol 6.25 bid, check BP/HR daily and report back in 1 week, Thank you! "     Patient says her PCP adjusted her medications. she is taking Losartan 25mg  daily and metoprolol 50mg  daily, which is not what is noted in our files.   Patient was very upset that her aspirin prescription had not been reviewed and that her dosages are marked incorrect in her chart.    Patient would like this information sent back to provider for reevaluation.   Advised patient to continue taking ASA 325mg  until 3/12 and then can reduce to 81mg . Also advised patient to continue taking her Zetia until she see Dr. .    Metop discontinued per order will need to be reordered if plan changes per , NP

## 2021-10-16 NOTE — Addendum Note (Signed)
Addended by: Marcelle Overlie D on: 10/16/2021 05:01 PM   Modules accepted: Orders

## 2021-10-17 NOTE — Telephone Encounter (Signed)
See phone note from 1/18 from [facility] for more details

## 2021-10-17 NOTE — Telephone Encounter (Signed)
Message left for Dr. Hyacinth Meeker of [place]. Tier exception submitted to insurance.

## 2021-10-17 NOTE — Telephone Encounter (Signed)
Jane from Dr. Hyacinth Meeker office called and left me VM that Dr. Hyacinth Meeker is ok with [place] changing her medications. Erskine Squibb direct # is [phone].  Patient had sent me a mychart message, after I had reached out to Dr. Hyacinth Meeker, that she did not want to make any medication changes until she meets with PCP and recovers from her surgery. I did reply to mychart message letting patient know that Dr. Hyacinth Meeker was ok with medication changes. See my-chart message for more details.

## 2021-10-20 ENCOUNTER — Encounter: Payer: Self-pay | Admitting: Pharmacist

## 2021-11-17 NOTE — Progress Notes (Deleted)
Cardiology Office Note:    Date:  11/17/2021   ID:  [name], [name] 06/21/1946, MRN [id number]  PCP:  Kathyrn Lass, MD   [facility] Providers Cardiologist:  Freada Bergeron, MD {   Referring MD: Kathyrn Lass, MD     History of Present Illness:    [name] [name] is a 76 y.o. 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  Patient presented to [facility] hospital on 08/30/2021 with complaints of intermittent chest pain.  She was documented to have elevated troponins (hsT 1217->1359). ECG showed anterolateral ST segment depressions. She ruled in for an NSTEMI. Echo showed normal LV function (EF 70%). Cardiac cath on 09/01/2021 showed severe 3-vessel CAD and she underwent 4-vessel CABG (SVG to LAD, SVG to RCA, SVG to OM and OM 2; LIMA a poor candidate) on 09/08/2021. D/c'd home on ASA, Zetia, and metoprolol succinate. Not placed on a statin due to her hx of hepatitis. Plan was to initiate PCSK9 therapy as an outpatient.  Past Medical History:  Diagnosis Date   Anxiety    Hepatitis     Past Surgical History:  Procedure Laterality Date   CORONARY ARTERY BYPASS GRAFT N/A 09/08/2021   Procedure: CORONARY ARTERY BYPASS GRAFTING (CABG) X 4  USING LEFT INTERNAL MAMMARY ARTERY AND RIGHT ENDOSCOPIC GREATER SAPHENOUS VEIN CONDUITS.;  Surgeon: Gaye Pollack, MD;  Location: [facility];  Service: Open Heart Surgery;  Laterality: N/A;   ENDOVEIN HARVEST OF GREATER SAPHENOUS VEIN Right 09/08/2021   Procedure: ENDOVEIN HARVEST OF GREATER SAPHENOUS VEIN;  Surgeon: Gaye Pollack, MD;  Location: [facility];  Service: Open Heart Surgery;  Laterality: Right;   LEFT HEART CATH AND CORONARY ANGIOGRAPHY N/A 09/01/2021   Procedure: LEFT HEART CATH AND CORONARY ANGIOGRAPHY;  Surgeon: Nelva Bush, MD;  Location: [facility] [facility];  Service: Cardiovascular;  Laterality: N/A;   TEE  WITHOUT CARDIOVERSION N/A 09/08/2021   Procedure: TRANSESOPHAGEAL ECHOCARDIOGRAM (TEE);  Surgeon: Gaye Pollack, MD;  Location: [facility];  Service: Open Heart Surgery;  Laterality: N/A;    Current Medications: No outpatient medications have been marked as taking for the 11/25/21 encounter (Appointment) with Freada Bergeron, MD.     Allergies:   Budesonide   Social History   Socioeconomic History   Marital status: Single    Spouse name: Not on file   Number of children: Not on file   Years of education: Not on file   Highest education level: Not on file  Occupational History   Not on file  Tobacco Use   Smoking status: Former    Packs/day: 1.50    Years: 30.00    Pack years: 45.00    Types: Cigarettes   Smokeless tobacco: Never  Vaping Use   Vaping Use: Never used  Substance and Sexual Activity   Alcohol use: Not Currently   Drug use: Never   Sexual activity: Not Currently  Other Topics Concern   Not on file  Social History Narrative   Not on file   Social Determinants of Health   Financial Resource Strain: Not on file  Food Insecurity: Not on file  Transportation Needs: Not on file  Physical Activity: Not on file  Stress: Not on file  Social Connections: Not on file     Family History: The patient's ***family history includes Premature CHD in her father.  ROS:   Please see the history of present illness.    *** All other systems reviewed and are negative.  EKGs/Labs/Other Studies Reviewed:    The following studies were reviewed today: Carotid US 09/02/2021 Summary:  Right Carotid: Velocities in the right ICA are consistent with a 40-59%                 stenosis.   Left Carotid: Velocities in the left ICA are consistent with a 1-39%  stenosis.  Vertebrals:  Bilateral vertebral arteries demonstrate antegrade flow.  Subclavians: Normal flow hemodynamics were seen in bilateral subclavian               arteries.    TTE 08/31/2021  1. Left ventricular  ejection fraction, by estimation, is 70 to 75%. The  left ventricle has hyperdynamic function. The left ventricle has no  regional wall motion abnormalities. There is moderate concentric left  ventricular hypertrophy. Left ventricular  diastolic parameters are consistent with Grade I diastolic dysfunction  (impaired relaxation).   2. Right ventricular systolic function is normal. The right ventricular  size is normal. Tricuspid regurgitation signal is inadequate for assessing  PA pressure.   3. The mitral valve is normal in structure. Trivial mitral valve  regurgitation.   4. The aortic valve is tricuspid. There is mild calcification of the  aortic valve. There is mild thickening of the aortic valve. Aortic valve  regurgitation is trivial.   5. Aortic dilatation noted. 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   6. The inferior vena cava is normal in size with greater than 50%  respiratory variability, suggesting right atrial pressure of 3 mmHg.   LHC 09/01/2021 Conclusions: Severe three-vessel coronary artery disease, as detailed below. Mildly elevated left ventricular filling pressure.   Recommendations: Cardiac surgery consultation for CABG. Restart IV heparin 2 hours after TR band removal. Start isosorbide mononitrate and wean off nitroglycerin infusion, as tolerated. Aggressive secondary prevention of coronary artery disease.  EKG:  EKG is *** ordered today.  The ekg ordered today demonstrates ***  Recent Labs: 09/09/2021: Magnesium 2.4 09/19/2021: ALT 12; TSH 2.413 10/14/2021: BUN 22; Creatinine, Ser 0.89; Hemoglobin 12.9; Platelets 255; Potassium 4.9; Sodium 141  Recent Lipid Panel    Component Value Date/Time   CHOL 205 (H) 08/31/2021 0052   TRIG 54 08/31/2021 0052   HDL 49 08/31/2021 0052   CHOLHDL 4.2 08/31/2021 0052   VLDL 11 08/31/2021 0052   LDLCALC 145 (H) 08/31/2021 0052     Risk Assessment/Calculations:   {Does this patient have  ATRIAL FIBRILLATION?:[phone]}       Physical Exam:    VS:  There were no vitals taken for this visit.    Wt Readings from Last 3 Encounters:  10/15/21 129 lb (58.5 kg)  10/07/21 130 lb 11.2 oz (59.3 kg)  09/20/21 131 lb 2.8 oz (59.5 kg)     GEN: *** Well nourished, well developed in no acute distress HEENT: Normal NECK: No JVD; No carotid bruits LYMPHATICS: No lymphadenopathy CARDIAC: ***RRR, no murmurs, rubs, gallops RESPIRATORY:  Clear to auscultation without rales, wheezing or rhonchi  ABDOMEN: Soft, non-tender, non-distended MUSCULOSKELETAL:  No edema; No deformity  SKIN: Warm and dry NEUROLOGIC:  Alert and oriented x 3 PSYCHIATRIC:  Normal affect   ASSESSMENT:    No diagnosis found. PLAN:    In order of problems listed above:  #Multivessel CAD s/p CABGx4: Patient admitted with non-STEMI in 08/2021 found to have  three-vessel CAD. Status post CABG x4 on 09/08/2021. LIMA was determined to be poor candidate for revascularization at time of surgery.  She underwent SVG to LAD, SVG to RCA, SVG to OM and OM 2. Pre-op TTE with LVEF 70-75%. -Change ASA to 81mg  daily -Recommend starting plavix 75mg  daily -Continue metop 50mg  XL daily -Continue losartan 25mg  daily -Continue zetia 10mg  daily -Plan for PCSK9i due to history of autoimmune hepatitis    #Carotid artery disease -Right ICA 40-59%, left ICA 1-39%. -Continue ASA as above -Plan to start PCSK9i -Will need annual monitoring   #Hypertension -Continue losartan 25mg  daily -Continue metop 50mg  XL daily   #Autoimmune hepatitis -On azathioprine -Follows at [place]   #Hyperlipidemia No statin due to autoimmune hepatitis.   -Continue zetia 10mg  daily -Will refer for consideration of PCSK9i  {The patient has an active order for outpatient cardiac rehabilitation.   Please indicate if the patient is ready to start. Do NOT delete this.  It will auto delete.  Refresh note, then sign.              Click here to  document readiness and see contraindications.  :1}  Cardiac Rehabilitation Eligibility Assessment       {Are you ordering a CV Procedure (e.g. stress test, cath, DCCV, TEE, etc)?   Press F2        :[id number]    Medication Adjustments/Labs and Tests Ordered: Current medicines are reviewed at length with the patient today.  Concerns regarding medicines are outlined above.  No orders of the defined types were placed in this encounter.  No orders of the defined types were placed in this encounter.   There are no Patient Instructions on file for this visit.   Signed, Freada Bergeron, MD  11/17/2021 7:32 PM    [place]

## 2021-11-25 ENCOUNTER — Telehealth (HOSPITAL_COMMUNITY): Payer: Self-pay | Admitting: *Deleted

## 2021-11-25 ENCOUNTER — Ambulatory Visit: Payer: Medicare HMO | Admitting: Cardiology

## 2021-11-25 ENCOUNTER — Encounter: Payer: Self-pay | Admitting: Cardiology

## 2021-11-25 ENCOUNTER — Other Ambulatory Visit: Payer: Self-pay

## 2021-11-25 VITALS — BP 158/100 | HR 82 | Ht 63.0 in | Wt 131.0 lb

## 2021-11-25 DIAGNOSIS — I2510 Atherosclerotic heart disease of native coronary artery without angina pectoris: Secondary | ICD-10-CM

## 2021-11-25 DIAGNOSIS — Z951 Presence of aortocoronary bypass graft: Secondary | ICD-10-CM

## 2021-11-25 DIAGNOSIS — R69 Illness, unspecified: Secondary | ICD-10-CM | POA: Diagnosis not present

## 2021-11-25 DIAGNOSIS — E785 Hyperlipidemia, unspecified: Secondary | ICD-10-CM

## 2021-11-25 DIAGNOSIS — I10 Essential (primary) hypertension: Secondary | ICD-10-CM | POA: Diagnosis not present

## 2021-11-25 DIAGNOSIS — Z720 Tobacco use: Secondary | ICD-10-CM | POA: Diagnosis not present

## 2021-11-25 DIAGNOSIS — F419 Anxiety disorder, unspecified: Secondary | ICD-10-CM

## 2021-11-25 DIAGNOSIS — I779 Disorder of arteries and arterioles, unspecified: Secondary | ICD-10-CM | POA: Diagnosis not present

## 2021-11-25 MED ORDER — ASPIRIN EC 81 MG PO TBEC: 81.0000 mg | DELAYED_RELEASE_TABLET | Freq: Every day | ORAL | 3 refills | Status: AC

## 2021-11-25 MED ORDER — CLOPIDOGREL BISULFATE 75 MG PO TABS: 75.0000 mg | ORAL_TABLET | Freq: Every day | ORAL | 3 refills | Status: DC

## 2021-11-25 NOTE — Patient Instructions (Signed)
Medication Instructions:   DECREASE YOUR ASPIRIN TO TAKING 81 MG BY MOUTH DAILY ON Monday 12/01/21  START TAKING PLAVIX 75 MG BY MOUTH DAILY ON Monday 12/01/21--YOU WILL REDUCE YOUR ASPIRIN TO 81 MG THAT DAY AS WELL.  *If you need a refill on your cardiac medications before your next appointment, please call your pharmacy*   Follow-Up:  3 MONTHS WITH DR. Shari Prows

## 2021-11-25 NOTE — Progress Notes (Signed)
Cardiology Office Note:    Date:  11/25/2021   ID:  [name] [name], Arab 07-07-1946, MRN [id number]  PCP:  Sigmund Hazel, MD   [facility] Providers Cardiologist:  [name], MD {  Referring MD: Sigmund Hazel, MD    History of Present Illness:    [name] [name] is a 76 y.o. 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  Patient was hospitalized from 08/31/2019 to 09/13/2021 in the setting of exertional chest discomfort/NSTEMI. Cardiac catheterization on 09/01/2021 showed severe three-vessel CAD (dLM 50%, p-mLAD 50%, mLAD-1 90%, mLAD-2 50%, D1 20%, o-pCx 70%, dCx 20%, p-mRCA 90%, dRCA 50%, RPAV 40%). Carotid dopplers showed 40-59% RICA stenosis, 1 to 39% LICA stenosis. Echo showed EF >65%, moderately increased LV wall thickness, moderate concentric LVH. She underwent CABG x4 (SVG-LAD, SVG-RCA, and Sequential SVG-OM1 and OM2) on 09/08/2021. She was started on Zoloft for increased anxiety and depression. She was discharged home in stable condition on 09/13/21 on aspirin, zetia and metoprolol (statin not started due to autoimmune hepatitis) but subsequently returned to the ED on 09/19/2021 in the setting of nausea and fatigue. Troponin was elevated at 420, 436. Cardiology was consulted. EKG showed frequent PVCs, no ST changes. Findings not consistent with ACS. Abdominal ultrasound was normal.  She was given IV fluids in the setting of probable dehydration, acute gastroenteritis, and was discharged home in stable condition patient is to follow-up with GI as an outpatient.  Was seen by Bernadene Person, NP on 10/07/21 where she was doing well. Remained very active with only mild dyspnea. No chest pain. She was also initiated on losartan.   Saw Dr. Laneta Simmers on 10/15/21 where she again was doing well. Recommended for prn follow-up.  Today, the patient states that she is  doing well. In clinic today her blood pressure is 158/100. Generally she attributes this to anxiety and white coat syndrome. She notes it has been as high as 195-200s at other clinics. At home her blood pressure is also high. However, the more she thinks about checking her blood pressure, it becomes elevated by the time she performs the reading. It is difficult for her to accurately record her blood pressure due to her anxiety.   For exercise she has been walking 5 miles a day without any significant symptoms. Generally she does well with following a healthy diet. She avoids salt and fast foods. She cooks her meals at home, which are mostly salmon, chicken, and vegetables.  She successfully quit smoking about 3 months ago.  She declined PSCK9i at first as she wanted to see how she responded to the zetia. We discussed that outcomes would be significantly better on PCSK9i and she is amenable to try it as [place] as it is covered sufficiently by insurance.   She denies any chest pain, shortness of breath, or peripheral edema. No lightheadedness, headaches, syncope, orthopnea, or PND.   Past Medical History:  Diagnosis Date   Anxiety    Hepatitis     Past Surgical History:  Procedure Laterality Date   CORONARY ARTERY BYPASS GRAFT N/A 09/08/2021   Procedure: CORONARY ARTERY BYPASS GRAFTING (CABG) X 4  USING LEFT INTERNAL MAMMARY ARTERY AND RIGHT ENDOSCOPIC GREATER SAPHENOUS VEIN CONDUITS.;  Surgeon: Alleen Borne, MD;  Location: [facility] OR;  Service: Open Heart Surgery;  Laterality: N/A;   ENDOVEIN HARVEST OF GREATER SAPHENOUS VEIN  Right 09/08/2021   Procedure: ENDOVEIN HARVEST OF GREATER SAPHENOUS VEIN;  Surgeon: Gaye Pollack, MD;  Location: [facility];  Service: Open Heart Surgery;  Laterality: Right;   LEFT HEART CATH AND CORONARY ANGIOGRAPHY N/A 09/01/2021   Procedure: LEFT HEART CATH AND CORONARY ANGIOGRAPHY;  Surgeon: Nelva Bush, MD;  Location: [facility];  Service: Cardiovascular;   Laterality: N/A;   TEE WITHOUT CARDIOVERSION N/A 09/08/2021   Procedure: TRANSESOPHAGEAL ECHOCARDIOGRAM (TEE);  Surgeon: Gaye Pollack, MD;  Location: [place] [place];  Service: Open Heart Surgery;  Laterality: N/A;    Current Medications: Current Meds  Medication Sig   acetaminophen (TYLENOL) 325 MG tablet Take 2 tablets (650 mg total) by mouth every 6 (six) hours as needed for mild pain.   aspirin EC 81 MG tablet Take 1 tablet (81 mg total) by mouth daily. Swallow whole.   clopidogrel (PLAVIX) 75 MG tablet Take 1 tablet (75 mg total) by mouth daily.   ezetimibe (ZETIA) 10 MG tablet Take 1 tablet (10 mg total) by mouth daily.   losartan (COZAAR) 25 MG tablet Take 1 tablet (25 mg total) by mouth daily.   metoprolol succinate (TOPROL-XL) 50 MG 24 hr tablet Take 1 tablet (50 mg total) by mouth daily. Take with or immediately following a meal.   traMADol (ULTRAM) 50 MG tablet Take 1 tablet (50 mg total) by mouth every 4 (four) hours as needed for moderate pain.   [DISCONTINUED] aspirin 325 MG EC tablet Take 1 tablet (325 mg total) by mouth daily.     Allergies:   Budesonide   Social History   Socioeconomic History   Marital status: Single    Spouse name: Not on file   Number of children: Not on file   Years of education: Not on file   Highest education level: Not on file  Occupational History   Not on file  Tobacco Use   Smoking status: Former    Packs/day: 1.50    Years: 30.00    Pack years: 45.00    Types: Cigarettes   Smokeless tobacco: Never  Vaping Use   Vaping Use: Never used  Substance and Sexual Activity   Alcohol use: Not Currently   Drug use: Never   Sexual activity: Not Currently  Other Topics Concern   Not on file  Social History Narrative   Not on file   Social Determinants of Health   Financial Resource Strain: Not on file  Food Insecurity: Not on file  Transportation Needs: Not on file  Physical Activity: Not on file  Stress: Not on file  Social Connections:  Not on file     Family History: The patient's family history includes Premature CHD in her father.  ROS:   Review of Systems  Constitutional:  Negative for fever and malaise/fatigue.  HENT:  Negative for ear discharge and nosebleeds.   Eyes:  Negative for photophobia and redness.  Respiratory:  Negative for cough and shortness of breath.   Cardiovascular:  Positive for palpitations. Negative for chest pain, orthopnea, claudication, leg swelling and PND.  Gastrointestinal:  Negative for diarrhea and melena.  Genitourinary:  Negative for flank pain and hematuria.  Musculoskeletal:  Negative for back pain and neck pain.  Neurological:  Negative for speech change and seizures.  Endo/Heme/Allergies:  Does not bruise/bleed easily.  Psychiatric/Behavioral:  Negative for depression and suicidal ideas. The patient is nervous/anxious.     EKGs/Labs/Other Studies Reviewed:    The following studies were reviewed today:  Intraoperative TEE 99991111: Complications: No known complications during this procedure.  POST-OP IMPRESSIONS  Overall, there were no significant changes from pre-bypass.  _ Left Ventricle: The left ventricle is unchanged from pre-bypass.  _ Right Ventricle: The right ventricle appears unchanged from pre-bypass.  _ Aorta: The aorta appears unchanged from pre-bypass.  _ Left Atrium: The left atrium appears unchanged from pre-bypass.  _ Left Atrial Appendage: The left atrial appendage appears unchanged from pre-bypass.  _ Aortic Valve: The aortic valve appears unchanged from pre-bypass.  _ Mitral Valve: The mitral valve appears unchanged from pre-bypass.  _ Tricuspid Valve: The tricuspid valve appears unchanged from pre-bypass.  _ Pulmonic Valve: The pulmonic valve appears unchanged from pre-bypass.  _ Interatrial Septum: The interatrial septum appears unchanged from  pre-bypass.  _ Interventricular Septum: The interventricular septum appears unchanged from pre-bypass.   _ Pericardium: The pericardium appears unchanged from pre-bypass.   Carotid US 09/02/2021 Summary:  Right Carotid: Velocities in the right ICA are consistent with a 40-59%                 stenosis.   Left Carotid: Velocities in the left ICA are consistent with a 1-39%  stenosis.  Vertebrals:  Bilateral vertebral arteries demonstrate antegrade flow.  Subclavians: Normal flow hemodynamics were seen in bilateral subclavian arteries.    TTE 08/31/2021  1. Left ventricular ejection fraction, by estimation, is 70 to 75%. The  left ventricle has hyperdynamic function. The left ventricle has no  regional wall motion abnormalities. There is moderate concentric left  ventricular hypertrophy. Left ventricular  diastolic parameters are consistent with Grade I diastolic dysfunction  (impaired relaxation).   2. Right ventricular systolic function is normal. The right ventricular  size is normal. Tricuspid regurgitation signal is inadequate for assessing  PA pressure.   3. The mitral valve is normal in structure. Trivial mitral valve  regurgitation.   4. The aortic valve is tricuspid. There is mild calcification of the  aortic valve. There is mild thickening of the aortic valve. Aortic valve  regurgitation is trivial.   5. Aortic dilatation noted. 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   6. The inferior vena cava is normal in size with greater than 50%  respiratory variability, suggesting right atrial pressure of 3 mmHg.   LHC 09/01/2021 Conclusions: Severe three-vessel coronary artery disease, as detailed below. Mildly elevated left ventricular filling pressure.   Recommendations: Cardiac surgery consultation for CABG. Restart IV heparin 2 hours after TR band removal. Start isosorbide mononitrate and wean off nitroglycerin infusion, as tolerated. Aggressive secondary prevention of coronary artery disease.  EKG:  EKG is personally reviewed. 11/25/2021: EKG was  not ordered. 10/07/2021 Diona Browner, NP): NSR, 75 bpm, low voltage, non-specific ST changes post CABG - no acute changes.  Recent Labs: 09/09/2021: Magnesium 2.4 09/19/2021: ALT 12; TSH 2.413 10/14/2021: BUN 22; Creatinine, Ser 0.89; Hemoglobin 12.9; Platelets 255; Potassium 4.9; Sodium 141   Recent Lipid Panel    Component Value Date/Time   CHOL 205 (H) 08/31/2021 0052   TRIG 54 08/31/2021 0052   HDL 49 08/31/2021 0052   CHOLHDL 4.2 08/31/2021 0052   VLDL 11 08/31/2021 0052   LDLCALC 145 (H) 08/31/2021 0052     Risk Assessment/Calculations:           Physical Exam:    VS:  BP (!) 158/100    Pulse 82    Ht 5\' 3"  (1.6 m)    Wt 131  lb (59.4 kg)    SpO2 95%    BMI 23.21 kg/m     Wt Readings from Last 3 Encounters:  11/25/21 131 lb (59.4 kg)  10/15/21 129 lb (58.5 kg)  10/07/21 130 lb 11.2 oz (59.3 kg)     GEN: Well nourished, well developed in no acute distress HEENT: Normal NECK: No JVD; No carotid bruits CARDIAC: RRR, no murmurs, rubs, gallops. CABG scar well-healed. RESPIRATORY:  Clear to auscultation without rales, wheezing or rhonchi  ABDOMEN: Soft, non-tender, non-distended MUSCULOSKELETAL:  No edema; No deformity  SKIN: Warm and dry NEUROLOGIC:  Alert and oriented x 3 PSYCHIATRIC:  Normal affect   ASSESSMENT:    1. Hx of CABG   2. Mild carotid artery disease ([place])   3. Essential hypertension   4. Hyperlipidemia, unspecified hyperlipidemia type   5. Tobacco abuse   6. Coronary artery disease involving native coronary artery of native heart without angina pectoris   7. Anxiety    PLAN:    In order of problems listed above:  #Multivessel CAD s/p CABGx4: #History of NSTEMI: Patient admitted with non-STEMI in 08/2021 found to have three-vessel CAD. Status post CABG x4 on 09/08/2021. LIMA was determined to be poor candidate for revascularization at time of surgery.  She underwent SVG to LAD, SVG to RCA, SVG to OM and OM 2. Pre-op TTE with LVEF  70-75%. -Change ASA to 81mg  daily at 3 months post-surgery -Will start plavix 75mg  daily when transitions to ASA 81mg  given history of NSTEMI with plans to continue for 1 year -Continue metop 50mg  XL daily -Continue losartan 25mg  daily -Continue zetia 10mg  daily -Amenable to start PCSK9i -Cardiac Rehab   #Carotid artery disease -Right ICA 40-59%, left ICA 1-39%. -Continue ASA as above -Plan to start PCSK9i as above -Will need annual monitoring   #Hypertension Difficult to monitor due to significant anxiety with taking blood pressure and white coat hypertension. Discussed how treating her anxiety may help symptoms and improve blood pressure readings. Will follow-up with PCP to discuss further. -Continue losartan 25mg  daily -Continue metop 50mg  XL daily -Management of anxiety per PCP   #Autoimmune hepatitis -On azathioprine -Follows at [facility]   #Hyperlipidemia No statin due to autoimmune hepatitis.   -Continue zetia 10mg  daily -Will refer for PCSK9i; she is amenable to start it  #History of Tobacco Abuse: -Successfully quit  #Anxiety: Patient admits to significant anxiety. Interested in starting medical therapy and will discuss with PCP further.     Cardiac Rehabilitation Eligibility Assessment  The patient is ready to start cardiac rehabilitation from a cardiac standpoint.        Follow-up:   3 months.  Medication Adjustments/Labs and Tests Ordered: Current medicines are reviewed at length with the patient today.  Concerns regarding medicines are outlined above.   No orders of the defined types were placed in this encounter.  Meds ordered this encounter  Medications   clopidogrel (PLAVIX) 75 MG tablet    Sig: Take 1 tablet (75 mg total) by mouth daily.    Dispense:  90 tablet    Refill:  3    Pt to start taking on Monday 12/01/21.   aspirin EC 81 MG tablet    Sig: Take 1 tablet (81 mg total) by mouth daily. Swallow whole.    Dispense:  90 tablet    Refill:  3    Patient Instructions  Medication Instructions:   DECREASE YOUR ASPIRIN TO TAKING 81 MG BY MOUTH DAILY ON Monday 12/01/21  START TAKING PLAVIX 75 MG BY MOUTH DAILY ON Monday 12/01/21--YOU WILL REDUCE YOUR ASPIRIN TO 73 MG THAT DAY AS WELL.  *If you need a refill on your cardiac medications before your next appointment, please call your pharmacy*   Follow-Up:  3 MONTHS WITH DR. Delmar Landau Stumpf,acting as a scribe for Freada Bergeron, MD.,have documented all relevant documentation on the behalf of Freada Bergeron, MD,as directed by  Freada Bergeron, MD while in the presence of Freada Bergeron, MD.  I, Freada Bergeron, MD, have reviewed all documentation for this visit. The documentation on 11/25/21 for the exam, diagnosis, procedures, and orders are all accurate and complete.   Signed, Freada Bergeron, MD  11/25/2021 11:05 AM    [place]

## 2021-11-25 NOTE — Telephone Encounter (Signed)
Pt completed follow up appt with cardiology -  Dr. Shari Prows.  Pt with continued elevation resting bp too high for Cardiac Rehab.  Consistent elevation .  Pt attributes this to anxiety "white coat syndrome".  Pt is open to the idea of anti-anxiety medications and plans to discuss with PCP.   Pt called and left message. Verifying with pt next follow up is on 3/6. Alanson Aly, BSN Cardiac and Emergency planning/management officer

## 2021-11-28 ENCOUNTER — Telehealth: Payer: Self-pay | Admitting: Pharmacist

## 2021-11-28 NOTE — Telephone Encounter (Signed)
Called pt to discuss Praluent again. PA previously approved. LVM for pt to call back. ?

## 2021-11-28 NOTE — Telephone Encounter (Signed)
-----   Message from Freada Bergeron, MD sent at 11/25/2021  9:36 AM EST ----- ?Can we get her on PCSK9i as she is now amenable to try it.  ? ?Thank you so much!! ? ?-Heather ? ?

## 2021-12-01 DIAGNOSIS — Z951 Presence of aortocoronary bypass graft: Secondary | ICD-10-CM | POA: Diagnosis not present

## 2021-12-01 DIAGNOSIS — E7800 Pure hypercholesterolemia, unspecified: Secondary | ICD-10-CM | POA: Diagnosis not present

## 2021-12-01 DIAGNOSIS — R7301 Impaired fasting glucose: Secondary | ICD-10-CM | POA: Diagnosis not present

## 2021-12-01 DIAGNOSIS — I2510 Atherosclerotic heart disease of native coronary artery without angina pectoris: Secondary | ICD-10-CM | POA: Diagnosis not present

## 2021-12-01 DIAGNOSIS — K754 Autoimmune hepatitis: Secondary | ICD-10-CM | POA: Diagnosis not present

## 2021-12-01 DIAGNOSIS — I10 Essential (primary) hypertension: Secondary | ICD-10-CM | POA: Diagnosis not present

## 2021-12-01 DIAGNOSIS — R69 Illness, unspecified: Secondary | ICD-10-CM | POA: Diagnosis not present

## 2021-12-01 NOTE — Telephone Encounter (Signed)
Spoke with patient about Praluent. She says that Dr. Shari Prows mentioned Repatha and her neighbor takes Repatha. I explained that they are the same drug class with almost idential trial results. Her insurance covers Praluent. Patient states she is going to call insurance tomorrow to see if they cover Repatha and the cost of both of them. She will call me afterwards. ?She states that her PCP started her on zolft today for anxiety. ?She is going to take at night with her losartan and zetia. ? ?She states that she feels her heart pounding sometimes. Thinks its withdrawal from smoking at the times that she would normal smoke. ?

## 2021-12-04 NOTE — Telephone Encounter (Signed)
I called patient in response to her VM that she wanted to set up an apt for me to show her how to use the Praluent. Said she called her insurance and it was $47/month. ?I returned patient phone call who proceeded to tell me the same information we discussed on Tuesday.  ?She then started to become hesitant about starting Praluent. Concerned about side effects. We discussed these. Then she wanted to have her labs checked but she wasn't having labs done until 4/3 at her PCP. I offered to have labs done here earlier. Patient wanted to be shown how to use the Praluent. The earliest I could get her in was 3/31. I offered to call her if these was a cancellation. Patient then decided she just wanted to have her labs done at PCP on 4/3 and then decide afterwards. Did not want to schedule an office visit at this time for after her labs. ?I will call pt after she has her labs done on 4/3. ?

## 2021-12-13 ENCOUNTER — Other Ambulatory Visit: Payer: Self-pay | Admitting: Physician Assistant

## 2021-12-29 DIAGNOSIS — I10 Essential (primary) hypertension: Secondary | ICD-10-CM | POA: Diagnosis not present

## 2021-12-29 DIAGNOSIS — E782 Mixed hyperlipidemia: Secondary | ICD-10-CM | POA: Diagnosis not present

## 2021-12-29 DIAGNOSIS — Z951 Presence of aortocoronary bypass graft: Secondary | ICD-10-CM | POA: Diagnosis not present

## 2021-12-29 DIAGNOSIS — R69 Illness, unspecified: Secondary | ICD-10-CM | POA: Diagnosis not present

## 2021-12-29 DIAGNOSIS — K754 Autoimmune hepatitis: Secondary | ICD-10-CM | POA: Diagnosis not present

## 2021-12-29 DIAGNOSIS — I214 Non-ST elevation (NSTEMI) myocardial infarction: Secondary | ICD-10-CM | POA: Diagnosis not present

## 2021-12-29 DIAGNOSIS — Z79899 Other long term (current) drug therapy: Secondary | ICD-10-CM | POA: Diagnosis not present

## 2022-01-05 ENCOUNTER — Telehealth: Payer: Self-pay | Admitting: Pharmacist

## 2022-01-05 NOTE — Telephone Encounter (Signed)
Called pt to review her lipid results from PCP. LDL-C 109. Spoke with patient about trying Praluent. This was discussed in detail with her in the past as well. Patient states she does not want to start. Says right now her blood pressure is more important than her cholesterol. BP is high, is being managed by PCP.  ?Does not think zoloft is helping. ?

## 2022-01-09 ENCOUNTER — Other Ambulatory Visit: Payer: Self-pay | Admitting: Physician Assistant

## 2022-02-25 NOTE — Progress Notes (Unsigned)
Cardiology Office Note:    Date:  03/02/2022   ID:  [name] [name], [name] [name] Apr 24, 1946, MRN [id number]  PCP:  Kathyrn Lass, MD   [facility] Providers Cardiologist:  Freada Bergeron, MD {  Referring MD: Kathyrn Lass, MD    History of Present Illness:    [name] [name] is a 76 y.o. 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  Patient was hospitalized from 08/31/2019 to 09/13/2021 in the setting of exertional chest discomfort/NSTEMI. Cardiac catheterization on 09/01/2021 showed severe three-vessel CAD (dLM 50%, p-mLAD 50%, mLAD-1 90%, mLAD-2 50%, D1 20%, o-pCx 70%, dCx 20%, p-mRCA 90%, dRCA 50%, RPAV 40%). Carotid dopplers showed 40-59% RICA stenosis, 1 to [id number] LICA stenosis. Echo showed EF >65%, moderately increased LV wall thickness, moderate concentric LVH. She underwent CABG x4 (SVG-LAD, SVG-RCA, and Sequential SVG-OM1 and OM2) on 09/08/2021. She was started on Zoloft for increased anxiety and depression. She was discharged home in stable condition on 09/13/21 on aspirin, zetia and metoprolol (statin not started due to autoimmune hepatitis) but subsequently returned to the ED on 09/19/2021 in the setting of nausea and fatigue. Troponin was elevated at 420, 436. Cardiology was consulted. EKG showed frequent PVCs, no ST changes. Findings not consistent with ACS. Abdominal ultrasound was normal.  She was given IV fluids in the setting of probable dehydration, acute gastroenteritis, and was discharged home in stable condition patient is to follow-up with GI as an outpatient.  Was seen by Diona Browner, NP on 10/07/21 where she was doing well. Remained very active with only mild dyspnea. No chest pain. She was also initiated on losartan.   Saw Dr. Cyndia Bent on 10/15/21 where she again was doing well. Recommended for prn follow-up.  Last seen in clinic 11/25/21 where she  was doing well from a CV standpoint but was struggling with a lot of anxiety. Had remained active and was walking 5 miles a day. She ultimately declined starting PCSk9i after multiple attempts to start it.  Today, the patient is concerned that she gained 3lbs with the zoloft. Blood pressure is always very high when she sees an MD or has at interaction with the healthcare system. She remains unsure what it is running at home as she gets anxious when taking her blood pressure and it is always high. Otherwise, she is doing well. She is walking 77miles per day and feels well with activity. She continues to abstain from smoking. No chest pain, SOB, LE edema, orthopnea or PND. She continues to take the zetia 10mg  daily and does not want to take the repatha/praulent.   Past Medical History:  Diagnosis Date   Anxiety    Hepatitis     Past Surgical History:  Procedure Laterality Date   CORONARY ARTERY BYPASS GRAFT N/A 09/08/2021   Procedure: CORONARY ARTERY BYPASS GRAFTING (CABG) X 4  USING LEFT INTERNAL MAMMARY ARTERY AND RIGHT ENDOSCOPIC GREATER SAPHENOUS VEIN CONDUITS.;  Surgeon: Gaye Pollack, MD;  Location: [place];  Service: Open Heart Surgery;  Laterality: N/A;   ENDOVEIN HARVEST OF GREATER SAPHENOUS VEIN Right 09/08/2021   Procedure: ENDOVEIN HARVEST OF GREATER SAPHENOUS VEIN;  Surgeon: Gaye Pollack, MD;  Location: [place];  Service: Open Heart Surgery;  Laterality: Right;   LEFT HEART CATH AND CORONARY ANGIOGRAPHY N/A 09/01/2021   Procedure: LEFT HEART CATH AND CORONARY ANGIOGRAPHY;  Surgeon: Nelva Bush, MD;  Location: [place] [facility];  Service: Cardiovascular;  Laterality: N/A;   TEE WITHOUT CARDIOVERSION N/A 09/08/2021   Procedure: TRANSESOPHAGEAL ECHOCARDIOGRAM (TEE);  Surgeon: Gaye Pollack, MD;  Location: [place];  Service: Open Heart Surgery;  Laterality: N/A;    Current Medications: Current Meds  Medication Sig   aspirin EC 81 MG tablet Take 1 tablet (81 mg total) by mouth  daily. Swallow whole.   clopidogrel (PLAVIX) 75 MG tablet Take 1 tablet (75 mg total) by mouth daily.   ezetimibe (ZETIA) 10 MG tablet Take 1 tablet (10 mg total) by mouth daily.   losartan (COZAAR) 50 MG tablet Take 1 tablet (50 mg total) by mouth daily.   metoprolol succinate (TOPROL-XL) 50 MG 24 hr tablet Take 75 mg by mouth daily. Take with or immediately following a meal.   sertraline (ZOLOFT) 25 MG tablet Take 1 tablet (25 mg total) by mouth daily.   [DISCONTINUED] losartan (COZAAR) 25 MG tablet Take 1 tablet (25 mg total) by mouth daily.     Allergies:   Budesonide   Social History   Socioeconomic History   Marital status: Single    Spouse name: Not on file   Number of children: Not on file   Years of education: Not on file   Highest education level: Not on file  Occupational History   Not on file  Tobacco Use   Smoking status: Former    Packs/day: 1.50    Years: 30.00    Pack years: 45.00    Types: Cigarettes   Smokeless tobacco: Never  Vaping Use   Vaping Use: Never used  Substance and Sexual Activity   Alcohol use: Not Currently   Drug use: Never   Sexual activity: Not Currently  Other Topics Concern   Not on file  Social History Narrative   Not on file   Social Determinants of Health   Financial Resource Strain: Not on file  Food Insecurity: Not on file  Transportation Needs: Not on file  Physical Activity: Not on file  Stress: Not on file  Social Connections: Not on file     Family History: The patient's family history includes Premature CHD in her father.  ROS:   Review of Systems  Constitutional:  Negative for fever and malaise/fatigue.  HENT:  Negative for nosebleeds.   Respiratory:  Negative for cough and shortness of breath.   Cardiovascular:  Positive for palpitations. Negative for chest pain, orthopnea, claudication, leg swelling and PND.  Gastrointestinal:  Negative for diarrhea and melena.  Genitourinary:  Negative for flank pain and  hematuria.  Musculoskeletal:  Negative for back pain and neck pain.  Endo/Heme/Allergies:  Does not bruise/bleed easily.  Psychiatric/Behavioral:  Negative for depression and suicidal ideas. The patient is nervous/anxious.     EKGs/Labs/Other Studies Reviewed:    The following studies were reviewed today:  Intraoperative TEE 99991111: Complications: No known complications during this procedure.  POST-OP IMPRESSIONS  Overall, there were no significant changes from pre-bypass.  _ Left Ventricle: The left ventricle is unchanged from pre-bypass.  _ Right Ventricle: The right ventricle appears unchanged from pre-bypass.  _ Aorta: The aorta appears unchanged from pre-bypass.  _ Left Atrium: The left atrium appears unchanged from pre-bypass.  _ Left Atrial Appendage: The left atrial appendage appears unchanged from pre-bypass.  _ Aortic Valve: The aortic valve appears unchanged from pre-bypass.  _ Mitral Valve: The mitral valve appears unchanged from pre-bypass.  _ Tricuspid Valve: The tricuspid valve appears unchanged  from pre-bypass.  _ Pulmonic Valve: The pulmonic valve appears unchanged from pre-bypass.  _ Interatrial Septum: The interatrial septum appears unchanged from  pre-bypass.  _ Interventricular Septum: The interventricular septum appears unchanged from pre-bypass.  _ Pericardium: The pericardium appears unchanged from pre-bypass.   Carotid US 09/02/2021 Summary:  Right Carotid: Velocities in the right ICA are consistent with a 40-59%                 stenosis.   Left Carotid: Velocities in the left ICA are consistent with a 1-39%  stenosis.  Vertebrals:  Bilateral vertebral arteries demonstrate antegrade flow.  Subclavians: Normal flow hemodynamics were seen in bilateral subclavian arteries.    TTE 08/31/2021  1. Left ventricular ejection fraction, by estimation, is 70 to 75%. The  left ventricle has hyperdynamic function. The left ventricle has no  regional wall motion  abnormalities. There is moderate concentric left  ventricular hypertrophy. Left ventricular  diastolic parameters are consistent with Grade I diastolic dysfunction  (impaired relaxation).   2. Right ventricular systolic function is normal. The right ventricular  size is normal. Tricuspid regurgitation signal is inadequate for assessing  PA pressure.   3. The mitral valve is normal in structure. Trivial mitral valve  regurgitation.   4. The aortic valve is tricuspid. There is mild calcification of the  aortic valve. There is mild thickening of the aortic valve. Aortic valve  regurgitation is trivial.   5. Aortic dilatation noted. 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   6. The inferior vena cava is normal in size with greater than 50%  respiratory variability, suggesting right atrial pressure of 3 mmHg.   LHC 09/01/2021 Conclusions: Severe three-vessel coronary artery disease, as detailed below. Mildly elevated left ventricular filling pressure.   Recommendations: Cardiac surgery consultation for CABG. Restart IV heparin 2 hours after TR band removal. Start isosorbide mononitrate and wean off nitroglycerin infusion, as tolerated. Aggressive secondary prevention of coronary artery disease.  EKG:  No new ECG today  Recent Labs: 09/09/2021: Magnesium 2.4 09/19/2021: ALT 12; TSH 2.413 10/14/2021: BUN 22; Creatinine, Ser 0.89; Hemoglobin 12.9; Platelets 255; Potassium 4.9; Sodium 141   Recent Lipid Panel    Component Value Date/Time   CHOL 205 (H) 08/31/2021 0052   TRIG 54 08/31/2021 0052   HDL 49 08/31/2021 0052   CHOLHDL 4.2 08/31/2021 0052   VLDL 11 08/31/2021 0052   LDLCALC 145 (H) 08/31/2021 0052     Risk Assessment/Calculations:           Physical Exam:    VS:  BP (!) 208/100   Pulse 82   Ht 5\' 3"  (1.6 m)   Wt 135 lb (61.2 kg)   SpO2 97%   BMI 23.91 kg/m     Wt Readings from Last 3 Encounters:  03/02/22 135 lb (61.2 kg)   11/25/21 131 lb (59.4 kg)  10/15/21 129 lb (58.5 kg)     GEN: Well nourished, well developed in no acute distress HEENT: Normal NECK: No JVD; No carotid bruits CARDIAC: RRR, no murmurs, rubs, gallops. CABG scar well-healed. RESPIRATORY:  Clear to auscultation without rales, wheezing or rhonchi  ABDOMEN: Soft, non-tender, non-distended MUSCULOSKELETAL:  No edema; No deformity  SKIN: Warm and dry NEUROLOGIC:  Alert and oriented x 3 PSYCHIATRIC:  Anxious  ASSESSMENT:    1. Coronary artery disease involving native coronary artery of native heart without angina pectoris   2. Medication management   3. Essential  hypertension   4. Mild carotid artery disease ([place])   5. Tobacco abuse   6. Primary hypertension   7. Hyperlipidemia LDL goal <70   8. Anxiety   9. Hx of CABG     PLAN:    In order of problems listed above:  #Multivessel CAD s/p CABGx4: #History of NSTEMI: Patient admitted with non-STEMI in 08/2021 found to have three-vessel CAD. Status post CABG x4 on 09/08/2021. LIMA was determined to be poor candidate for revascularization at time of surgery.  She underwent SVG to LAD, SVG to RCA, SVG to OM and OM 2. Pre-op TTE with LVEF 70-75%. -Continue ASA 81mg  daily -Continue plavix 75mg  daily for 1 year given NSTEMI (stop 08/2022) -Continue metop 75mg  XL daily -Increase losartan to 50mg  daily -Continue zetia 10mg  daily -Declined PCSK9i after multiple attempts to start -Cardiac Rehab   #Carotid artery disease -Right ICA 40-59%, left ICA 1-39%. -Continue ASA as above -Continue zetia 10mg  daily -Declined PCSK9i as above -Will need annual monitoring with next in 08/2022   #Hypertension Difficult to monitor due to significant anxiety with taking blood pressure and white coat hypertension. Discussed how treating her anxiety may help symptoms and improve blood pressure readings. Will increase losartan for now and continue to monitor. -Continue metop 75mg  XL daily -Increase  losartan to 50mg  daily -BMET next week -Management of anxiety per PCP   #Autoimmune hepatitis -On azathioprine -Follows at [place]   #Hyperlipidemia Not statin due to autoimmune hepatitis.   -Continue zetia 10mg  daily -Declined PCSK9i as detailed above  #History of Tobacco Abuse: -Successfully quit  #Anxiety: Patient admits to significant anxiety which has been difficult to manage. Follows with PCP.       Medication Adjustments/Labs and Tests Ordered: Current medicines are reviewed at length with the patient today.  Concerns regarding medicines are outlined above.   Orders Placed This Encounter  Procedures   Basic metabolic panel   VAS US CAROTID   Meds ordered this encounter  Medications   losartan (COZAAR) 50 MG tablet    Sig: Take 1 tablet (50 mg total) by mouth daily.    Dispense:  90 tablet    Refill:  2    Dose increase   Patient Instructions  Medication Instructions:   INCREASE YOUR LOSARTAN TO 50 MG BY MOUTH DAILY  *If you need a refill on your cardiac medications before your next appointment, please call your pharmacy*   Lab Work:  [place] OFFICE--CHECK BMET  If you have labs (blood work) drawn today and your tests are completely normal, you will receive your results only by: [place] (if you have MyChart) OR A paper copy in the mail If you have any lab test that is abnormal or we need to change your treatment, we will call you to review the results.   Testing/Procedures:  Your physician has requested that you have a carotid duplex. This test is an ultrasound of the carotid arteries in your neck. It looks at blood flow through these arteries that supply the brain with blood. Allow one hour for this exam. There are no restrictions or special instructions.  SCHEDULE CAROTIDS TO BE DONE IN December 2023 PER DR. Johney Frame    Follow-Up:  IN December WITH DR. Terrace Arabia CAROTIDS PRIOR TO IN December AS WELL.    Important  Information About Sugar          Signed, Freada Bergeron, MD  03/02/2022 9:51 AM    [facility]

## 2022-03-02 ENCOUNTER — Ambulatory Visit: Payer: Medicare HMO | Admitting: Cardiology

## 2022-03-02 ENCOUNTER — Encounter: Payer: Self-pay | Admitting: Cardiology

## 2022-03-02 VITALS — BP 208/100 | HR 82 | Ht 63.0 in | Wt 135.0 lb

## 2022-03-02 DIAGNOSIS — Z79899 Other long term (current) drug therapy: Secondary | ICD-10-CM | POA: Diagnosis not present

## 2022-03-02 DIAGNOSIS — Z951 Presence of aortocoronary bypass graft: Secondary | ICD-10-CM

## 2022-03-02 DIAGNOSIS — I779 Disorder of arteries and arterioles, unspecified: Secondary | ICD-10-CM

## 2022-03-02 DIAGNOSIS — R69 Illness, unspecified: Secondary | ICD-10-CM | POA: Diagnosis not present

## 2022-03-02 DIAGNOSIS — Z720 Tobacco use: Secondary | ICD-10-CM

## 2022-03-02 DIAGNOSIS — I2510 Atherosclerotic heart disease of native coronary artery without angina pectoris: Secondary | ICD-10-CM

## 2022-03-02 DIAGNOSIS — E785 Hyperlipidemia, unspecified: Secondary | ICD-10-CM

## 2022-03-02 DIAGNOSIS — F419 Anxiety disorder, unspecified: Secondary | ICD-10-CM

## 2022-03-02 DIAGNOSIS — I10 Essential (primary) hypertension: Secondary | ICD-10-CM | POA: Diagnosis not present

## 2022-03-02 MED ORDER — LOSARTAN POTASSIUM 50 MG PO TABS: 50.0000 mg | ORAL_TABLET | Freq: Every day | ORAL | 2 refills | Status: DC

## 2022-03-02 NOTE — Patient Instructions (Signed)
Medication Instructions:   INCREASE YOUR LOSARTAN TO 50 MG BY MOUTH DAILY  *If you need a refill on your cardiac medications before your next appointment, please call your pharmacy*   Lab Work:  IN ONE WEEK HERE IN THE OFFICE--CHECK BMET  If you have labs (blood work) drawn today and your tests are completely normal, you will receive your results only by: MyChart Message (if you have MyChart) OR A paper copy in the mail If you have any lab test that is abnormal or we need to change your treatment, we will call you to review the results.   Testing/Procedures:  Your physician has requested that you have a carotid duplex. This test is an ultrasound of the carotid arteries in your neck. It looks at blood flow through these arteries that supply the brain with blood. Allow one hour for this exam. There are no restrictions or special instructions.  SCHEDULE CAROTIDS TO BE DONE IN December 2023 PER DR. Shari Prows    Follow-Up:  IN December WITH DR. Sherren Kerns CAROTIDS PRIOR TO IN December AS WELL.    Important Information About Sugar

## 2022-03-09 ENCOUNTER — Other Ambulatory Visit: Payer: Medicare HMO

## 2022-03-09 DIAGNOSIS — I10 Essential (primary) hypertension: Secondary | ICD-10-CM

## 2022-03-09 DIAGNOSIS — Z79899 Other long term (current) drug therapy: Secondary | ICD-10-CM | POA: Diagnosis not present

## 2022-03-09 LAB — BASIC METABOLIC PANEL
BUN/Creatinine Ratio: 37 — ABNORMAL HIGH (ref 12–28)
BUN: 32 mg/dL — ABNORMAL HIGH (ref 8–27)
CO2: 22 mmol/L (ref 20–29)
Calcium: 9.4 mg/dL (ref 8.7–10.3)
Chloride: 100 mmol/L (ref 96–106)
Creatinine, Ser: 0.87 mg/dL (ref 0.57–1.00)
Glucose: 87 mg/dL (ref 70–99)
Potassium: 4.4 mmol/L (ref 3.5–5.2)
Sodium: 135 mmol/L (ref 134–144)
eGFR: 70 mL/min/{1.73_m2} (ref >59)

## 2022-03-11 ENCOUNTER — Telehealth: Payer: Self-pay | Admitting: Cardiology

## 2022-03-11 NOTE — Telephone Encounter (Signed)
The patient has been notified of the result and verbalized understanding.  All questions (if any) were answered. Loa Socks, LPN 7/89/3810 17:51 AM

## 2022-03-11 NOTE — Telephone Encounter (Signed)
Patient called back to receive her results

## 2022-03-11 NOTE — Telephone Encounter (Signed)
-----   Message from [name], MD sent at 03/11/2022  9:25 AM EDT ----- Kidney function and electrolytes look great!

## 2022-03-18 DIAGNOSIS — Z0000 Encounter for general adult medical examination without abnormal findings: Secondary | ICD-10-CM | POA: Diagnosis not present

## 2022-03-19 ENCOUNTER — Telehealth: Payer: Self-pay | Admitting: Cardiology

## 2022-03-19 NOTE — Telephone Encounter (Signed)
Efraim Kaufmann, RN at [place] at [facility] called. She wanted to leave more of an FYI for Dr. Shari Prows about this patient. She was seen yesterday at [facility] and her BP was 186/95 at first then after waiting it was re-checked it was 187/101 Patient felt fine and did not have any complaints and [facility] let the patient go home.  They do not need a call back. They are just aware that Dr. Shari Prows is monitoring her BP

## 2022-03-24 DIAGNOSIS — Z951 Presence of aortocoronary bypass graft: Secondary | ICD-10-CM | POA: Diagnosis not present

## 2022-03-24 DIAGNOSIS — F411 Generalized anxiety disorder: Secondary | ICD-10-CM | POA: Diagnosis not present

## 2022-03-24 DIAGNOSIS — E782 Mixed hyperlipidemia: Secondary | ICD-10-CM | POA: Diagnosis not present

## 2022-03-24 DIAGNOSIS — Z87891 Personal history of nicotine dependence: Secondary | ICD-10-CM | POA: Diagnosis not present

## 2022-03-24 DIAGNOSIS — R69 Illness, unspecified: Secondary | ICD-10-CM | POA: Diagnosis not present

## 2022-03-24 DIAGNOSIS — I10 Essential (primary) hypertension: Secondary | ICD-10-CM | POA: Diagnosis not present

## 2022-03-30 ENCOUNTER — Other Ambulatory Visit (HOSPITAL_BASED_OUTPATIENT_CLINIC_OR_DEPARTMENT_OTHER): Payer: Self-pay | Admitting: Nurse Practitioner

## 2022-03-30 DIAGNOSIS — I10 Essential (primary) hypertension: Secondary | ICD-10-CM

## 2022-06-30 ENCOUNTER — Telehealth: Payer: Self-pay

## 2022-06-30 NOTE — Patient Outreach (Signed)
  Care Coordination   06/30/2022 Name: Naava Janeway MRN: [id number] DOB: 1945-11-05   Care Coordination Outreach Attempts:  An unsuccessful telephone outreach was attempted today to offer the patient information about available care coordination services as a benefit of their health plan.   Follow Up Plan:  Additional outreach attempts will be made to offer the patient care coordination information and services.   Encounter Outcome:  No Answer  Care Coordination Interventions Activated:  No   Care Coordination Interventions:  No, not indicated    [facility] [phone]

## 2022-07-09 ENCOUNTER — Telehealth: Payer: Self-pay

## 2022-07-09 NOTE — Patient Outreach (Signed)
  Care Coordination   07/09/2022 Name: [name] [name] MRN: [id number] DOB: 04/08/1946   Care Coordination Outreach Attempts:  A second unsuccessful outreach was attempted today to offer the patient with information about available care coordination services as a benefit of their health plan.     Follow Up Plan:  Additional outreach attempts will be made to offer the patient care coordination information and services.   Encounter Outcome:  No Answer  Care Coordination Interventions Activated:  No   Care Coordination Interventions:  No, not indicated    [place] [facility] [phone]

## 2022-07-16 DIAGNOSIS — N898 Other specified noninflammatory disorders of vagina: Secondary | ICD-10-CM | POA: Diagnosis not present

## 2022-07-30 DIAGNOSIS — I10 Essential (primary) hypertension: Secondary | ICD-10-CM | POA: Diagnosis not present

## 2022-08-04 DIAGNOSIS — R69 Illness, unspecified: Secondary | ICD-10-CM | POA: Diagnosis not present

## 2022-08-04 DIAGNOSIS — Z6825 Body mass index (BMI) 25.0-25.9, adult: Secondary | ICD-10-CM | POA: Diagnosis not present

## 2022-08-04 DIAGNOSIS — I10 Essential (primary) hypertension: Secondary | ICD-10-CM | POA: Diagnosis not present

## 2022-08-28 ENCOUNTER — Ambulatory Visit (HOSPITAL_COMMUNITY)
Admission: RE | Admit: 2022-08-28 | Discharge: 2022-08-28 | Disposition: A | Discharge status: Home / Self Care | Payer: Medicare HMO | Source: Ambulatory Visit | Attending: Cardiovascular Disease | Admitting: Cardiovascular Disease

## 2022-08-28 DIAGNOSIS — I2510 Atherosclerotic heart disease of native coronary artery without angina pectoris: Secondary | ICD-10-CM | POA: Insufficient documentation

## 2022-08-28 DIAGNOSIS — I779 Disorder of arteries and arterioles, unspecified: Secondary | ICD-10-CM | POA: Diagnosis present

## 2022-08-28 DIAGNOSIS — I252 Old myocardial infarction: Secondary | ICD-10-CM | POA: Diagnosis not present

## 2022-08-28 DIAGNOSIS — E785 Hyperlipidemia, unspecified: Secondary | ICD-10-CM | POA: Diagnosis not present

## 2022-08-28 DIAGNOSIS — I6523 Occlusion and stenosis of bilateral carotid arteries: Secondary | ICD-10-CM | POA: Insufficient documentation

## 2022-08-31 ENCOUNTER — Telehealth: Payer: Self-pay | Admitting: *Deleted

## 2022-08-31 DIAGNOSIS — I779 Disorder of arteries and arterioles, unspecified: Secondary | ICD-10-CM

## 2022-08-31 NOTE — Telephone Encounter (Signed)
The patient has been notified of the result and verbalized understanding.  All questions (if any) were answered.  Pt aware she will need repeat carotids in one year for surveillance.   Pt aware I will go ahead and place the order for repeat carotids in the system, and send a message to our PV Scheduler to call her back and arrange this appt for that time.   Pt verbalized understanding and agrees with this plan.

## 2022-08-31 NOTE — Telephone Encounter (Signed)
-----   Message from [name], MD sent at 08/31/2022  8:32 AM EST ----- Her right carotid shows moderate narrowing and her left shows mild narrowing. This is stable from prior. Will continue current medications and annual monitoring.

## 2022-09-02 NOTE — Progress Notes (Deleted)
Cardiology Office Note:    Date:  09/04/2022   ID:  [name], [name] 10-May-1946, MRN [id number]  PCP:  Sigmund Hazel, MD   [facility] Providers Cardiologist:  [name], MD {  Referring MD: Sigmund Hazel, MD    History of Present Illness:    [name] [name] is a 76 y.o. 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  Patient was hospitalized from 08/31/2019 to 09/13/2021 in the setting of exertional chest discomfort/NSTEMI. Cardiac catheterization on 09/01/2021 showed severe three-vessel CAD (dLM 50%, p-mLAD 50%, mLAD-1 90%, mLAD-2 50%, D1 20%, o-pCx 70%, dCx 20%, p-mRCA 90%, dRCA 50%, RPAV 40%). Carotid dopplers showed 40-59% RICA stenosis, 1 to 39% LICA stenosis. Echo showed EF >65%, moderately increased LV wall thickness, moderate concentric LVH. She underwent CABG x4 (SVG-LAD, SVG-RCA, and Sequential SVG-OM1 and OM2) on 09/08/2021. She was started on Zoloft for increased anxiety and depression. She was discharged home in stable condition on 09/13/21 on aspirin, zetia and metoprolol (statin not started due to autoimmune hepatitis) but subsequently returned to the ED on 09/19/2021 in the setting of nausea and fatigue. Troponin was elevated at 420, 436. Cardiology was consulted. EKG showed frequent PVCs, no ST changes. Findings not consistent with ACS. Abdominal ultrasound was normal.  She was given IV fluids in the setting of probable dehydration, acute gastroenteritis, and was discharged home in stable condition patient is to follow-up with GI as an outpatient.  Was seen by Bernadene Person, NP on 10/07/21 where she was doing well. Remained very active with only mild dyspnea. No chest pain. She was also initiated on losartan.   Saw Dr. Laneta Simmers on 10/15/21 where she again was doing well. Recommended for prn follow-up.  Seen in clinic 11/25/21 where she was  doing well from a CV standpoint but was struggling with a lot of anxiety. Had remained active and was walking 5 miles a day. She ultimately declined starting PCSk9i after multiple attempts to start it.  Was last seen in clinic on 02/2022 where she continued to suffer with significant anxiety but was doing well from a CV standpoint. Remained very active. Declined starting any other lipid lowering therapy.  Today, ***    Past Medical History:  Diagnosis Date   Anxiety    Hepatitis     Past Surgical History:  Procedure Laterality Date   CORONARY ARTERY BYPASS GRAFT N/A 09/08/2021   Procedure: CORONARY ARTERY BYPASS GRAFTING (CABG) X 4  USING LEFT INTERNAL MAMMARY ARTERY AND RIGHT ENDOSCOPIC GREATER SAPHENOUS VEIN CONDUITS.;  Surgeon: Alleen Borne, MD;  Location: [facility] OR;  Service: Open Heart Surgery;  Laterality: N/A;   ENDOVEIN HARVEST OF GREATER SAPHENOUS VEIN Right 09/08/2021   Procedure: ENDOVEIN HARVEST OF GREATER SAPHENOUS VEIN;  Surgeon: Alleen Borne, MD;  Location: [facility] OR;  Service: Open Heart Surgery;  Laterality: Right;   LEFT HEART CATH AND CORONARY ANGIOGRAPHY N/A 09/01/2021   Procedure: LEFT HEART CATH AND CORONARY ANGIOGRAPHY;  Surgeon: Yvonne Kendall, MD;  Location: [facility] INVASIVE CV LAB;  Service: Cardiovascular;  Laterality: N/A;   TEE WITHOUT CARDIOVERSION N/A 09/08/2021   Procedure: TRANSESOPHAGEAL ECHOCARDIOGRAM (TEE);  Surgeon: Alleen Borne, MD;  Location: [facility] OR;  Service: Open Heart Surgery;  Laterality: N/A;    Current Medications: Current Meds  Medication Sig   acetaminophen (TYLENOL) 325 MG tablet Take 2 tablets (650 mg  total) by mouth every 6 (six) hours as needed for mild pain.   aspirin EC 81 MG tablet Take 1 tablet (81 mg total) by mouth daily. Swallow whole.   clopidogrel (PLAVIX) 75 MG tablet Take 1 tablet (75 mg total) by mouth daily.   ezetimibe (ZETIA) 10 MG tablet Take 1 tablet (10 mg total) by mouth daily.   losartan (COZAAR) 50 MG tablet Take 1  tablet (50 mg total) by mouth daily.   metoprolol succinate (TOPROL-XL) 50 MG 24 hr tablet Take 75 mg by mouth daily. Take with or immediately following a meal.     Allergies:   Budesonide   Social History   Socioeconomic History   Marital status: Single    Spouse name: Not on file   Number of children: Not on file   Years of education: Not on file   Highest education level: Not on file  Occupational History   Not on file  Tobacco Use   Smoking status: Former    Packs/day: 1.50    Years: 30.00    Total pack years: 45.00    Types: Cigarettes   Smokeless tobacco: Never  Vaping Use   Vaping Use: Never used  Substance and Sexual Activity   Alcohol use: Not Currently   Drug use: Never   Sexual activity: Not Currently  Other Topics Concern   Not on file  Social History Narrative   Not on file   Social Determinants of Health   Financial Resource Strain: Not on file  Food Insecurity: Not on file  Transportation Needs: Not on file  Physical Activity: Not on file  Stress: Not on file  Social Connections: Not on file     Family History: The patient's family history includes Premature CHD in her father.  ROS:   Review of Systems  Constitutional:  Negative for fever and malaise/fatigue.  HENT:  Negative for nosebleeds.   Respiratory:  Negative for cough and shortness of breath.   Cardiovascular:  Positive for palpitations. Negative for chest pain, orthopnea, claudication, leg swelling and PND.  Gastrointestinal:  Negative for diarrhea and melena.  Genitourinary:  Negative for flank pain and hematuria.  Musculoskeletal:  Negative for back pain and neck pain.  Endo/Heme/Allergies:  Does not bruise/bleed easily.  Psychiatric/Behavioral:  Negative for depression and suicidal ideas. The patient is nervous/anxious.      EKGs/Labs/Other Studies Reviewed:    The following studies were reviewed today:  Intraoperative TEE 09/08/2021: Complications: No known complications  during this procedure.  POST-OP IMPRESSIONS  Overall, there were no significant changes from pre-bypass.  _ Left Ventricle: The left ventricle is unchanged from pre-bypass.  _ Right Ventricle: The right ventricle appears unchanged from pre-bypass.  _ Aorta: The aorta appears unchanged from pre-bypass.  _ Left Atrium: The left atrium appears unchanged from pre-bypass.  _ Left Atrial Appendage: The left atrial appendage appears unchanged from pre-bypass.  _ Aortic Valve: The aortic valve appears unchanged from pre-bypass.  _ Mitral Valve: The mitral valve appears unchanged from pre-bypass.  _ Tricuspid Valve: The tricuspid valve appears unchanged from pre-bypass.  _ Pulmonic Valve: The pulmonic valve appears unchanged from pre-bypass.  _ Interatrial Septum: The interatrial septum appears unchanged from  pre-bypass.  _ Interventricular Septum: The interventricular septum appears unchanged from pre-bypass.  _ Pericardium: The pericardium appears unchanged from pre-bypass.   Carotid US 09/02/2021 Summary:  Right Carotid: Velocities in the right ICA are consistent with a 40-59%  stenosis.   Left Carotid: Velocities in the left ICA are consistent with a 1-39%  stenosis.  Vertebrals:  Bilateral vertebral arteries demonstrate antegrade flow.  Subclavians: Normal flow hemodynamics were seen in bilateral subclavian arteries.    TTE 08/31/2021  1. Left ventricular ejection fraction, by estimation, is 70 to 75%. The  left ventricle has hyperdynamic function. The left ventricle has no  regional wall motion abnormalities. There is moderate concentric left  ventricular hypertrophy. Left ventricular  diastolic parameters are consistent with Grade I diastolic dysfunction  (impaired relaxation).   2. Right ventricular systolic function is normal. The right ventricular  size is normal. Tricuspid regurgitation signal is inadequate for assessing  PA pressure.   3. The mitral valve is  normal in structure. Trivial mitral valve  regurgitation.   4. The aortic valve is tricuspid. There is mild calcification of the  aortic valve. There is mild thickening of the aortic valve. Aortic valve  regurgitation is trivial.   5. Aortic dilatation noted. 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   6. The inferior vena cava is normal in size with greater than 50%  respiratory variability, suggesting right atrial pressure of 3 mmHg.   LHC 09/01/2021 Conclusions: Severe three-vessel coronary artery disease, as detailed below. Mildly elevated left ventricular filling pressure.   Recommendations: Cardiac surgery consultation for CABG. Restart IV heparin 2 hours after TR band removal. Start isosorbide mononitrate and wean off nitroglycerin infusion, as tolerated. Aggressive secondary prevention of coronary artery disease.  EKG:  No new ECG today  Recent Labs: 09/09/2021: Magnesium 2.4 09/19/2021: ALT 12; TSH 2.413 10/14/2021: Hemoglobin 12.9; Platelets 255 03/09/2022: BUN 32; Creatinine, Ser 0.87; Potassium 4.4; Sodium 135   Recent Lipid Panel    Component Value Date/Time   CHOL 205 (H) 08/31/2021 0052   TRIG 54 08/31/2021 0052   HDL 49 08/31/2021 0052   CHOLHDL 4.2 08/31/2021 0052   VLDL 11 08/31/2021 0052   LDLCALC 145 (H) 08/31/2021 0052     Risk Assessment/Calculations:           Physical Exam:    VS:  BP (!) 178/108   Pulse 79   Ht 5\' 3"  (1.6 m)   Wt 143 lb (64.9 kg)   SpO2 96%   BMI 25.33 kg/m     Wt Readings from Last 3 Encounters:  09/04/22 143 lb (64.9 kg)  03/02/22 135 lb (61.2 kg)  11/25/21 131 lb (59.4 kg)     GEN: Well nourished, well developed in no acute distress HEENT: Normal NECK: No JVD; No carotid bruits CARDIAC: RRR, no murmurs, rubs, gallops. CABG scar well-healed. RESPIRATORY:  Clear to auscultation without rales, wheezing or rhonchi  ABDOMEN: Soft, non-tender, non-distended MUSCULOSKELETAL:  No edema; No  deformity  SKIN: Warm and dry NEUROLOGIC:  Alert and oriented x 3 PSYCHIATRIC:  Anxious  ASSESSMENT:    1. Coronary artery disease involving native coronary artery of native heart without angina pectoris   2. Mild carotid artery disease (HCC)   3. Essential hypertension   4. Primary hypertension   5. Hx of CABG   6. Hyperlipidemia LDL goal <70   7. Anxiety     PLAN:    In order of problems listed above:  #Multivessel CAD s/p CABGx4: #History of NSTEMI: Patient admitted with non-STEMI in 08/2021 found to have three-vessel CAD. Status post CABG x4 on 09/08/2021. LIMA was determined to be poor candidate for revascularization at time of surgery.  She underwent SVG to LAD, SVG to RCA, SVG to OM and OM 2. Pre-op TTE with LVEF 70-75%. -Continue ASA 81mg  daily -Continue plavix 75mg  daily for 1 year given NSTEMI (stop 08/2022) -Continue metop 75mg  XL daily -Not tolerating losartan 50mg  daily; prefers losartan 25mg  daily -Continue zetia 10mg  daily -Declined PCSK9i after multiple attempts to start -Cardiac Rehab   #Carotid artery disease -Right ICA 40-59%, left ICA 1-39%. -Continue ASA as above -Continue zetia 10mg  daily -Declined PCSK9i as above -Will need annual monitoring with next in 08/2022   #Hypertension Difficult to monitor due to significant anxiety with taking blood pressure and white coat hypertension. Discussed how treating her anxiety may help symptoms and improve blood pressure readings. Will increase losartan for now and continue to monitor. -Continue metop 75mg  XL daily -Not tolerating losartan 50mg  daily; prefers losartan 25mg  daily -BMET next week -Management of anxiety per PCP   #Autoimmune hepatitis -On azathioprine -Follows at [facility]   #Hyperlipidemia Not statin due to autoimmune hepatitis.   -Continue zetia 10mg  daily -Declined PCSK9i as detailed above  #History of Tobacco Abuse: -Successfully quit  #Anxiety: Patient admits to significant anxiety  which has been difficult to manage. Follows with PCP.       Medication Adjustments/Labs and Tests Ordered: Current medicines are reviewed at length with the patient today.  Concerns regarding medicines are outlined above.   No orders of the defined types were placed in this encounter.  No orders of the defined types were placed in this encounter.  There are no Patient Instructions on file for this visit.     Signed, 09/2022, MD  09/04/2022 9:13 AM    [facility]

## 2022-09-04 ENCOUNTER — Encounter: Payer: Self-pay | Admitting: Cardiology

## 2022-09-04 ENCOUNTER — Ambulatory Visit: Payer: Medicare HMO | Attending: Cardiology | Admitting: Cardiology

## 2022-09-04 VITALS — BP 178/108 | HR 79 | Ht 63.0 in | Wt 143.0 lb

## 2022-09-04 DIAGNOSIS — I10 Essential (primary) hypertension: Secondary | ICD-10-CM

## 2022-09-04 DIAGNOSIS — E785 Hyperlipidemia, unspecified: Secondary | ICD-10-CM | POA: Diagnosis not present

## 2022-09-04 DIAGNOSIS — Z951 Presence of aortocoronary bypass graft: Secondary | ICD-10-CM | POA: Diagnosis not present

## 2022-09-04 DIAGNOSIS — I779 Disorder of arteries and arterioles, unspecified: Secondary | ICD-10-CM

## 2022-09-04 DIAGNOSIS — I2510 Atherosclerotic heart disease of native coronary artery without angina pectoris: Secondary | ICD-10-CM

## 2022-09-04 DIAGNOSIS — F419 Anxiety disorder, unspecified: Secondary | ICD-10-CM

## 2022-09-04 DIAGNOSIS — R69 Illness, unspecified: Secondary | ICD-10-CM | POA: Diagnosis not present

## 2022-09-04 MED ORDER — LOSARTAN POTASSIUM 25 MG PO TABS: 25.0000 mg | ORAL_TABLET | Freq: Every day | ORAL | 2 refills | Status: DC

## 2022-09-04 NOTE — Progress Notes (Signed)
Cardiology Office Note:    Date:  09/04/2022   ID:  [name], [name] 1945-10-03, MRN [id number]  PCP:  Sigmund Hazel, MD   [facility] Providers Cardiologist:  [name], MD {  Referring MD: Sigmund Hazel, MD    History of Present Illness:    [name] [name] is a 76 y.o. 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  Patient was hospitalized from 08/31/2019 to 09/13/2021 in the setting of exertional chest discomfort/NSTEMI. Cardiac catheterization on 09/01/2021 showed severe three-vessel CAD (dLM 50%, p-mLAD 50%, mLAD-1 90%, mLAD-2 50%, D1 20%, o-pCx 70%, dCx 20%, p-mRCA 90%, dRCA 50%, RPAV 40%). Carotid dopplers showed 40-59% RICA stenosis, 1 to 39% LICA stenosis. Echo showed EF >65%, moderately increased LV wall thickness, moderate concentric LVH. She underwent CABG x4 (SVG-LAD, SVG-RCA, and Sequential SVG-OM1 and OM2) on 09/08/2021. She was started on Zoloft for increased anxiety and depression. She was discharged home in stable condition on 09/13/21 on aspirin, zetia and metoprolol (statin not started due to autoimmune hepatitis) but subsequently returned to the ED on 09/19/2021 in the setting of nausea and fatigue. Troponin was elevated at 420, 436. Cardiology was consulted. EKG showed frequent PVCs, no ST changes. Findings not consistent with ACS. Abdominal ultrasound was normal.  She was given IV fluids in the setting of probable dehydration, acute gastroenteritis, and was discharged home in stable condition patient is to follow-up with GI as an outpatient.  Was seen by Bernadene Person, NP on 10/07/21 where she was doing well. Remained very active with only mild dyspnea. No chest pain. She was also initiated on losartan.   Saw Dr. Laneta Simmers on 10/15/21 where she again was doing well. Recommended for prn follow-up.  Seen in clinic 11/25/21 where she was  doing well from a CV standpoint but was struggling with a lot of anxiety. Had remained active and was walking 5 miles a day. She ultimately declined starting PCSk9i after multiple attempts to start it.  Was last seen in clinic on 02/2022 where she continued to suffer with significant anxiety but was doing well from a CV standpoint. Remained very active. Declined starting any other lipid lowering therapy.  Today, the patient states she overall feels okay. She is very worried that her medications are making her feel more fatigued. She wishes to stop the plavix and decrease the losartan as she felt much better on the lower dose. We discussed this at length today and given that it is very difficult to monitor her blood pressure due to severe anxiety, she just wishes to proceed with this route.  Otherwise, she is very stable from a CV standpoint. Remains very active and walks 5-72miles per day. She denies any palpitations, chest pain, shortness of breath, or peripheral edema. No headaches, syncope, orthopnea, or PND.   She has gained 10 pounds since last November, which is very concerning to her. She attributes this to quitting smoking. She is hoping to begin losing the weight and is very dedicated to diet and exercise.    Past Medical History:  Diagnosis Date   Anxiety    Hepatitis     Past Surgical History:  Procedure Laterality Date   CORONARY ARTERY BYPASS GRAFT N/A 09/08/2021   Procedure: CORONARY ARTERY BYPASS GRAFTING (CABG) X 4  USING LEFT INTERNAL MAMMARY ARTERY AND RIGHT ENDOSCOPIC GREATER SAPHENOUS VEIN CONDUITS.;  Surgeon:  Alleen BorneBartle, Bryan K, MD;  [facility];  Service: Open Heart Surgery;  Laterality: N/A;   ENDOVEIN HARVEST OF GREATER SAPHENOUS VEIN Right 09/08/2021   Procedure: ENDOVEIN HARVEST OF GREATER SAPHENOUS VEIN;  Surgeon: Alleen BorneBartle, Bryan K, MD;  Location: [facility] OR;  Service: Open Heart Surgery;  Laterality: Right;   LEFT HEART CATH AND CORONARY ANGIOGRAPHY N/A 09/01/2021    Procedure: LEFT HEART CATH AND CORONARY ANGIOGRAPHY;  Surgeon: Yvonne KendallEnd, Christopher, MD;  Location: [facility] INVASIVE CV LAB;  Service: Cardiovascular;  Laterality: N/A;   TEE WITHOUT CARDIOVERSION N/A 09/08/2021   Procedure: TRANSESOPHAGEAL ECHOCARDIOGRAM (TEE);  Surgeon: Alleen BorneBartle, Bryan K, MD;  [facility];  Service: Open Heart Surgery;  Laterality: N/A;    Current Medications: Current Meds  Medication Sig   acetaminophen (TYLENOL) 325 MG tablet Take 2 tablets (650 mg total) by mouth every 6 (six) hours as needed for mild pain.   aspirin EC 81 MG tablet Take 1 tablet (81 mg total) by mouth daily. Swallow whole.   ezetimibe (ZETIA) 10 MG tablet Take 1 tablet (10 mg total) by mouth daily.   losartan (COZAAR) 25 MG tablet Take 1 tablet (25 mg total) by mouth daily.   metoprolol succinate (TOPROL-XL) 50 MG 24 hr tablet Take 75 mg by mouth daily. Take with or immediately following a meal.   [DISCONTINUED] clopidogrel (PLAVIX) 75 MG tablet Take 1 tablet (75 mg total) by mouth daily.   [DISCONTINUED] losartan (COZAAR) 50 MG tablet Take 1 tablet (50 mg total) by mouth daily.     Allergies:   Budesonide   Social History   Socioeconomic History   Marital status: Single    Spouse name: Not on file   Number of children: Not on file   Years of education: Not on file   Highest education level: Not on file  Occupational History   Not on file  Tobacco Use   Smoking status: Former    Packs/day: 1.50    Years: 30.00    Total pack years: 45.00    Types: Cigarettes   Smokeless tobacco: Never  Vaping Use   Vaping Use: Never used  Substance and Sexual Activity   Alcohol use: Not Currently   Drug use: Never   Sexual activity: Not Currently  Other Topics Concern   Not on file  Social History Narrative   Not on file   Social Determinants of Health   Financial Resource Strain: Not on file  Food Insecurity: Not on file  Transportation Needs: Not on file  Physical Activity: Not on file  Stress:  Not on file  Social Connections: Not on file     Family History: The patient's family history includes Premature CHD in her father.  ROS:   Review of Systems  Constitutional:  Negative for fever and malaise/fatigue.  HENT:  Negative for nosebleeds.   Respiratory:  Negative for cough and shortness of breath.   Cardiovascular:  Negative for chest pain, orthopnea, claudication, leg swelling and PND.  Gastrointestinal:  Positive for nausea. Negative for diarrhea and melena.  Genitourinary:  Negative for flank pain and hematuria.  Musculoskeletal:  Negative for back pain and neck pain.  Neurological:  Positive for dizziness.  Endo/Heme/Allergies:  Does not bruise/bleed easily.  Psychiatric/Behavioral:  Negative for depression and suicidal ideas. The patient is nervous/anxious.      EKGs/Labs/Other Studies Reviewed:    The following studies were reviewed today:  Intraoperative TEE 09/08/2021: Complications: No known complications during this procedure.  POST-OP IMPRESSIONS  Overall, there were no significant changes from pre-bypass.  _ Left Ventricle: The left ventricle is unchanged from pre-bypass.  _ Right Ventricle: The right ventricle appears unchanged from pre-bypass.  _ Aorta: The aorta appears unchanged from pre-bypass.  _ Left Atrium: The left atrium appears unchanged from pre-bypass.  _ Left Atrial Appendage: The left atrial appendage appears unchanged from pre-bypass.  _ Aortic Valve: The aortic valve appears unchanged from pre-bypass.  _ Mitral Valve: The mitral valve appears unchanged from pre-bypass.  _ Tricuspid Valve: The tricuspid valve appears unchanged from pre-bypass.  _ Pulmonic Valve: The pulmonic valve appears unchanged from pre-bypass.  _ Interatrial Septum: The interatrial septum appears unchanged from  pre-bypass.  _ Interventricular Septum: The interventricular septum appears unchanged from pre-bypass.  _ Pericardium: The pericardium appears unchanged  from pre-bypass.   Carotid US 09/02/2021 Summary:  Right Carotid: Velocities in the right ICA are consistent with a 40-59%                 stenosis.   Left Carotid: Velocities in the left ICA are consistent with a 1-39%  stenosis.  Vertebrals:  Bilateral vertebral arteries demonstrate antegrade flow.  Subclavians: Normal flow hemodynamics were seen in bilateral subclavian arteries.    TTE 08/31/2021  1. Left ventricular ejection fraction, by estimation, is 70 to 75%. The  left ventricle has hyperdynamic function. The left ventricle has no  regional wall motion abnormalities. There is moderate concentric left  ventricular hypertrophy. Left ventricular  diastolic parameters are consistent with Grade I diastolic dysfunction  (impaired relaxation).   2. Right ventricular systolic function is normal. The right ventricular  size is normal. Tricuspid regurgitation signal is inadequate for assessing  PA pressure.   3. The mitral valve is normal in structure. Trivial mitral valve  regurgitation.   4. The aortic valve is tricuspid. There is mild calcification of the  aortic valve. There is mild thickening of the aortic valve. Aortic valve  regurgitation is trivial.   5. Aortic dilatation noted. 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   6. The inferior vena cava is normal in size with greater than 50%  respiratory variability, suggesting right atrial pressure of 3 mmHg.   LHC 09/01/2021 Conclusions: Severe three-vessel coronary artery disease, as detailed below. Mildly elevated left ventricular filling pressure.   Recommendations: Cardiac surgery consultation for CABG. Restart IV heparin 2 hours after TR band removal. Start isosorbide mononitrate and wean off nitroglycerin infusion, as tolerated. Aggressive secondary prevention of coronary artery disease.  EKG: EKG is personally reviewed. 09/04/22: EKG was not ordered. 10/07/21: NSR, 75 bpm, low voltage,  non-specific ST changes post CABG - no acute changes. Bernadene Person, NP)  Recent Labs: 09/09/2021: Magnesium 2.4 09/19/2021: ALT 12; TSH 2.413 10/14/2021: Hemoglobin 12.9; Platelets 255 03/09/2022: BUN 32; Creatinine, Ser 0.87; Potassium 4.4; Sodium 135   Recent Lipid Panel    Component Value Date/Time   CHOL 205 (H) 08/31/2021 0052   TRIG 54 08/31/2021 0052   HDL 49 08/31/2021 0052   CHOLHDL 4.2 08/31/2021 0052   VLDL 11 08/31/2021 0052   LDLCALC 145 (H) 08/31/2021 0052     Risk Assessment/Calculations:           Physical Exam:    VS:  BP (!) 178/108   Pulse 79   Ht 5\' 3"  (1.6 m)   Wt 143 lb (64.9 kg)   SpO2 96%   BMI 25.33 kg/m     Wt  Readings from Last 3 Encounters:  09/04/22 143 lb (64.9 kg)  03/02/22 135 lb (61.2 kg)  11/25/21 131 lb (59.4 kg)     GEN: Well nourished, well developed in no acute distress HEENT: Normal NECK: No JVD; No carotid bruits CARDIAC: RRR, no murmurs RESPIRATORY:  Clear to auscultation without rales, wheezing or rhonchi  ABDOMEN: Soft, non-tender, non-distended MUSCULOSKELETAL:  No edema; No deformity  SKIN: Warm and dry NEUROLOGIC:  Alert and oriented x 3 PSYCHIATRIC:  Anxious  ASSESSMENT:    1. Coronary artery disease involving native coronary artery of native heart without angina pectoris   2. Mild carotid artery disease (HCC)   3. Essential hypertension   4. Primary hypertension   5. Hx of CABG   6. Hyperlipidemia LDL goal <70   7. Anxiety     PLAN:    In order of problems listed above:  #Multivessel CAD s/p CABGx4: #History of NSTEMI: Patient admitted with non-STEMI in 08/2021 found to have three-vessel CAD. Status post CABG x4 on 09/08/2021. LIMA was determined to be poor candidate for revascularization at time of surgery.  She underwent SVG to LAD, SVG to RCA, SVG to OM and OM 2. Pre-op TTE with LVEF 70-75%. -Continue ASA  daily -Stop plavix as completed 1 year of therapy -Continue metop  XL daily -Not  tolerating losartan  daily; prefers losartan  daily -Continue zetia  daily -Declined PCSK9i after multiple attempts to start   #Carotid artery disease -Right ICA 40-59%, left ICA 1-39%. -Continue ASA as above -Continue zetia  daily -Declined PCSK9i as above -Continue annual monitoring   #Hypertension Difficult to monitor due to significant anxiety with taking blood pressure and white coat hypertension. Discussed how treating her anxiety may help symptoms and improve blood pressure readings. She states she is not tolerating her higher dose of losartan. She wishes to decrease at this time. -Continue metop  XL daily -Not tolerating losartan  daily; prefers losartan  daily -Management of anxiety per PCP   #Autoimmune hepatitis -On azathioprine -Follows at [facility]   #Hyperlipidemia Not statin due to autoimmune hepatitis.   -Continue zetia  daily -Declined PCSK9i as detailed above  #History of Tobacco Abuse: -Successfully quit  #Anxiety: Patient admits to significant anxiety which has been difficult to manage. Follows with PCP.     Follow-up: 6 months  Medication Adjustments/Labs and Tests Ordered: Current medicines are reviewed at length with the patient today.  Concerns regarding medicines are outlined above.   No orders of the defined types were placed in this encounter.  Meds ordered this encounter  Medications   losartan (COZAAR) 25 MG tablet    Sig: Take 1 tablet (25 mg total) by mouth daily.    Dispense:  90 tablet    Refill:  2    Dose decrease   Patient Instructions  Medication Instructions:   STOP TAKING PLAVIX  DECREASE YOUR LOSARTAN TO 25 MG BY MOUTH DAILY  *If you need a refill on your cardiac medications before your next appointment, please call your pharmacy*    Follow-Up: At [facility], you and your health needs are our priority.  As part of our continuing mission to provide you with exceptional heart care,  we have created designated Provider Care Teams.  These Care Teams include your primary Cardiologist (physician) and Advanced Practice Providers (APPs -  Physician Assistants and Nurse Practitioners) who all work together to provide you with the care you need, when you need it.  We recommend  signing up for the patient portal called "MyChart".  Sign up information is provided on this After Visit Summary.  MyChart is used to connect with patients for Virtual Visits (Telemedicine).  Patients are able to view lab/test results, encounter notes, upcoming appointments, etc.  Non-urgent messages can be sent to your provider as well.   To learn more about what you can do with MyChart, go to [email or web address].    Your next appointment:   6 month(s)  The format for your next appointment:   In Person  Provider:   [name], MD      Important Information About Sugar          I,Mitra Faeizi,acting as a scribe for [name], MD.,have documented all relevant documentation on the behalf of [name], MD,as directed by  [name], MD while in the presence of [name], MD.  I, [name], MD, have reviewed all documentation for this visit. The documentation on 09/04/22 for the exam, diagnosis, procedures, and orders are all accurate and complete.   Signed, [name], MD  09/04/2022 9:42 AM    [facility]

## 2022-09-04 NOTE — Patient Instructions (Signed)
Medication Instructions:   STOP TAKING PLAVIX  DECREASE YOUR LOSARTAN TO 25 MG BY MOUTH DAILY  *If you need a refill on your cardiac medications before your next appointment, please call your pharmacy*    Follow-Up: At [facility], you and your health needs are our priority.  As part of our continuing mission to provide you with exceptional heart care, we have created designated Provider Care Teams.  These Care Teams include your primary Cardiologist (physician) and Advanced Practice Providers (APPs -  Physician Assistants and Nurse Practitioners) who all work together to provide you with the care you need, when you need it.  We recommend signing up for the patient portal called "MyChart".  Sign up information is provided on this After Visit Summary.  MyChart is used to connect with patients for Virtual Visits (Telemedicine).  Patients are able to view lab/test results, encounter notes, upcoming appointments, etc.  Non-urgent messages can be sent to your provider as well.   To learn more about what you can do with MyChart, go to [email or web address].    Your next appointment:   6 month(s)  The format for your next appointment:   In Person  Provider:   [name], MD      Important Information About Sugar

## 2022-11-24 ENCOUNTER — Other Ambulatory Visit: Payer: Self-pay | Admitting: *Deleted

## 2022-11-24 NOTE — Telephone Encounter (Signed)
Received a refill or Plavix from [facility].  Will fax back and let them know medication has been discontinued.

## 2023-02-08 DIAGNOSIS — K219 Gastro-esophageal reflux disease without esophagitis: Secondary | ICD-10-CM | POA: Diagnosis not present

## 2023-02-08 DIAGNOSIS — R0602 Shortness of breath: Secondary | ICD-10-CM | POA: Diagnosis not present

## 2023-02-08 DIAGNOSIS — F419 Anxiety disorder, unspecified: Secondary | ICD-10-CM | POA: Diagnosis not present

## 2023-02-08 DIAGNOSIS — I2510 Atherosclerotic heart disease of native coronary artery without angina pectoris: Secondary | ICD-10-CM | POA: Diagnosis not present

## 2023-02-08 DIAGNOSIS — Z6826 Body mass index (BMI) 26.0-26.9, adult: Secondary | ICD-10-CM | POA: Diagnosis not present

## 2023-02-08 DIAGNOSIS — I10 Essential (primary) hypertension: Secondary | ICD-10-CM | POA: Diagnosis not present

## 2023-02-09 LAB — LAB REPORT - SCANNED: EGFR (Non-African Amer.): 55

## 2023-02-10 ENCOUNTER — Telehealth: Payer: Self-pay | Admitting: Cardiology

## 2023-02-10 NOTE — Telephone Encounter (Signed)
Patient called the answering service this evening and reports that her primary care provider had ordered labs on her and had called to tell her that her protein was very high.  Her PCP told her that this could mean she has heart failure, and her PCP had sent an urgent referral to Dr. Shari Prows.  Patient was very upset that her primary care provider did not give her more information.  She was told that her kidney function and renal function labs had not yet been resulted, and she is upset that she does not have all the information.  She follows with [facility] physicians.  I informed patient that I am unable to view labs ordered by [facility], and if she has questions about the labs that they ordered she will need to talk to the ordering provider.  Patient again expressed that she was very upset and and sounded anxious/tearful on the phone.  She is very anxious that she does not have more information and is concerned that an urgent referral means that she has a serious condition.  I explained that off of her protein labs alone, it is very difficult to determine if she has heart failure.  Patient has been feeling somewhat short of breath at night, but does not have ankle swelling or shortness of breath while at rest.  She denies having chest pain.  I told patient that as [place] as she is feeling well, the best thing for her to do is to wait for our office to contact her to make an appointment.  Explained that an urgent follow-up does not necessarily mean that she is severely ill, but rather means that she needs to be seen sooner rather than later. I assured her that our office would contact her tomorrow to arrange an appointment. Referral was sent this evening around 5:15 PM, so I will route this message to our staff to ensure she gets a call early in the AM.

## 2023-02-11 ENCOUNTER — Encounter: Payer: Self-pay | Admitting: Interventional Cardiology

## 2023-02-11 ENCOUNTER — Ambulatory Visit: Payer: Medicare HMO | Attending: Interventional Cardiology | Admitting: Interventional Cardiology

## 2023-02-11 VITALS — BP 188/111 | HR 90 | Ht 63.0 in | Wt 146.6 lb

## 2023-02-11 DIAGNOSIS — K219 Gastro-esophageal reflux disease without esophagitis: Secondary | ICD-10-CM

## 2023-02-11 DIAGNOSIS — I10 Essential (primary) hypertension: Secondary | ICD-10-CM

## 2023-02-11 DIAGNOSIS — I25118 Atherosclerotic heart disease of native coronary artery with other forms of angina pectoris: Secondary | ICD-10-CM | POA: Diagnosis not present

## 2023-02-11 DIAGNOSIS — Z951 Presence of aortocoronary bypass graft: Secondary | ICD-10-CM | POA: Diagnosis not present

## 2023-02-11 DIAGNOSIS — R0602 Shortness of breath: Secondary | ICD-10-CM | POA: Diagnosis not present

## 2023-02-11 NOTE — Telephone Encounter (Signed)
Spoke with patient. She reports she only slept 2 hours last night and has been very anxious since receiving a phone call from her PCP's office regarding recent lab results yesterday evening.  She reports she was told by Dr. Rondel Baton office that her D-dimer and BNP were elevated, and they have sent an urgent referral to Dr. Shari Prows to be evaluated for possible heart failure.  Patient admits to having anxiety especially around her health as she has had an MI and CABG in the past. She states she has been told her labs have been emergent in the past and then later told they were not. She expresses much anxiety regarding recent lab results.  She denies swelling, CP, heartburn, nausea/vomiting. She does report SOB in the evening and describes feeling "tired as a dog" and lightheaded. She is aware this can be from anxiety at times, but is now concerned with elevated labs it may be her heart.  She is requesting to be seen today because she is very worried over this and wants to be sure she is going to be OK and to address labs/SOB.  Scheduled appointment for patient to see Dr. Eldridge Dace (DOD) today at 4:30pm.  Advised on ED precautions if she develops CP or worsening SOB before appt today.  Patient verbalized understanding and expressed appreciation.  Called and requested labs from 02/08/23 and recent CXR to be faxed to our office from PCP's office, spoke with Tammy and she will fax to [place] at [phone].

## 2023-02-11 NOTE — Patient Instructions (Signed)
Medication Instructions:  Your physician recommends that you continue on your current medications as directed. Please refer to the Current Medication list given to you today.  *If you need a refill on your cardiac medications before your next appointment, please call your pharmacy*   Lab Work: .Lab work done today--CMET If you have labs (blood work) drawn today and your tests are completely normal, you will receive your results only by: MyChart Message (if you have MyChart) OR A paper copy in the mail If you have any lab test that is abnormal or we need to change your treatment, we will call you to review the results.   Testing/Procedures: none   Follow-Up: At [facility], you and your health needs are our priority.  As part of our continuing mission to provide you with exceptional heart care, we have created designated Provider Care Teams.  These Care Teams include your primary Cardiologist (physician) and Advanced Practice Providers (APPs -  Physician Assistants and Nurse Practitioners) who all work together to provide you with the care you need, when you need it.  We recommend signing up for the patient portal called "MyChart".  Sign up information is provided on this After Visit Summary.  MyChart is used to connect with patients for Virtual Visits (Telemedicine).  Patients are able to view lab/test results, encounter notes, upcoming appointments, etc.  Non-urgent messages can be sent to your provider as well.   To learn more about what you can do with MyChart, go to [email or web address].    Your next appointment:   June 26  Provider:   [name], MD     Other Instructions

## 2023-02-11 NOTE — Progress Notes (Signed)
Cardiology Office Note   Date:  02/11/2023   ID:  [name] [name], [name] [name] 07-03-1946, MRN [id number]  PCP:  Sigmund Hazel, MD    No chief complaint on file.  CAD  Wt Readings from Last 3 Encounters:  02/11/23 146 lb 9.6 oz (66.5 kg)  09/04/22 143 lb (64.9 kg)  03/02/22 135 lb (61.2 kg)       History of Present Illness: [name] [name] is a 77 y.o. 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   Patient was hospitalized from 08/31/2019 to 09/13/2021 in the setting of exertional chest discomfort/NSTEMI. Cardiac catheterization on 09/01/2021 showed severe three-vessel CAD (dLM 50%, p-mLAD 50%, mLAD-1 90%, mLAD-2 50%, D1 20%, o-pCx 70%, dCx 20%, p-mRCA 90%, dRCA 50%, RPAV 40%). Carotid dopplers showed 40-59% RICA stenosis, 1 to 39% LICA stenosis. Echo showed EF >65%, moderately increased LV wall thickness, moderate concentric LVH. She underwent CABG x4 (SVG-LAD, SVG-RCA, and Sequential SVG-OM1 and OM2) on 09/08/2021. She was started on Zoloft for increased anxiety and depression. She was discharged home in stable condition on 09/13/21 on aspirin, zetia and metoprolol (statin not started due to autoimmune hepatitis) but subsequently returned to the ED on 09/19/2021 in the setting of nausea and fatigue. Troponin was elevated at 420, 436. Cardiology was consulted. EKG showed frequent PVCs, no ST changes. Findings not consistent with ACS. Abdominal ultrasound was normal.  She was given IV fluids in the setting of probable dehydration, acute gastroenteritis, and was discharged home in stable condition patient is to follow-up with GI as an outpatient.   Was seen by Bernadene Person, NP on 10/07/21 where she was doing well. Remained very active with only mild dyspnea. No chest pain. She was also initiated on losartan.    Saw Dr. Laneta Simmers on 10/15/21 where she again was doing  well. Recommended for prn follow-up.   Seen in clinic 11/25/21 where she was doing well from a CV standpoint but was struggling with a lot of anxiety. Had remained active and was walking 5 miles a day. She ultimately declined starting PCSk9i after multiple attempts to start it.   Was last seen in clinic on 02/2022 where she continued to suffer with significant anxiety but was doing well from a CV standpoint. Remained very active. Declined starting any other lipid lowering therapy.  In 12/23: "Otherwise, she is very stable from a CV standpoint. Remains very active and walks 5-47miles per day. She denies any palpitations, chest pain, shortness of breath, or peripheral edema. No headaches, syncope, orthopnea, or PND. "  BP typically elevated in the doctors office.    She was seen by her primary care doctor on Feb 09, 2023.  There was a concern for dehydration.  BNP was also checked and was found to be elevated at 293.  Hemoglobin was 15.5.  D-dimer was minimally elevated.  She walks at the park, 4-5 miles a day.  She notes shortness of breath in the evenings. It can happen at other times during the day as well.  She gained weight with quitting smoking.    She has also had some GERD sx as well, some of which were similar to what she had prior to CABG. however, prior to CABG, she had exertional symptoms.  Currently, she feels well while walking.  She was very surprised by the concern about congestive heart  failure.  She was very anxious.  She did not sleep last night after getting the lab results.  She called her brother and discussed her final will and testament.  After our discussion today, she felt reassured.  Past Medical History:  Diagnosis Date   Anxiety    Hepatitis     Past Surgical History:  Procedure Laterality Date   CORONARY ARTERY BYPASS GRAFT N/A 09/08/2021   Procedure: CORONARY ARTERY BYPASS GRAFTING (CABG) X 4  USING LEFT INTERNAL MAMMARY ARTERY AND RIGHT ENDOSCOPIC GREATER  SAPHENOUS VEIN CONDUITS.;  Surgeon: Alleen Borne, MD;  Location: [facility] OR;  Service: Open Heart Surgery;  Laterality: N/A;   ENDOVEIN HARVEST OF GREATER SAPHENOUS VEIN Right 09/08/2021   Procedure: ENDOVEIN HARVEST OF GREATER SAPHENOUS VEIN;  Surgeon: Alleen Borne, MD;  Location: [facility] OR;  Service: Open Heart Surgery;  Laterality: Right;   LEFT HEART CATH AND CORONARY ANGIOGRAPHY N/A 09/01/2021   Procedure: LEFT HEART CATH AND CORONARY ANGIOGRAPHY;  Surgeon: Yvonne Kendall, MD;  Location: [facility] INVASIVE CV LAB;  Service: Cardiovascular;  Laterality: N/A;   TEE WITHOUT CARDIOVERSION N/A 09/08/2021   Procedure: TRANSESOPHAGEAL ECHOCARDIOGRAM (TEE);  Surgeon: Alleen Borne, MD;  Location: [facility] OR;  Service: Open Heart Surgery;  Laterality: N/A;     Current Outpatient Medications  Medication Sig Dispense Refill   acetaminophen (TYLENOL) 325 MG tablet Take 2 tablets (650 mg total) by mouth every 6 (six) hours as needed for mild pain.     aspirin EC 81 MG tablet Take 1 tablet (81 mg total) by mouth daily. Swallow whole. 90 tablet 3   ezetimibe (ZETIA) 10 MG tablet Take 1 tablet (10 mg total) by mouth daily. 30 tablet 3   losartan (COZAAR) 25 MG tablet Take 1 tablet (25 mg total) by mouth daily. 90 tablet 2   metoprolol succinate (TOPROL-XL) 50 MG 24 hr tablet Take 75 mg by mouth daily. Take with or immediately following a meal. 90 tablet 3   No current facility-administered medications for this visit.    Allergies:   Budesonide    Social History:  The patient  reports that she has quit smoking. Her smoking use included cigarettes. She has a 45.00 pack-year smoking history. She has never used smokeless tobacco. She reports that she does not currently use alcohol. She reports that she does not use drugs.   Family History:  The patient's family history includes Premature CHD in her father.    ROS:  Please see the history of present illness.   Otherwise, review of systems are positive for shortness  of breath.   All other systems are reviewed and negative.    PHYSICAL EXAM: VS:  BP (!) 188/111   Pulse 90   Ht 5\' 3"  (1.6 m)   Wt 146 lb 9.6 oz (66.5 kg)   SpO2 97%   BMI 25.97 kg/m  , BMI Body mass index is 25.97 kg/m. GEN: Well nourished, well developed, in no acute distress HEENT: normal Neck: no JVD, carotid bruits, or masses Cardiac: RRR; no murmurs, rubs, or gallops,no edema  Respiratory:  clear to auscultation bilaterally, normal work of breathing GI: soft, nontender, nondistended, + BS MS: no deformity or atrophy Skin: warm and dry, no rash Neuro:  Strength and sensation are intact Psych: euthymic mood, full affect   EKG:   The ekg ordered today demonstrates NSR, inferior Q waves   Recent Labs: 03/09/2022: BUN 32; Creatinine, Ser 0.87; Potassium 4.4; Sodium 135   Lipid  Panel    Component Value Date/Time   CHOL 205 (H) 08/31/2021 0052   TRIG 54 08/31/2021 0052   HDL 49 08/31/2021 0052   CHOLHDL 4.2 08/31/2021 0052   VLDL 11 08/31/2021 0052   LDLCALC 145 (H) 08/31/2021 0052     Other studies Reviewed: Additional studies/ records that were reviewed today with results demonstrating: .   ASSESSMENT AND PLAN:  CAD: Had GERD prior to CABG.  Has some GERD which is clearly related to certain foods.  Pantoprazole was prescribed but she has not tried this medication as of yet.  I asked her to try it.  I think if her GERD continues despite pantoprazole, will plan for nuclear stress test. Elevated BNP: concern for CHF.  She does not appear to have fluid overload.  We walked her in the office and she did not desaturate.  Echocardiogram past has shown normal LV function.  I suspect her LV function is still preserved. Hyperkalemia: 6.0 3 days ago. Repeat potassium check today.   Current medicines are reviewed at length with the patient today.  The patient concerns regarding her medicines were addressed.  The following changes have been made:  No change  Labs/ tests  ordered today include: Electrolytes No orders of the defined types were placed in this encounter.   Recommend 150 minutes/week of aerobic exercise Low fat, low carb, high fiber diet recommended  Disposition:   FU in with Dr. Shari Prows 6/26   Signed, Lance Muss, MD  02/11/2023 4:37 PM    [facility] [address] [place], [place], [place]  [address] Phone: [phone]; Fax: [phone]

## 2023-02-12 ENCOUNTER — Telehealth: Payer: Self-pay | Admitting: Interventional Cardiology

## 2023-02-12 ENCOUNTER — Other Ambulatory Visit: Payer: Self-pay | Admitting: Internal Medicine

## 2023-02-12 DIAGNOSIS — E875 Hyperkalemia: Secondary | ICD-10-CM

## 2023-02-12 LAB — COMPREHENSIVE METABOLIC PANEL
ALT: 49 IU/L — ABNORMAL HIGH (ref 0–32)
AST: 42 IU/L — ABNORMAL HIGH (ref 0–40)
Albumin/Globulin Ratio: 1.5 (ref 1.2–2.2)
Albumin: 4.8 g/dL (ref 3.8–4.8)
Alkaline Phosphatase: 56 IU/L (ref 44–121)
BUN/Creatinine Ratio: 26 (ref 12–28)
BUN: 27 mg/dL (ref 8–27)
Bilirubin Total: 0.3 mg/dL (ref 0.0–1.2)
CO2: 22 mmol/L (ref 20–29)
Calcium: 10.5 mg/dL — ABNORMAL HIGH (ref 8.7–10.3)
Chloride: 103 mmol/L (ref 96–106)
Creatinine, Ser: 1.02 mg/dL — ABNORMAL HIGH (ref 0.57–1.00)
Globulin, Total: 3.1 g/dL (ref 1.5–4.5)
Glucose: 88 mg/dL (ref 70–99)
Potassium: 5.8 mmol/L (ref 3.5–5.2)
Sodium: 141 mmol/L (ref 134–144)
Total Protein: 7.9 g/dL (ref 6.0–8.5)
eGFR: 57 mL/min/{1.73_m2} — ABNORMAL LOW (ref >59)

## 2023-02-12 MED ORDER — LOKELMA 10 G PO PACK: 10.0000 g | PACK | Freq: Every day | ORAL | 0 refills | Status: AC

## 2023-02-12 MED ORDER — LOKELMA 10 G PO PACK: 10.0000 g | PACK | Freq: Every day | ORAL | 0 refills | Status: DC

## 2023-02-12 MED ORDER — AMLODIPINE BESYLATE 5 MG PO TABS: 5.0000 mg | ORAL_TABLET | Freq: Every day | ORAL | 3 refills | Status: DC

## 2023-02-12 NOTE — Telephone Encounter (Signed)
Pt c/o medication issue:  1. Name of Medication:     losartan (COZAAR) 25 MG tablet   2. How are you currently taking this medication (dosage and times per day)?   3. Are you having a reaction (difficulty breathing--STAT)?   4. What is your medication issue?   Patient stated she wants a call back regarding medication change to losartan (COZAAR) 25 MG tablet and her potassium.  Patient stated she will be going out of town today.

## 2023-02-12 NOTE — Telephone Encounter (Signed)
Called and spoke in detail with patient who is currently at pharmacy waiting on [facility] prescription. She states she is not going to take losartan any longer. She feels that this medicine has "been a problem since starting it, we've already went down on dose once, and I just don't want anymore." She does agree to start amlodipine to manage her HTN. [facility] per request. She is leaving today for a trip to [place] and has repeat labs scheduled for Wednesday morning with [facility] to re-check K level. States she will pick up amlodipine from pharmacy Tuesday when she gets back to [place]. Losartan removed from [facility].

## 2023-02-12 NOTE — Telephone Encounter (Signed)
Spoke with pt who states she received a call from Dr Lyn Hollingshead, cardiology on call, last night who advised pt due to elevated potassium she should decrease her Losartan 25mg  - to 1/2 tablet by mouth daily and begin Lokelma 10g x 2 doses and repeat BMET next week.  Pt states she is leaving to go to [place] today.  Recommended pt follow Dr Mardelle Matte recommendations.  Pt states she will not return until Tuesday and will have her PCP office redraw BMET.  Advised pt she also contact her PCP office to advise K+ remains elevated as they were the provider that initially diagnosed.  Pt verbalizes understanding and agrees with current plan.

## 2023-02-12 NOTE — Progress Notes (Signed)
Notified re k 5.8 Lokelma 10 x2 sent to the pharmacy. Attempted cell and home phone. VM left- decrease losartan, start lokelma and repeat bmp.

## 2023-02-12 NOTE — Telephone Encounter (Signed)
Received call back from pt who states [facility] not [facility] and it will be next week before medication is in.  Medication is also not covered by pt's insurance and will be $75 out of pocket. Asked pt if she uses another pharmacy in the area.  Pt states she can also use [facility] in [place].  RN spoke with pharmacist who states medication is in stock and cash price is $65.99.   Rx sent to [facility] as pt is agreeable to cost.  [facility].  Pt thanked Charity fundraiser for the assistance.

## 2023-02-12 NOTE — Telephone Encounter (Signed)
-----   Message from Corky Crafts, MD sent at 02/12/2023 10:11 AM EDT ----- Potassium still elevated, was 6.0 at PMD on Tuesday. If ok with [name] [name],  I would Stop losartan.  Start amlodipine 5 mg daily. COntinue to check BP.

## 2023-02-17 ENCOUNTER — Telehealth: Payer: Self-pay | Admitting: Interventional Cardiology

## 2023-02-17 DIAGNOSIS — R0602 Shortness of breath: Secondary | ICD-10-CM

## 2023-02-17 DIAGNOSIS — I10 Essential (primary) hypertension: Secondary | ICD-10-CM | POA: Diagnosis not present

## 2023-02-17 NOTE — Telephone Encounter (Signed)
Spoke with patient and shared Dr. Devin Going response:  Her blood pressure has been super hard to manage as she has such anxiety around her blood pressure and medications, it is always through the roof when she checks it. I agree with the amlodipine and love that she is willing to take it. Totally fine with me repeating a limited TTE to reassess LVEF    Medication list updated. Limited echo ordered. Scheduler will call patient to schedule appt for echo.  Patient verbalized understanding and expressed appreciation for call.

## 2023-02-17 NOTE — Telephone Encounter (Signed)
Returned call to patient. She states she saw Linton Rump, DNP at [facility] this morning. When she went home she was looking over her visit notes and saw it was mentioned that she is being seen for CHF and is recommended to have an echo.  Patient would like to know if Dr. Shari Prows or Dr. Eldridge Dace feel that she needs to have an echo done, and if so she prefers to have that done at our office.  She reports SBP 170 this morning at OV, which she states is an improvement from what it normally is (180's-200's). She states she was advised on 5/17 to stop Losartan and start amlodipine 5mg  QD, she did not do this until last night (5/21). At visit today she was instructed to increase amlodipine to 10mg  QD.  She also had blood work to recheck potassium level and it is now 4.9.  Patient also states she was notified by PCP office that she has COPD and emphysema. Patient states she sent a message to PCP office and requested recent labs and office visit note be faxed to our office for Dr. Shari Prows and Dr. Eldridge Dace to review.  Patient states she does have SOB which she has had since her open heart surgery in December 2022, but she has noticed when she is walking her 4-6 miles per day she gets short of breath and has to rest more frequently than she did before. She states this started in February 2024.  Patient would like to know if Dr. Shari Prows or Dr. Eldridge Dace recommends she have an echo performed.  Will forward to Dr. Shari Prows and Dr. Eldridge Dace to review and advise.

## 2023-02-17 NOTE — Telephone Encounter (Signed)
New Message:    Patient said she saw  Linton Rump at [facility] on [address] She recommended that patient needs an Echo. She saw Dr Eldridge Dace last week. She wants to ask him, does he think she needs to have an Echo? She was also told today that she has COPD, first time that she was aware of this. Patient also wanted Dr Eldridge Dace  to know that her Potassium back to normal(4.9).

## 2023-03-17 ENCOUNTER — Telehealth: Payer: Self-pay | Admitting: Cardiology

## 2023-03-17 ENCOUNTER — Ambulatory Visit (HOSPITAL_COMMUNITY): Payer: Medicare HMO | Attending: Cardiology

## 2023-03-17 DIAGNOSIS — Z951 Presence of aortocoronary bypass graft: Secondary | ICD-10-CM

## 2023-03-17 DIAGNOSIS — R0602 Shortness of breath: Secondary | ICD-10-CM

## 2023-03-17 DIAGNOSIS — I25118 Atherosclerotic heart disease of native coronary artery with other forms of angina pectoris: Secondary | ICD-10-CM

## 2023-03-17 DIAGNOSIS — I77819 Aortic ectasia, unspecified site: Secondary | ICD-10-CM

## 2023-03-17 LAB — ECHOCARDIOGRAM LIMITED
Area-P 1/2: 4.04 cm2
S' Lateral: 2 cm

## 2023-03-17 NOTE — Telephone Encounter (Signed)
[name] [name], [name] [name] - 03/17/2023  9:06 AM [name], MD  Sent: Wed March 17, 2023  1:14 PM  To: Loa Socks, LPN         Message  If she was swelling on the 10mg , she can stay on the 5mg  for now and would start hydrochlorothiazide 25mg  daily to see if this both helps the swelling and the blood pressure. We can get labs when we see her next week.    Pt is aware that Dr. Shari Prows said she can stay on the 5 mg tablets of amlodipine vs the 10 mg tablets.  Also endorsed to her that Dr. Shari Prows recommends that she start taking hydrochlorothiazide 25 mg po daily for BP management, and we can do labs on her at next weeks OV.  Pt states she prefers holding off on the hydrochlorothiazide for now, and further discuss this more in detail with Dr. Shari Prows when she see's her next Wed. Pt states she deals with extreme anxiety and starting anything new without talking face-to-face with the Provider about it, makes her more anxious. Informed the pt that would be fine and I will make Dr. Shari Prows aware of this.    Pt verbalized understanding and agrees with this plan.    OFF NOTE:  [name], MD  Loa Socks, LPN Echo looks stable with normal pumping function and no significant valve disease. 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  Advised her to maintain good BP management/take her cardiac medications.  Advised her to watch her salt intake. Will place the order for repeat echo in one year in the system and send a message to our ECHO Scheduler to call her back to arrange one year echo.  Pt verbalized understanding and agrees with this plan.

## 2023-03-17 NOTE — Telephone Encounter (Signed)
Taleeyah, Azcarate - 03/17/2023  9:06 AM [name], MD  Sent: Wed March 17, 2023  1:14 PM  To: Loa Socks, LPN         Message  If she was swelling on the 10mg , she can stay on the 5mg  for now and would start hydrochlorothiazide 25mg  daily to see if this both helps the swelling and the blood pressure. We can get labs when we see her next week.     Left the pt a message to call the office back.

## 2023-03-17 NOTE — Telephone Encounter (Signed)
Patient is returning call. Transferred to Ivy, LPN.  

## 2023-03-17 NOTE — Telephone Encounter (Signed)
New message   Patient stopped by checkout after having an echo today.  She states that her PCP changed her amlodipine to 10mg  daily on may 22nd (approx.).  Patient states that she had some ankle swelling while on the 10mg  so approx. 2 days ago, she started taking the 5mg  dosage again.  Patient want to know if Dr Shari Prows want her to stay on 5mg  or resume 10mg  of amlodipine.  Today, she states that her ankles "seemed a little puffy".  Patient has an appt with Dr Shari Prows next Wednesday.  Please call and advise her on how much medication to take until her appt with Dr Shari Prows.  Please call pt at [phone].

## 2023-03-18 ENCOUNTER — Encounter (HOSPITAL_COMMUNITY): Payer: Self-pay

## 2023-03-21 NOTE — Progress Notes (Unsigned)
Cardiology Office Note:    Date:  03/24/2023   ID:  [name], [name] 11/06/1945, MRN [id number]  PCP:  Sigmund Hazel, MD   [facility] Providers Cardiologist:  [name], MD {  Referring MD: Sigmund Hazel, MD    History of Present Illness:    [name] [name] is a 77 y.o. 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  Patient was hospitalized from 08/31/2019 to 09/13/2021 in the setting of exertional chest discomfort/NSTEMI. Cardiac catheterization on 09/01/2021 showed severe three-vessel CAD (dLM 50%, p-mLAD 50%, mLAD-1 90%, mLAD-2 50%, D1 20%, o-pCx 70%, dCx 20%, p-mRCA 90%, dRCA 50%, RPAV 40%). Carotid dopplers showed 40-59% RICA stenosis, 1 to 39% LICA stenosis. Echo showed EF >65%, moderately increased LV wall thickness, moderate concentric LVH. She underwent CABG x4 (SVG-LAD, SVG-RCA, and Sequential SVG-OM1 and OM2) on 09/08/2021. She was started on Zoloft for increased anxiety and depression. She was discharged home in stable condition on 09/13/21 on aspirin, zetia and metoprolol (statin not started due to autoimmune hepatitis) but subsequently returned to the ED on 09/19/2021 in the setting of nausea and fatigue. Troponin was elevated at 420, 436. Cardiology was consulted. EKG showed frequent PVCs, no ST changes. Findings not consistent with ACS. Abdominal ultrasound was normal.  She was given IV fluids in the setting of probable dehydration, acute gastroenteritis, and was discharged home in stable condition patient is to follow-up with GI as an outpatient.  Was seen by Bernadene Person, NP on 10/07/21 where she was doing well. Remained very active with only mild dyspnea. No chest pain. She was also initiated on losartan.   Saw Dr. Laneta Simmers on 10/15/21 where she again was doing well. Recommended for prn follow-up.  Seen in clinic 11/25/21 where she was  doing well from a CV standpoint but was struggling with a lot of anxiety. Had remained active and was walking 5 miles a day. She ultimately declined starting PCSk9i after multiple attempts to start it.  Was seen in 01/2023 for urgent visit due to borderline elevated BNP 293. She was euvolemic on exam. Remained very active without exertional symptoms. TTE 02/2023 with EF 60-65%, G1DD, normal RV, no significant valve disease, mild dilation of aorta 41mm.  Today, the patient states she is very anxious. Blood pressure continues to be very elevated in MD office and she reports continued significant white coat HTN. No chest pain, SOB, orthopnea, PND, or LE edema. Remains very active and walks 4.71miles in AM and 1.6miles in PM with no significant exertional symptoms. Discussed adding additional blood pressure medications at length today and she wishes to hold off. Will manage HTN with Dr. Hyacinth Meeker going forward to help streamline care.   Has been off losartan due to hyperK.   Past Medical History:  Diagnosis Date   Anxiety    Hepatitis     Past Surgical History:  Procedure Laterality Date   CORONARY ARTERY BYPASS GRAFT N/A 09/08/2021   Procedure: CORONARY ARTERY BYPASS GRAFTING (CABG) X 4  USING LEFT INTERNAL MAMMARY ARTERY AND RIGHT ENDOSCOPIC GREATER SAPHENOUS VEIN CONDUITS.;  Surgeon: Alleen Borne, MD;  Location: [facility] OR;  Service: Open Heart Surgery;  Laterality: N/A;   ENDOVEIN HARVEST OF GREATER SAPHENOUS VEIN Right 09/08/2021   Procedure: ENDOVEIN HARVEST OF GREATER SAPHENOUS VEIN;  Surgeon: Alleen Borne, MD;  Location: [facility] OR;  Service: Open Heart  Surgery;  Laterality: Right;   LEFT HEART CATH AND CORONARY ANGIOGRAPHY N/A 09/01/2021   Procedure: LEFT HEART CATH AND CORONARY ANGIOGRAPHY;  Surgeon: Yvonne Kendall, MD;  Location: [facility] INVASIVE CV LAB;  Service: Cardiovascular;  Laterality: N/A;   TEE WITHOUT CARDIOVERSION N/A 09/08/2021   Procedure: TRANSESOPHAGEAL ECHOCARDIOGRAM (TEE);   Surgeon: Alleen Borne, MD;  Location: [facility] OR;  Service: Open Heart Surgery;  Laterality: N/A;    Current Medications: Current Meds  Medication Sig   acetaminophen (TYLENOL) 325 MG tablet Take 2 tablets (650 mg total) by mouth every 6 (six) hours as needed for mild pain.   amLODipine (NORVASC) 10 MG tablet Take 10 mg by mouth daily.   aspirin EC 81 MG tablet Take 1 tablet (81 mg total) by mouth daily. Swallow whole.   ezetimibe (ZETIA) 10 MG tablet Take 1 tablet (10 mg total) by mouth daily.   metoprolol succinate (TOPROL-XL) 50 MG 24 hr tablet Take 75 mg by mouth daily. Take with or immediately following a meal.   pantoprazole (PROTONIX) 20 MG tablet Take 20 mg by mouth daily.     Allergies:   Budesonide   Social History   Socioeconomic History   Marital status: Single    Spouse name: Not on file   Number of children: Not on file   Years of education: Not on file   Highest education level: Not on file  Occupational History   Not on file  Tobacco Use   Smoking status: Former    Packs/day: 1.50    Years: 30.00    Additional pack years: 0.00    Total pack years: 45.00    Types: Cigarettes   Smokeless tobacco: Never  Vaping Use   Vaping Use: Never used  Substance and Sexual Activity   Alcohol use: Not Currently   Drug use: Never   Sexual activity: Not Currently  Other Topics Concern   Not on file  Social History Narrative   Not on file   Social Determinants of Health   Financial Resource Strain: Not on file  Food Insecurity: Not on file  Transportation Needs: Not on file  Physical Activity: Not on file  Stress: Not on file  Social Connections: Not on file     Family History: The patient's family history includes Premature CHD in her father.  ROS:   As per HPI   EKGs/Labs/Other Studies Reviewed:    The following studies were reviewed today:  Cardiac Studies & Procedures   CARDIAC CATHETERIZATION  CARDIAC CATHETERIZATION  09/01/2021  Narrative Conclusions: Severe three-vessel coronary artery disease, as detailed below. Mildly elevated left ventricular filling pressure.  Recommendations: Cardiac surgery consultation for CABG. Restart IV heparin 2 hours after TR band removal. Start isosorbide mononitrate and wean off nitroglycerin infusion, as tolerated. Aggressive secondary prevention of coronary artery disease.  Yvonne Kendall, MD [facility]  Findings Coronary Findings Diagnostic  Dominance: Right  Left Main Vessel is large. Dist LM lesion is 50% stenosed.  Left Anterior Descending Prox LAD to Mid LAD lesion is 50% stenosed. Mid LAD-1 lesion is 90% stenosed. Mid LAD-2 lesion is 50% stenosed.  First Diagonal Branch Vessel is moderate in size. 1st Diag lesion is 20% stenosed.  Second Diagonal Branch Vessel is moderate in size.  Left Circumflex Vessel is moderate in size. Ost Cx to Prox Cx lesion is 70% stenosed. Dist Cx lesion is 20% stenosed.  First Obtuse Marginal Branch Vessel is small in size.  Second Obtuse Marginal Branch Vessel is  moderate in size.  Right Coronary Artery Vessel is large. Prox RCA to Mid RCA lesion is 90% stenosed. The lesion is irregular. Dist RCA lesion is 50% stenosed.  Right Posterior Descending Artery Vessel is small in size.  Right Posterior Atrioventricular Artery Vessel is small in size. RPAV lesion is 40% stenosed.  First Right Posterolateral Branch Vessel is small in size.  Second Right Posterolateral Branch Vessel is small in size.  Intervention  No interventions have been documented.     ECHOCARDIOGRAM  ECHOCARDIOGRAM LIMITED 03/17/2023  Narrative ECHOCARDIOGRAM LIMITED REPORT    Patient Name:   [name] [name] Date of Exam: 03/17/2023 Medical Rec #:  [id number]         Height:       63.0 in Accession #:    [id number]        Weight:       146.6 lb Date of Birth:  1945-12-27         BSA:          1.695 m Patient Age:     77 years          BP:           190/113 mmHg Patient Gender: F                 HR:           71 bpm. Exam Location:  [address]  Procedure: 2D Echo, Cardiac Doppler and Color Doppler  Indications:    R06.02 SOB  History:        Patient has prior history of Echocardiogram examinations, most recent 09/08/2021. Previous Myocardial Infarction and CAD, Prior CABG; Risk Factors:Hypertension, Dyslipidemia, Former Smoker and Family History of Coronary Artery Disease. Hyperkalemia. Elevated BNP. Anxiety. Anemia.  Sonographer:    Cathie Beams RCS Referring Phys: [name]   Sonographer Comments: Patient's BP is 190/113. Patient stated that her blood pressure is consistently elevated at appointments and that her cardiologist is aware of her elevated blood pressure readings. Prior BP readings noted in chart are: 09/04/2022 - 178/108 and 02/11/2023 - 188/111. IMPRESSIONS   1. Left ventricular ejection fraction, by estimation, is 60 to 65%. The left ventricle has normal function. Left ventricular diastolic parameters are consistent with Grade I diastolic dysfunction (impaired relaxation). 2. Right ventricular systolic function is normal. The right ventricular size is normal. Tricuspid regurgitation signal is inadequate for assessing PA pressure. 3. The mitral valve is grossly normal. Trivial mitral valve regurgitation. 4. The aortic valve is tricuspid. There is mild calcification of the aortic valve. There is mild thickening of the aortic valve. Aortic valve regurgitation is not visualized. Aortic valve sclerosis/calcification is present, without any evidence of aortic stenosis. 5. Aortic dilatation noted. There is mild dilatation of the ascending aorta, measuring 41 mm. 6. The inferior vena cava is normal in size with greater than 50% respiratory variability, suggesting right atrial pressure of 3 mmHg.  Comparison(s): No significant change from prior study.  FINDINGS Left  Ventricle: Left ventricular ejection fraction, by estimation, is 60 to 65%. The left ventricle has normal function. The left ventricular internal cavity size was normal in size. There is no left ventricular hypertrophy. Left ventricular diastolic parameters are consistent with Grade I diastolic dysfunction (impaired relaxation).  Right Ventricle: The right ventricular size is normal. No increase in right ventricular wall thickness. Right ventricular systolic function is normal. Tricuspid regurgitation signal is inadequate for assessing PA pressure.  Left Atrium: Left atrial size  was normal in size.  Right Atrium: Right atrial size was normal in size.  Pericardium: There is no evidence of pericardial effusion.  Mitral Valve: The mitral valve is grossly normal. Trivial mitral valve regurgitation.  Tricuspid Valve: The tricuspid valve is normal in structure. Tricuspid valve regurgitation is trivial.  Aortic Valve: The aortic valve is tricuspid. There is mild calcification of the aortic valve. There is mild thickening of the aortic valve. Aortic valve regurgitation is not visualized. Aortic valve sclerosis/calcification is present, without any evidence of aortic stenosis.  Pulmonic Valve: The pulmonic valve was normal in structure. Pulmonic valve regurgitation is trivial.  Aorta: Aortic dilatation noted. There is mild dilatation of the ascending aorta, measuring 41 mm.  Venous: The inferior vena cava is normal in size with greater than 50% respiratory variability, suggesting right atrial pressure of 3 mmHg.  IAS/Shunts: The atrial septum is grossly normal.  LEFT VENTRICLE PLAX 2D LVIDd:         3.40 cm   Diastology LVIDs:         2.00 cm   LV e' medial:    5.87 cm/s LV PW:         1.00 cm   LV E/e' medial:  13.4 LV IVS:        1.30 cm   LV e' lateral:   12.10 cm/s LVOT diam:     1.90 cm   LV E/e' lateral: 6.5 LV SV:         72 LV SV Index:   42 LVOT Area:     2.84 cm   RIGHT  VENTRICLE RV Basal diam:  2.80 cm RV S prime:     9.90 cm/s TAPSE (M-mode): 1.5 cm  LEFT ATRIUM             Index        RIGHT ATRIUM          Index LA diam:        3.20 cm 1.89 cm/m   RA Area:     9.25 cm LA Vol (A2C):   30.0 ml 17.70 ml/m  RA Volume:   14.30 ml 8.44 ml/m LA Vol (A4C):   32.0 ml 18.88 ml/m LA Biplane Vol: 33.3 ml 19.65 ml/m AORTIC VALVE LVOT Vmax:   103.00 cm/s LVOT Vmean:  69.400 cm/s LVOT VTI:    0.253 m  AORTA Ao Root diam: 3.10 cm Ao Asc diam:  4.10 cm  MITRAL VALVE MV Area (PHT): 4.04 cm     SHUNTS MV Decel Time: 188 msec     Systemic VTI:  0.25 m MV E velocity: 78.60 cm/s   Systemic Diam: 1.90 cm MV A velocity: 108.00 cm/s MV E/A ratio:  0.73  Laurance Flatten MD Electronically signed by Laurance Flatten MD Signature Date/Time: 03/17/2023/10:51:48 AM    Final   TEE  ECHO INTRAOPERATIVE TEE 09/08/2021  Narrative *INTRAOPERATIVE TRANSESOPHAGEAL REPORT *    Patient Name:   [name] [name] Date of Exam: 09/08/2021 Medical Rec #:  [id number]         Height:       63.0 in Accession #:    [id number]        Weight:       126.8 lb Date of Birth:  [birth year]         BSA:          1.59 m Patient Age:    70 years  BP:           125/69 mmHg Patient Gender: F                 HR:           56 bpm. Exam Location:  Anesthesiology  Transesophogeal exam was perform intraoperatively during surgical procedure. Patient was closely monitored under general anesthesia during the entirety of examination.  Indications:     Coronary Artery Disease Sonographer:     Eulah Pont RDCS Performing Phys: Karna Christmas MD Diagnosing Phys: Karna Christmas MD  Complications: No known complications during this procedure. POST-OP IMPRESSIONS Overall, there were no significant changes from pre-bypass. _ Left Ventricle: The left ventricle is unchanged from pre-bypass. _ Right Ventricle: The right ventricle appears unchanged from pre-bypass. _ Aorta:  The aorta appears unchanged from pre-bypass. _ Left Atrium: The left atrium appears unchanged from pre-bypass. _ Left Atrial Appendage: The left atrial appendage appears unchanged from pre-bypass. _ Aortic Valve: The aortic valve appears unchanged from pre-bypass. _ Mitral Valve: The mitral valve appears unchanged from pre-bypass. _ Tricuspid Valve: The tricuspid valve appears unchanged from pre-bypass. _ Pulmonic Valve: The pulmonic valve appears unchanged from pre-bypass. _ Interatrial Septum: The interatrial septum appears unchanged from pre-bypass. _ Interventricular Septum: The interventricular septum appears unchanged from pre-bypass. _ Pericardium: The pericardium appears unchanged from pre-bypass.  PRE-OP FINDINGS Left Ventricle: The left ventricle has hyperdynamic systolic function, with an ejection fraction of >65%. The cavity size was normal. There is moderately increased left ventricular wall thickness. There is moderate concentric left ventricular hypertrophy.  Right Ventricle: The right ventricle has normal systolic function. The cavity was normal. There is no increase in right ventricular wall thickness.  Left Atrium: Left atrial size was normal in size. No left atrial/left atrial appendage thrombus was detected.  Right Atrium: Right atrial size was normal in size.  Interatrial Septum: No atrial level shunt detected by color flow Doppler.  Pericardium: There is no evidence of pericardial effusion.  Mitral Valve: The mitral valve is normal in structure. Mitral valve regurgitation is not visualized by color flow Doppler.  Tricuspid Valve: The tricuspid valve was normal in structure. Tricuspid valve regurgitation is trivial by color flow Doppler.  Aortic Valve: The aortic valve is normal in structure. Aortic valve regurgitation was not visualized by color flow Doppler. There is no stenosis of the aortic valve.  Pulmonic Valve: The pulmonic valve was normal in structure.  Pulmonic valve regurgitation is not visualized by color flow Doppler.   Aorta: The aortic root, ascending aorta and aortic arch are normal in size and structure. There is evidence of plaque in the descending aorta.   Karna Christmas MD Electronically signed by Karna Christmas MD Signature Date/Time: 09/08/2021/12:28:29 PM    Final             EKG: No new tracing  Recent Labs: 02/11/2023: ALT 49; BUN 27; Creatinine, Ser 1.02; Potassium 5.8; Sodium 141   Recent Lipid Panel    Component Value Date/Time   CHOL 205 (H) 08/31/2021 0052   TRIG 54 08/31/2021 0052   HDL 49 08/31/2021 0052   CHOLHDL 4.2 08/31/2021 0052   VLDL 11 08/31/2021 0052   LDLCALC 145 (H) 08/31/2021 0052     Risk Assessment/Calculations:           Physical Exam:    VS:  BP (!) 184/100   Pulse 71   Ht 5\' 3"  (1.6 m)   Wt 145 lb 9.6  oz (66 kg)   SpO2 98%   BMI 25.79 kg/m     Wt Readings from Last 3 Encounters:  03/24/23 145 lb 9.6 oz (66 kg)  02/11/23 146 lb 9.6 oz (66.5 kg)  09/04/22 143 lb (64.9 kg)     GEN: Well nourished, well developed in no acute distress HEENT: Normal NECK: No JVD; No carotid bruits CARDIAC: RRR, no murmurs RESPIRATORY:  CTAB, no wheezes ABDOMEN: Soft, non-tender, non-distended MUSCULOSKELETAL:  No edema, warm SKIN: Warm and dry NEUROLOGIC:  Alert and oriented x 3 PSYCHIATRIC:  Anxious  ASSESSMENT:    1. Coronary artery disease involving native coronary artery of native heart with other form of angina pectoris (HCC)   2. Hx of CABG   3. Essential hypertension   4. Primary hypertension   5. Anxiety   6. Mild carotid artery disease (HCC)   7. Hyperlipidemia LDL goal <70     PLAN:    In order of problems listed above:  #Multivessel CAD s/p CABGx4: #History of NSTEMI: Patient admitted with non-STEMI in 08/2021 found to have three-vessel CAD. Status post CABG x4 on 09/08/2021. LIMA was determined to be poor candidate for revascularization at time of surgery.   She underwent SVG to LAD, SVG to RCA, SVG to OM and OM 2. Pre-op TTE with LVEF 70-75%. Most recent TTE with EF 60-65%. -Continue ASA 81mg  daily -Stopped plavix as completed 1 year of therapy -Continue metop 75mg  XL daily -Stopped losartan due to high K -Continue zetia 10mg  daily -Declined PCSK9i after multiple attempts to start   #Carotid artery disease -Right ICA 40-59%, left ICA 1-39%. -Continue ASA as above -Continue zetia 10mg  daily -Declined PCSK9i as above -Continue annual monitoring   #Hypertension Difficult to monitor due to significant anxiety with taking blood pressure and white coat hypertension. Discussed how treating her anxiety may help symptoms and improve blood pressure readings. She did not tolerate losartan due to hyper K and declined starting hydrochlorothiazide today.  Okay for Dr. Hyacinth Meeker to manage BP going forward to help streamline care as she is able to see patient more frequently. -Stopped losartan due to high K -Continue metop 75mg  XL daily -Continue amlodipine 10mg  daily -Declined starting HCTZ -Management of anxiety per PCP   #Autoimmune hepatitis -On azathioprine -Follows at [facility]   #Hyperlipidemia Not statin due to autoimmune hepatitis.   -Continue zetia 10mg  daily -Declined PCSK9i as detailed above  #History of Tobacco Abuse: -Successfully quit  #Anxiety: Patient admits to significant anxiety which has been difficult to manage. Follows with PCP.     Follow-up: 6 months  Medication Adjustments/Labs and Tests Ordered: Current medicines are reviewed at length with the patient today.  Concerns regarding medicines are outlined above.   No orders of the defined types were placed in this encounter.  No orders of the defined types were placed in this encounter.  Patient Instructions  Medication Instructions:   Your physician recommends that you continue on your current medications as directed. Please refer to the Current Medication list given to  you today.  *If you need a refill on your cardiac medications before your next appointment, please call your pharmacy*    Follow-Up: At [facility], you and your health needs are our priority.  As part of our continuing mission to provide you with exceptional heart care, we have created designated Provider Care Teams.  These Care Teams include your primary Cardiologist (physician) and Advanced Practice Providers (APPs -  Physician Assistants and Nurse Practitioners)  who all work together to provide you with the care you need, when you need it.  We recommend signing up for the patient portal called "MyChart".  Sign up information is provided on this After Visit Summary.  MyChart is used to connect with patients for Virtual Visits (Telemedicine).  Patients are able to view lab/test results, encounter notes, upcoming appointments, etc.  Non-urgent messages can be sent to your provider as well.   To learn more about what you can do with MyChart, go to [email or web address].    Your next appointment:   6 month(s)  Provider:   Dr. Eldridge Dace       Signed, [name], MD  03/24/2023 9:49 AM    [facility]

## 2023-03-23 DIAGNOSIS — Z0000 Encounter for general adult medical examination without abnormal findings: Secondary | ICD-10-CM | POA: Diagnosis not present

## 2023-03-23 DIAGNOSIS — I6523 Occlusion and stenosis of bilateral carotid arteries: Secondary | ICD-10-CM | POA: Diagnosis not present

## 2023-03-23 DIAGNOSIS — J439 Emphysema, unspecified: Secondary | ICD-10-CM | POA: Diagnosis not present

## 2023-03-23 DIAGNOSIS — I700 Atherosclerosis of aorta: Secondary | ICD-10-CM | POA: Diagnosis not present

## 2023-03-23 DIAGNOSIS — Z1389 Encounter for screening for other disorder: Secondary | ICD-10-CM | POA: Diagnosis not present

## 2023-03-23 DIAGNOSIS — I10 Essential (primary) hypertension: Secondary | ICD-10-CM | POA: Diagnosis not present

## 2023-03-23 DIAGNOSIS — K754 Autoimmune hepatitis: Secondary | ICD-10-CM | POA: Diagnosis not present

## 2023-03-23 DIAGNOSIS — K219 Gastro-esophageal reflux disease without esophagitis: Secondary | ICD-10-CM | POA: Diagnosis not present

## 2023-03-23 DIAGNOSIS — I77810 Thoracic aortic ectasia: Secondary | ICD-10-CM | POA: Diagnosis not present

## 2023-03-23 DIAGNOSIS — I2510 Atherosclerotic heart disease of native coronary artery without angina pectoris: Secondary | ICD-10-CM | POA: Diagnosis not present

## 2023-03-23 DIAGNOSIS — I517 Cardiomegaly: Secondary | ICD-10-CM | POA: Diagnosis not present

## 2023-03-24 ENCOUNTER — Ambulatory Visit: Payer: Medicare HMO | Attending: Cardiology | Admitting: Cardiology

## 2023-03-24 ENCOUNTER — Encounter: Payer: Self-pay | Admitting: Cardiology

## 2023-03-24 VITALS — BP 184/100 | HR 71 | Ht 63.0 in | Wt 145.6 lb

## 2023-03-24 DIAGNOSIS — I10 Essential (primary) hypertension: Secondary | ICD-10-CM | POA: Diagnosis not present

## 2023-03-24 DIAGNOSIS — Z951 Presence of aortocoronary bypass graft: Secondary | ICD-10-CM | POA: Diagnosis not present

## 2023-03-24 DIAGNOSIS — I779 Disorder of arteries and arterioles, unspecified: Secondary | ICD-10-CM

## 2023-03-24 DIAGNOSIS — F419 Anxiety disorder, unspecified: Secondary | ICD-10-CM

## 2023-03-24 DIAGNOSIS — I25118 Atherosclerotic heart disease of native coronary artery with other forms of angina pectoris: Secondary | ICD-10-CM

## 2023-03-24 DIAGNOSIS — E785 Hyperlipidemia, unspecified: Secondary | ICD-10-CM

## 2023-03-24 NOTE — Patient Instructions (Signed)
Medication Instructions:   Your physician recommends that you continue on your current medications as directed. Please refer to the Current Medication list given to you today.  *If you need a refill on your cardiac medications before your next appointment, please call your pharmacy*    Follow-Up: At [facility], you and your health needs are our priority.  As part of our continuing mission to provide you with exceptional heart care, we have created designated Provider Care Teams.  These Care Teams include your primary Cardiologist (physician) and Advanced Practice Providers (APPs -  Physician Assistants and Nurse Practitioners) who all work together to provide you with the care you need, when you need it.  We recommend signing up for the patient portal called "MyChart".  Sign up information is provided on this After Visit Summary.  MyChart is used to connect with patients for Virtual Visits (Telemedicine).  Patients are able to view lab/test results, encounter notes, upcoming appointments, etc.  Non-urgent messages can be sent to your provider as well.   To learn more about what you can do with MyChart, go to [email or web address].    Your next appointment:   6 month(s)  Provider:   Dr. Eldridge Dace

## 2023-04-21 DIAGNOSIS — R635 Abnormal weight gain: Secondary | ICD-10-CM | POA: Diagnosis not present

## 2023-04-21 DIAGNOSIS — I10 Essential (primary) hypertension: Secondary | ICD-10-CM | POA: Diagnosis not present

## 2023-04-21 DIAGNOSIS — R946 Abnormal results of thyroid function studies: Secondary | ICD-10-CM | POA: Diagnosis not present

## 2023-04-21 DIAGNOSIS — F419 Anxiety disorder, unspecified: Secondary | ICD-10-CM | POA: Diagnosis not present

## 2023-07-05 ENCOUNTER — Telehealth: Payer: Self-pay | Admitting: Nurse Practitioner

## 2023-07-05 DIAGNOSIS — Z951 Presence of aortocoronary bypass graft: Secondary | ICD-10-CM

## 2023-07-05 DIAGNOSIS — I25118 Atherosclerotic heart disease of native coronary artery with other forms of angina pectoris: Secondary | ICD-10-CM

## 2023-07-05 DIAGNOSIS — R079 Chest pain, unspecified: Secondary | ICD-10-CM

## 2023-07-05 DIAGNOSIS — Z8639 Personal history of other endocrine, nutritional and metabolic disease: Secondary | ICD-10-CM

## 2023-07-05 NOTE — Telephone Encounter (Signed)
Called patient about message. Patient is concerned that she might behaving issues with her CAD or it could be GERD. Patient has been having nausea for the past 2 days. Patient stated when she had her last heart attack she had symptoms of GERD. Patient would like to get it check out. Will have patient see DOD tomorrow.

## 2023-07-05 NOTE — Addendum Note (Signed)
Addended by: Virl Axe, Jaylynne Birkhead L on: 07/05/2023 05:54 PM   Modules accepted: Orders

## 2023-07-05 NOTE — Telephone Encounter (Addendum)
Placed order for troponin and CMET, lipid panel, and L (a) per Dr. Lynnette Caffey tomorrow morning before her appointment.

## 2023-07-05 NOTE — Progress Notes (Unsigned)
Cardiology Office Note:   Date:  07/06/2023  ID:  [name] [name], [name] [name] [birth year], MRN [id number] PCP:  Sigmund Hazel, MD  [facility] Providers Cardiologist:  Alverda Skeans, MD Referring MD: Sigmund Hazel, MD  Chief Complaint/Reason for Referral: DOD visit for chest pain ASSESSMENT:    1. Precordial pain   2. S/P CABG x 4   3. Hyperlipidemia LDL goal <70   4. Essential hypertension   5. Aortic atherosclerosis (HCC)   6. Autoimmune hepatitis (HCC)     PLAN:   In order of problems listed above: Chest pain: We will obtain an echocardiogram to evaluate further.  Her symptoms are consistent with GERD as they have not recurred after she has started taking her Protonix and responded promptly to Tums.  She walks regularly without any exertional angina so I do not think a stress test is needed at this point.  Her troponin was negative today and her EKG is unchanged. Status post CABG: Aspirin, Zetia, and blood pressure control. Hyperlipidemia: Not on statin due to autoimmune hepatitis; continue Zetia, will need lipids checked later this year  Hypertension: Her blood pressures at home are well-controlled however her blood pressures on multiple measures here are above goal.  We will follow-up on her laboratories and may start Micardis if her hyperkalemia has resolved and we will need to check more blood work if that is the case after restarting an ARB.  I will refer her for an amatory blood pressure monitor. Hyperkalemia: Losartan was discontinued and amlodipine started; follow-up labs see #4 above. Aortic atherosclerosis: Continue aspirin, Zetia, and blood pressure control. Autoimmune hepatitis: Followed at [facility]; avoiding statins for now. COPD: Has chronic shortness of breath that is unchanged.           Dispo:  Return in about 3 months (around 10/06/2023).      Medication Adjustments/Labs and Tests Ordered: Current medicines are reviewed at length with the patient today.  Concerns  regarding medicines are outlined above.  The following changes have been made:  no change   Labs/tests ordered: Orders Placed This Encounter  Procedures   EKG 12-Lead   AMB BP MONITORING   ECHOCARDIOGRAM COMPLETE    Medication Changes: No orders of the defined types were placed in this encounter.   Current medicines are reviewed at length with the patient today.  The patient does not have concerns regarding medicines.  History of Present Illness:      FOCUSED PROBLEM LIST:   Acute coronary syndrome 2022 CABG x 4 with vein graft to LAD, vein graft to RCA, sequential vein graft to obtuse marginal 1 and obtuse marginal 2 Hyperlipidemia Not on statin due to autoimmune hepatitis Hypertension GERD Aortic atherosclerosis on chest x-ray 2022 Autoimmune hepatitis with stage II fibrosis; followed at [facility] Status post azathioprine and budesonide therapy discontinued March 2020 COPD  The patient is a 77 year old female with the above listed medical problems who is being seen in expedited fashion due to nausea and chest discomfort.  The patient contacted our office yesterday with these complaints that have been going on for the last few days.  She is unsure whether they are related to her stomach or her heart and for this reason she is being seen today in an expedited fashion.  Of note last month the patient had a potassium level that was quite high; losartan was stopped and amlodipine was started.  We arranged for her to have a troponin checked this morning and it was not elevated (  troponin T 7ng/L with normal being 0-14ng/L).  The patient tells me that over the weekend she was at the mall and she bent over when she stood up she developed a sharp pain across her chest.  She took a Tums which promptly relieved the discomfort.  She had not been taking the PPI that had been prescribed to her on a routine basis.  Since that episode she has been on the PPI and has had no recurrence of chest  discomfort.  She walks on a routine basis in the park for exercise.  She denies any exertional angina.  She does have some chronic shortness of breath which presents itself after walking for about 2 miles.        Current Medications: Current Meds  Medication Sig   amLODipine (NORVASC) 5 MG tablet Take 5 mg by mouth daily.   aspirin EC 81 MG tablet Take 1 tablet (81 mg total) by mouth daily. Swallow whole.   ezetimibe (ZETIA) 10 MG tablet Take 1 tablet (10 mg total) by mouth daily.   metoprolol succinate (TOPROL-XL) 50 MG 24 hr tablet Take 50 mg by mouth daily. Take with or immediately following a meal.   pantoprazole (PROTONIX) 20 MG tablet Take 20 mg by mouth daily.     Review of Systems:   Please see the history of present illness.    All other systems reviewed and are negative.     EKGs/Labs/Other Test Reviewed:   EKG: EKG today as detailed above demonstrates no significant change versus prior  EKG Interpretation Date/Time:  Tuesday July 06 2023 16:43:53 EDT Ventricular Rate:  68 PR Interval:  166 QRS Duration:  70 QT Interval:  410 QTC Calculation: 435 R Axis:   -17  Text Interpretation: Normal sinus rhythm Inferior infarct , age undetermined When compared with ECG of 19-Sep-2021 13:55, Premature ventricular complexes no longer present Confirmed by Alverda Skeans ([id number]) on 07/06/2023 4:50:40 PM         Risk Assessment/Calculations:          Physical Exam:   VS:  BP (!) 145/80   Pulse 68   Ht 5\' 3"  (1.6 m)   Wt 146 lb (66.2 kg)   LMP  (LMP Unknown)   SpO2 96%   BMI 25.86 kg/m    HYPERTENSION CONTROL Vitals:   07/06/23 1647 07/06/23 1717  BP: (!) 140/98 (!) 145/80    The patient's blood pressure is elevated above target today. {Click here if intervention needs to be changed Refresh Note :1}  In order to address the patient's elevated BP: Labs and/or other diagnostics are currently pending prior to making blood pressure medication adjustments.      Wt  Readings from Last 3 Encounters:  07/06/23 146 lb (66.2 kg)  03/24/23 145 lb 9.6 oz (66 kg)  02/11/23 146 lb 9.6 oz (66.5 kg)      GENERAL:  No apparent distress, AOx3 HEENT:  No carotid bruits, +2 carotid impulses, no scleral icterus CAR: RRR no murmurs, gallops, rubs, or thrills RES:  Clear to auscultation bilaterally ABD:  Soft, nontender, nondistended, positive bowel sounds x 4 VASC:  +2 radial pulses, +2 carotid pulses NEURO:  CN 2-12 grossly intact; motor and sensory grossly intact PSYCH:  No active depression or anxiety EXT:  No edema, ecchymosis, or cyanosis  Signed, Orbie Pyo, MD  07/06/2023 5:18 PM    [facility] [address] [place], [place], [place]  [address] Phone: [phone]; Fax: [phone]   Note:  This document was prepared using Dragon voice recognition software and may include unintentional dictation errors.

## 2023-07-05 NOTE — Telephone Encounter (Signed)
  Pt said, she's been feeling nauseous for the last few days. She is not sure if this is from her heart or indigestion. She would like to know if she need to see a provider or change her medications

## 2023-07-06 ENCOUNTER — Ambulatory Visit: Payer: Medicare HMO | Attending: Internal Medicine | Admitting: Internal Medicine

## 2023-07-06 ENCOUNTER — Other Ambulatory Visit: Payer: Self-pay | Admitting: *Deleted

## 2023-07-06 ENCOUNTER — Other Ambulatory Visit: Payer: Self-pay | Admitting: Internal Medicine

## 2023-07-06 ENCOUNTER — Other Ambulatory Visit: Payer: Self-pay

## 2023-07-06 ENCOUNTER — Encounter: Payer: Self-pay | Admitting: Internal Medicine

## 2023-07-06 ENCOUNTER — Ambulatory Visit: Payer: Medicare HMO

## 2023-07-06 VITALS — BP 145/90 | HR 68 | Ht 63.0 in | Wt 146.0 lb

## 2023-07-06 DIAGNOSIS — E785 Hyperlipidemia, unspecified: Secondary | ICD-10-CM

## 2023-07-06 DIAGNOSIS — I10 Essential (primary) hypertension: Secondary | ICD-10-CM | POA: Diagnosis not present

## 2023-07-06 DIAGNOSIS — Z951 Presence of aortocoronary bypass graft: Secondary | ICD-10-CM

## 2023-07-06 DIAGNOSIS — I25118 Atherosclerotic heart disease of native coronary artery with other forms of angina pectoris: Secondary | ICD-10-CM | POA: Diagnosis not present

## 2023-07-06 DIAGNOSIS — K754 Autoimmune hepatitis: Secondary | ICD-10-CM

## 2023-07-06 DIAGNOSIS — R072 Precordial pain: Secondary | ICD-10-CM

## 2023-07-06 DIAGNOSIS — I700 Atherosclerosis of aorta: Secondary | ICD-10-CM

## 2023-07-06 DIAGNOSIS — R079 Chest pain, unspecified: Secondary | ICD-10-CM

## 2023-07-06 DIAGNOSIS — Z8639 Personal history of other endocrine, nutritional and metabolic disease: Secondary | ICD-10-CM | POA: Diagnosis not present

## 2023-07-06 LAB — TROPONIN T: Troponin T (Highly Sensitive): 7 ng/L (ref 0–14)

## 2023-07-06 NOTE — Patient Instructions (Addendum)
Medication Instructions:  Your physician recommends that you continue on your current medications as directed. Please refer to the Current Medication list given to you today.  *If you need a refill on your cardiac medications before your next appointment, please call your pharmacy*   Lab Work: NONE If you have labs (blood work) drawn today and your tests are completely normal, you will receive your results only by: MyChart Message (if you have MyChart) OR A paper copy in the mail If you have any lab test that is abnormal or we need to change your treatment, we will call you to review the results.   Testing/Procedures: Your physician has requested that you have an echocardiogram. Echocardiography is a painless test that uses sound waves to create images of your heart. It provides your doctor with information about the size and shape of your heart and how well your heart's chambers and valves are working. This procedure takes approximately one hour. There are no restrictions for this procedure. Please do NOT wear cologne, perfume, aftershave, or lotions (deodorant is allowed). Please arrive 15 minutes prior to your appointment time.  Your physician has requested that you wear a 24 hour BP monitor.   Follow-Up:As scheduled  At [facility], you and your health needs are our priority.  As part of our continuing mission to provide you with exceptional heart care, we have created designated Provider Care Teams.  These Care Teams include your primary Cardiologist (physician) and Advanced Practice Providers (APPs -  Physician Assistants and Nurse Practitioners) who all work together to provide you with the care you need, when you need it.

## 2023-07-07 ENCOUNTER — Telehealth: Payer: Self-pay | Admitting: Internal Medicine

## 2023-07-07 ENCOUNTER — Other Ambulatory Visit: Payer: Self-pay | Admitting: *Deleted

## 2023-07-07 DIAGNOSIS — I10 Essential (primary) hypertension: Secondary | ICD-10-CM

## 2023-07-07 LAB — LIPOPROTEIN A (LPA): Lipoprotein (a): 17.1 nmol/L (ref <75.0)

## 2023-07-07 LAB — COMPREHENSIVE METABOLIC PANEL
ALT: 27 [IU]/L (ref 0–32)
AST: 30 [IU]/L (ref 0–40)
Albumin: 4.4 g/dL (ref 3.8–4.8)
Alkaline Phosphatase: 57 [IU]/L (ref 44–121)
BUN/Creatinine Ratio: 29 — ABNORMAL HIGH (ref 12–28)
BUN: 27 mg/dL (ref 8–27)
Bilirubin Total: 0.3 mg/dL (ref 0.0–1.2)
CO2: 24 mmol/L (ref 20–29)
Calcium: 9.6 mg/dL (ref 8.7–10.3)
Chloride: 102 mmol/L (ref 96–106)
Creatinine, Ser: 0.92 mg/dL (ref 0.57–1.00)
Globulin, Total: 3 g/dL (ref 1.5–4.5)
Glucose: 95 mg/dL (ref 70–99)
Potassium: 4.9 mmol/L (ref 3.5–5.2)
Sodium: 139 mmol/L (ref 134–144)
Total Protein: 7.4 g/dL (ref 6.0–8.5)
eGFR: 65 mL/min/{1.73_m2} (ref >59)

## 2023-07-07 LAB — LIPID PANEL
Chol/HDL Ratio: 3.4 {ratio} (ref 0.0–4.4)
Cholesterol, Total: 180 mg/dL (ref 100–199)
HDL: 53 mg/dL (ref >39)
LDL Chol Calc (NIH): 116 mg/dL — ABNORMAL HIGH (ref 0–99)
Triglycerides: 59 mg/dL (ref 0–149)
VLDL Cholesterol Cal: 11 mg/dL (ref 5–40)

## 2023-07-07 NOTE — Telephone Encounter (Signed)
  Pt would like to know how much her AMB BP monitoring cost before she use it. Also, she mentioned, she tried something like this before (3 years) and because she gets anxious every time her BP being check its always elevated and she is concern it might not get an accurate reading. She also mentioned that next week she will be seeing her pcp and has been advised to bring her blood pressure machine for accuracy assessment.

## 2023-07-07 NOTE — Telephone Encounter (Signed)
Orbie Pyo, MD  You; Yates Decamp, Arvid Right, RN7 minutes ago (1:53 PM)   AT Correct.  She tells me her blood pressures at home are normal but her blood pressures in the office are elevated consistent with whitecoat hypertension.  Additionally I want a make sure she does not have pseudo resistant hypertension due to incorrect blood pressure readings.  That is why I want the ambulatory monitor.    Wells, Shelly A  McCallum, Shamea N, RN26 minutes ago (1:33 PM)    Hi Shamea, This lady is calling to inquire about the cost of a 24 hour ambulatory blood pressure monitor.  The cost would be $308.  If her insurance does not cover it.  Weird thing, there is an active request, but there is no actual order entered in EPIC.  Dx code of I10 Essential Hypertension is not a covered code for this test. This test is used to diagnose Htn or White Coat syndrome, not to evaluate blood pressure responses to treatment.  Please inform the provider and enter an order if he still wants it.  If dx isn't changed, please inform patient $308 will be out of pocket. 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

## 2023-07-08 NOTE — Telephone Encounter (Signed)
Anner Crete, Lorane Gell, RN  I figured out why we don't see it.  The order has to be entered as CAR01 ambulatory blood pressure monitor.  AMB BP Monitoring doesn't work.  That might be a test done somewhere else, like hypertension clinic, that monitors for a [place] time _________________________________________________________  I changed the order to CAR01.  The patient is planning to ask Dr. Hyacinth Meeker because wearing it while she sleeps is a concern to her because she has a cat and when it pumps up her cat who sleeps with her will poke a hole in it.    She is still waiting to hear what the cost is.

## 2023-07-09 ENCOUNTER — Telehealth: Payer: Self-pay

## 2023-07-09 NOTE — Telephone Encounter (Signed)
Spoke with patient, DR Lynnette Caffey wanted to increase patients amlodipine to 10 mg daily but patient states she has already tried this in the past but had to decrease to 5 mg due to ankle swelling and hypotension with dizziness. Patient states she would prefer one provider to manage her blood pressure issues and is scheduled to see her PCP, Dr Hyacinth Meeker, next week who has been managing this for her. Patient states she will discuss other blood pressure options with her PCP next week. Patient states she didn't want to upset Dr Lynnette Caffey but wanted to ensure that one provider was managing this. Patient will reach out to our office for any further needs.   amlodipine Received: Arsenio Katz, Charlies Constable, MD  P Cv Div [facility] St Triage Have patient increase to 10mg  for BP control.

## 2023-07-26 ENCOUNTER — Ambulatory Visit (HOSPITAL_COMMUNITY): Payer: Medicare HMO | Attending: Internal Medicine

## 2023-07-26 DIAGNOSIS — Z951 Presence of aortocoronary bypass graft: Secondary | ICD-10-CM | POA: Insufficient documentation

## 2023-07-26 DIAGNOSIS — R072 Precordial pain: Secondary | ICD-10-CM | POA: Insufficient documentation

## 2023-07-26 DIAGNOSIS — I10 Essential (primary) hypertension: Secondary | ICD-10-CM | POA: Insufficient documentation

## 2023-07-26 LAB — ECHOCARDIOGRAM COMPLETE
Area-P 1/2: 3.6 cm2
S' Lateral: 1.9 cm

## 2023-08-23 DIAGNOSIS — K219 Gastro-esophageal reflux disease without esophagitis: Secondary | ICD-10-CM | POA: Diagnosis not present

## 2023-08-23 DIAGNOSIS — F419 Anxiety disorder, unspecified: Secondary | ICD-10-CM | POA: Diagnosis not present

## 2023-08-23 DIAGNOSIS — I10 Essential (primary) hypertension: Secondary | ICD-10-CM | POA: Diagnosis not present

## 2023-08-23 DIAGNOSIS — E7800 Pure hypercholesterolemia, unspecified: Secondary | ICD-10-CM | POA: Diagnosis not present

## 2023-09-01 ENCOUNTER — Ambulatory Visit (HOSPITAL_COMMUNITY)
Admission: RE | Admit: 2023-09-01 | Discharge: 2023-09-01 | Disposition: A | Discharge status: Home / Self Care | Payer: Medicare HMO | Source: Ambulatory Visit | Attending: Cardiovascular Disease | Admitting: Cardiovascular Disease

## 2023-09-01 DIAGNOSIS — I779 Disorder of arteries and arterioles, unspecified: Secondary | ICD-10-CM | POA: Diagnosis not present

## 2023-09-01 DIAGNOSIS — I6521 Occlusion and stenosis of right carotid artery: Secondary | ICD-10-CM

## 2023-09-01 DIAGNOSIS — M545 Low back pain, unspecified: Secondary | ICD-10-CM | POA: Diagnosis not present

## 2023-09-07 DIAGNOSIS — M546 Pain in thoracic spine: Secondary | ICD-10-CM | POA: Diagnosis not present

## 2023-09-13 ENCOUNTER — Encounter: Payer: Self-pay | Admitting: Nurse Practitioner

## 2023-09-14 ENCOUNTER — Ambulatory Visit: Payer: Medicare HMO | Admitting: Interventional Cardiology

## 2023-09-14 ENCOUNTER — Ambulatory Visit: Payer: Medicare HMO | Admitting: Nurse Practitioner

## 2023-09-17 ENCOUNTER — Other Ambulatory Visit: Payer: Self-pay | Admitting: *Deleted

## 2023-09-17 ENCOUNTER — Encounter: Payer: Self-pay | Admitting: *Deleted

## 2023-09-17 DIAGNOSIS — I779 Disorder of arteries and arterioles, unspecified: Secondary | ICD-10-CM

## 2023-10-14 DIAGNOSIS — S29011A Strain of muscle and tendon of front wall of thorax, initial encounter: Secondary | ICD-10-CM | POA: Diagnosis not present

## 2023-10-14 DIAGNOSIS — G588 Other specified mononeuropathies: Secondary | ICD-10-CM | POA: Diagnosis not present

## 2023-10-16 NOTE — Progress Notes (Unsigned)
Cardiology Office Note:   Date:  10/21/2023  ID:  [name] [name], [name] [name] [birth year], MRN [id number] PCP:  Sigmund Hazel, MD  [facility] Providers Cardiologist:  Alverda Skeans, MD Referring MD: Sigmund Hazel, MD  Chief Complaint/Reason for Referral: DOD visit for chest pain ASSESSMENT:    1. Chest pain of uncertain etiology   2. S/P CABG x 4   3. Hyperlipidemia LDL goal <55   4. Essential hypertension   5. Aortic atherosclerosis (HCC)   6. Autoimmune hepatitis (HCC)   7. Chronic obstructive pulmonary disease, unspecified COPD type (HCC)      PLAN:   In order of problems listed above: Chest pain: Resolved; monitor for now. Status post CABG: Aspirin, Zetia, and blood pressure control. Hyperlipidemia: Not on statin due to autoimmune hepatitis; continue Zetia, check lipid panel, and LP(a) today.  Goal LDL is less than 55 given history of acute coronary syndrome.  If not at goal will refer to pharmacy for further recommendations.  If needed will refer to Carmela Hurt in pharmacy.   Hypertension: Blood pressure is well-controlled today. Aortic atherosclerosis: Continue aspirin, Zetia, and blood pressure control. Autoimmune hepatitis: Followed at [facility]; avoiding statins for now. COPD: Has chronic shortness of breath that is unchanged.  I spent 35 minutes reviewing all clinical data during and prior to this visit including all relevant imaging studies, laboratories, clinical information from other health systems and prior notes from both Cardiology and other specialties, interviewing the patient, conducting a complete physical examination, and coordinating care in order to formulate a comprehensive and personalized evaluation and treatment plan.            Dispo:  Return in about 6 months (around 04/19/2024).      Medication Adjustments/Labs and Tests Ordered: Current medicines are reviewed at length with the patient today.  Concerns regarding medicines are outlined above.  The  following changes have been made:  no change   Labs/tests ordered: Orders Placed This Encounter  Procedures   Lipid panel   Lipoprotein A (LPA)    Medication Changes: No orders of the defined types were placed in this encounter.   Current medicines are reviewed at length with the patient today.  The patient does not have concerns regarding medicines.  History of Present Illness:      FOCUSED PROBLEM LIST:   Acute coronary syndrome 2022 CABG x 4 with vein graft to LAD, vein graft to RCA, sequential vein graft to obtuse marginal 1 and obtuse marginal 2 EF 55 to 60% without valve issues TTE 2024 Hyperlipidemia Not on statins due to autoimmune hepatitis Hypertension Hyperkalemia in response to losartan Ankle swelling in response to high dose amlodipine GERD Aortic atherosclerosis on chest x-ray 2022 Autoimmune hepatitis with stage II fibrosis; followed at [facility] Status post azathioprine and budesonide therapy discontinued March 2020 COPD  10/24: The patient is a 78 year old female with the above listed medical problems who is being seen in expedited fashion due to nausea and chest discomfort.  The patient contacted our office yesterday with these complaints that have been going on for the last few days.  She is unsure whether they are related to her stomach or her heart and for this reason she is being seen today in an expedited fashion.  Of note last month the patient had a potassium level that was quite high; losartan was stopped and amlodipine was started.  We arranged for her to have a troponin checked this morning and it was not elevated (troponin  T 7ng/L with normal being 0-14ng/L).  The patient tells me that over the weekend she was at the mall and she bent over when she stood up she developed a sharp pain across her chest.  She took a Tums which promptly relieved the discomfort.  She had not been taking the PPI that had been prescribed to her on a routine basis.  Since that  episode she has been on the PPI and has had no recurrence of chest discomfort.  She walks on a routine basis in the park for exercise.  She denies any exertional angina.  She does have some chronic shortness of breath which presents itself after walking for about 2 miles.  Plan: Obtain echocardiogram; increase amlodipine to 10 mg; refer for ambulatory blood pressure monitor.  1/25: In the interim the patient's echocardiogram was reassuring.  She is referred for an ambulatory blood pressure monitor but this was not completed.  She was seen in urgent care due to some neuropathic pain under her ribs.  She was diagnosed with intercostal neuralgia.  She is on gabapentin for this.  She was very surprised to see that her blood pressure was so well-controlled at the urgent care at around 112/70.  Her issues today are mostly musculoskeletal.  She had a left shoulder strain which she is getting over.  She would like to take some ibuprofen in addition to aspirin and her pantoprazole which I said was fine.  She otherwise has no cardiovascular complaints.  She fortunately has not needed hospitalization for any reason.  She is tolerating her medications well without any issues.        Current Medications: Current Meds  Medication Sig   acetaminophen (TYLENOL) 325 MG tablet Take 2 tablets (650 mg total) by mouth every 6 (six) hours as needed for mild pain.   amLODipine (NORVASC) 5 MG tablet Take 5 mg by mouth every evening.   aspirin EC 81 MG tablet Take 1 tablet (81 mg total) by mouth daily. Swallow whole.   ezetimibe (ZETIA) 10 MG tablet Take 1 tablet (10 mg total) by mouth daily.   gabapentin (NEURONTIN) 100 MG capsule Take 100 mg by mouth at bedtime.   metoprolol succinate (TOPROL-XL) 50 MG 24 hr tablet Take 50 mg by mouth every evening. Take with or immediately following a meal.   pantoprazole (PROTONIX) 20 MG tablet Take 20 mg by mouth daily.     Review of Systems:   Please see the history of present  illness.    All other systems reviewed and are negative.     EKGs/Labs/Other Test Reviewed:   EKG: EKG performed October 2024 demonstrates normal sinus rhythm  EKG Interpretation Date/Time:    Ventricular Rate:    PR Interval:    QRS Duration:    QT Interval:    QTC Calculation:   R Axis:      Text Interpretation:           Risk Assessment/Calculations:          Physical Exam:   VS:  BP 122/80   Pulse 86   Ht 5\' 3"  (1.6 m)   Wt 148 lb 12.8 oz (67.5 kg)   LMP  (LMP Unknown)   SpO2 97%   BMI 26.36 kg/m       Wt Readings from Last 3 Encounters:  10/21/23 148 lb 12.8 oz (67.5 kg)  07/06/23 146 lb (66.2 kg)  03/24/23 145 lb 9.6 oz (66 kg)  GENERAL:  No apparent distress, AOx3 HEENT:  No carotid bruits, +2 carotid impulses, no scleral icterus CAR: RRR no murmurs, gallops, rubs, or thrills RES:  Clear to auscultation bilaterally ABD:  Soft, nontender, nondistended, positive bowel sounds x 4 VASC:  +2 radial pulses, +2 carotid pulses NEURO:  CN 2-12 grossly intact; motor and sensory grossly intact PSYCH:  No active depression or anxiety EXT:  No edema, ecchymosis, or cyanosis  Signed, Orbie Pyo, MD  10/21/2023 2:55 PM    [facility] [address] [place], [place], [place]  [address] Phone: [phone]; Fax: [phone]   Note:  This document was prepared using Dragon voice recognition software and may include unintentional dictation errors.

## 2023-10-21 ENCOUNTER — Ambulatory Visit: Payer: Medicare HMO | Attending: Internal Medicine | Admitting: Internal Medicine

## 2023-10-21 ENCOUNTER — Encounter: Payer: Self-pay | Admitting: Internal Medicine

## 2023-10-21 VITALS — BP 122/80 | HR 86 | Ht 63.0 in | Wt 148.8 lb

## 2023-10-21 DIAGNOSIS — I700 Atherosclerosis of aorta: Secondary | ICD-10-CM | POA: Diagnosis not present

## 2023-10-21 DIAGNOSIS — R079 Chest pain, unspecified: Secondary | ICD-10-CM

## 2023-10-21 DIAGNOSIS — Z951 Presence of aortocoronary bypass graft: Secondary | ICD-10-CM

## 2023-10-21 DIAGNOSIS — E785 Hyperlipidemia, unspecified: Secondary | ICD-10-CM

## 2023-10-21 DIAGNOSIS — I10 Essential (primary) hypertension: Secondary | ICD-10-CM

## 2023-10-21 DIAGNOSIS — J449 Chronic obstructive pulmonary disease, unspecified: Secondary | ICD-10-CM

## 2023-10-21 DIAGNOSIS — K754 Autoimmune hepatitis: Secondary | ICD-10-CM | POA: Diagnosis not present

## 2023-10-21 NOTE — Patient Instructions (Addendum)
Medication Instructions:  Your physician recommends that you continue on your current medications as directed. Please refer to the Current Medication list given to you today.  *Take your amlodipine and metoprolol in the evening.*  *If you need a refill on your cardiac medications before your next appointment, please call your pharmacy*  Lab Work: TODAY: Lipid panel, LP(a) If you have labs (blood work) drawn today and your tests are completely normal, you will receive your results only by: MyChart Message (if you have MyChart) OR A paper copy in the mail If you have any lab test that is abnormal or we need to change your treatment, we will call you to review the results.  Testing/Procedures: None ordered today.  Follow-Up: At [facility], you and your health needs are our priority.  As part of our continuing mission to provide you with exceptional heart care, we have created designated Provider Care Teams.  These Care Teams include your primary Cardiologist (physician) and Advanced Practice Providers (APPs -  Physician Assistants and Nurse Practitioners) who all work together to provide you with the care you need, when you need it.  Your next appointment:   6 month(s)  The format for your next appointment:   In Person  Provider:   Orbie Pyo, MD

## 2023-10-22 ENCOUNTER — Encounter: Payer: Self-pay | Admitting: Internal Medicine

## 2023-10-22 DIAGNOSIS — G588 Other specified mononeuropathies: Secondary | ICD-10-CM | POA: Diagnosis not present

## 2023-10-22 LAB — LIPID PANEL
Chol/HDL Ratio: 3.3 {ratio} (ref 0.0–4.4)
Cholesterol, Total: 171 mg/dL (ref 100–199)
HDL: 52 mg/dL (ref >39)
LDL Chol Calc (NIH): 107 mg/dL — ABNORMAL HIGH (ref 0–99)
Triglycerides: 62 mg/dL (ref 0–149)
VLDL Cholesterol Cal: 12 mg/dL (ref 5–40)

## 2023-10-22 LAB — LIPOPROTEIN A (LPA): Lipoprotein (a): 8.8 nmol/L (ref <75.0)

## 2023-10-27 ENCOUNTER — Other Ambulatory Visit: Payer: Self-pay

## 2023-10-27 DIAGNOSIS — Z79899 Other long term (current) drug therapy: Secondary | ICD-10-CM

## 2023-10-27 DIAGNOSIS — E785 Hyperlipidemia, unspecified: Secondary | ICD-10-CM

## 2023-11-07 IMAGING — CR DG CHEST 2V
2 series · 2 of 2 positions shown · non-contrast
Comparison: Chest x-ray dated September 10, 2021

CLINICAL DATA: Pleural effusion

EXAM:
CHEST - 2 VIEW

[chest lat]
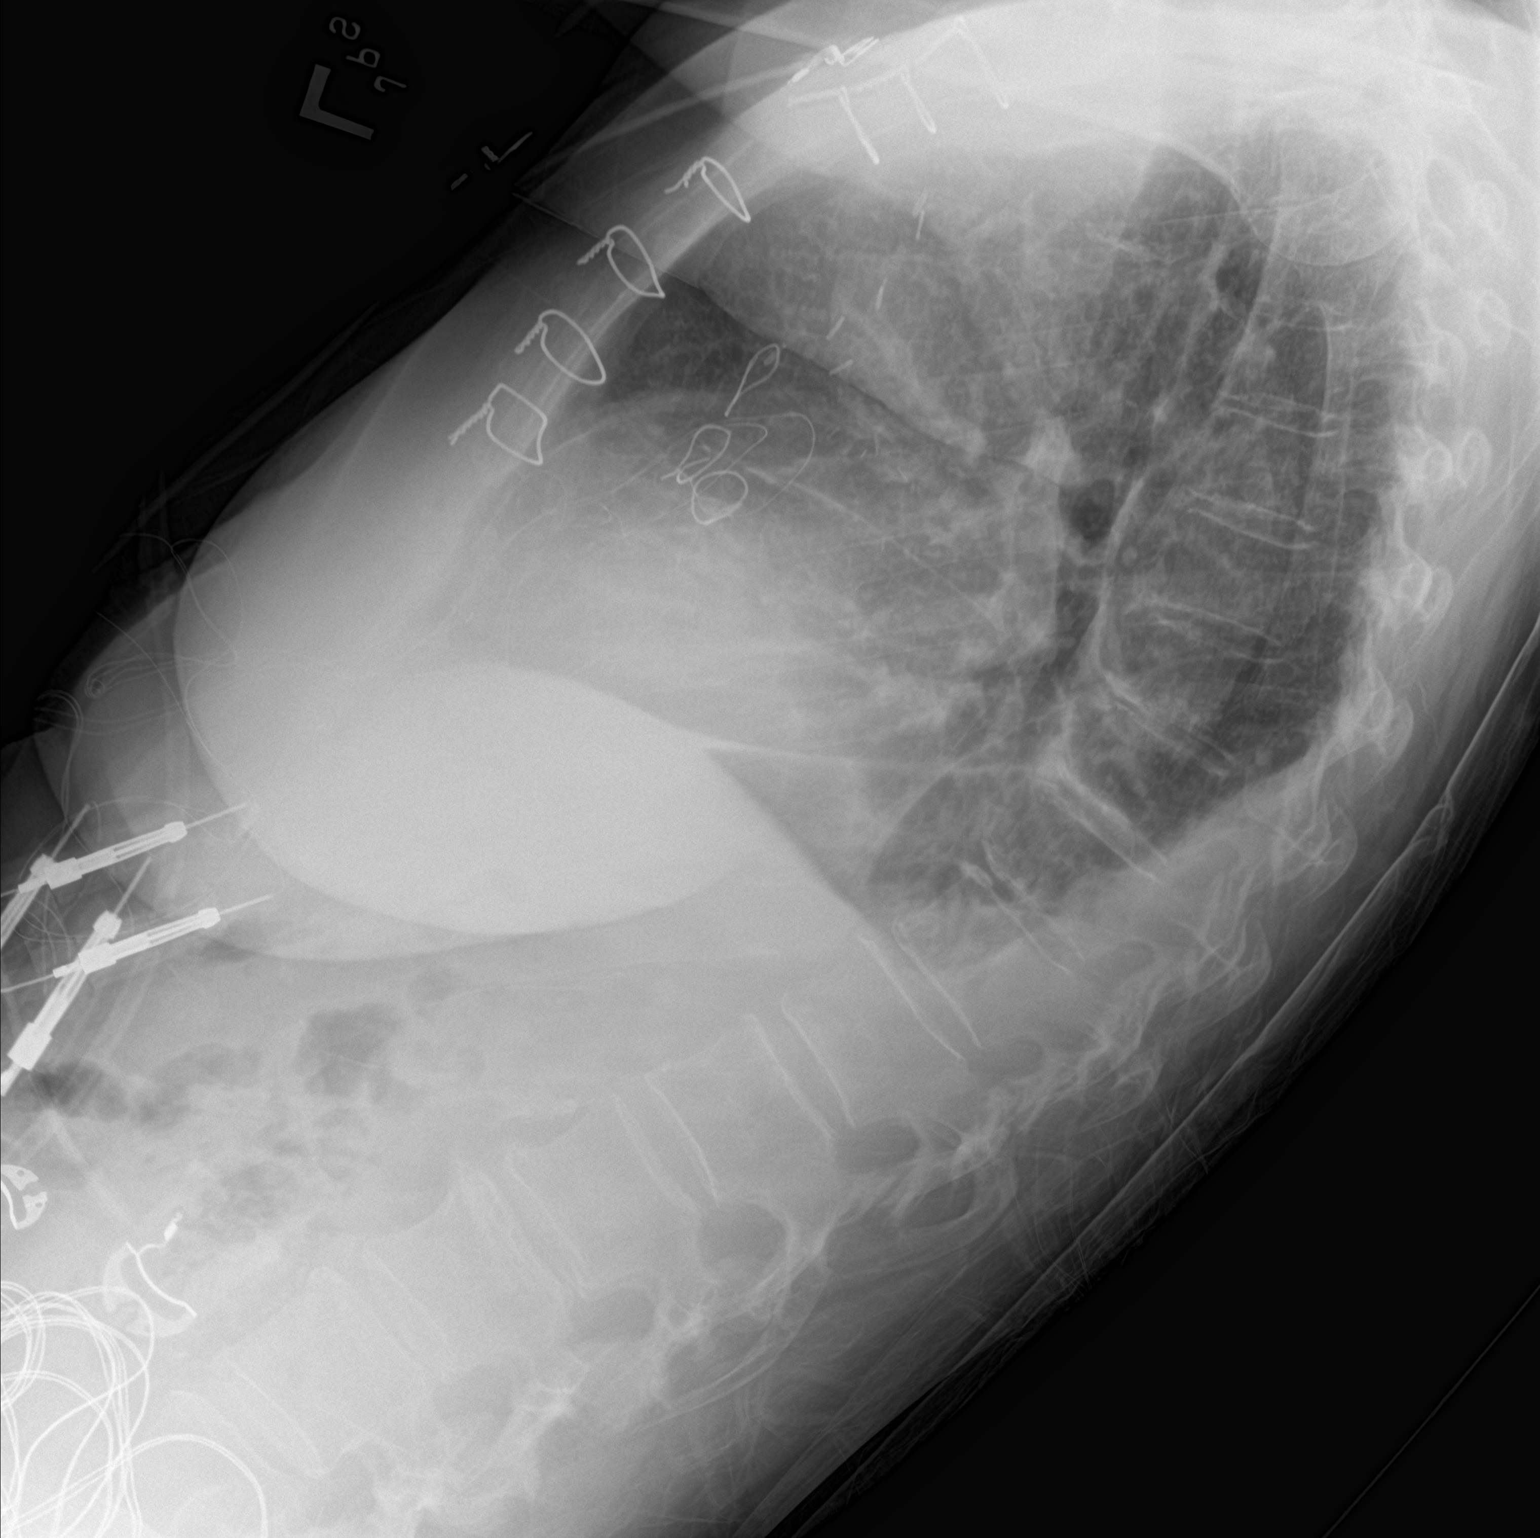

[chest ap]
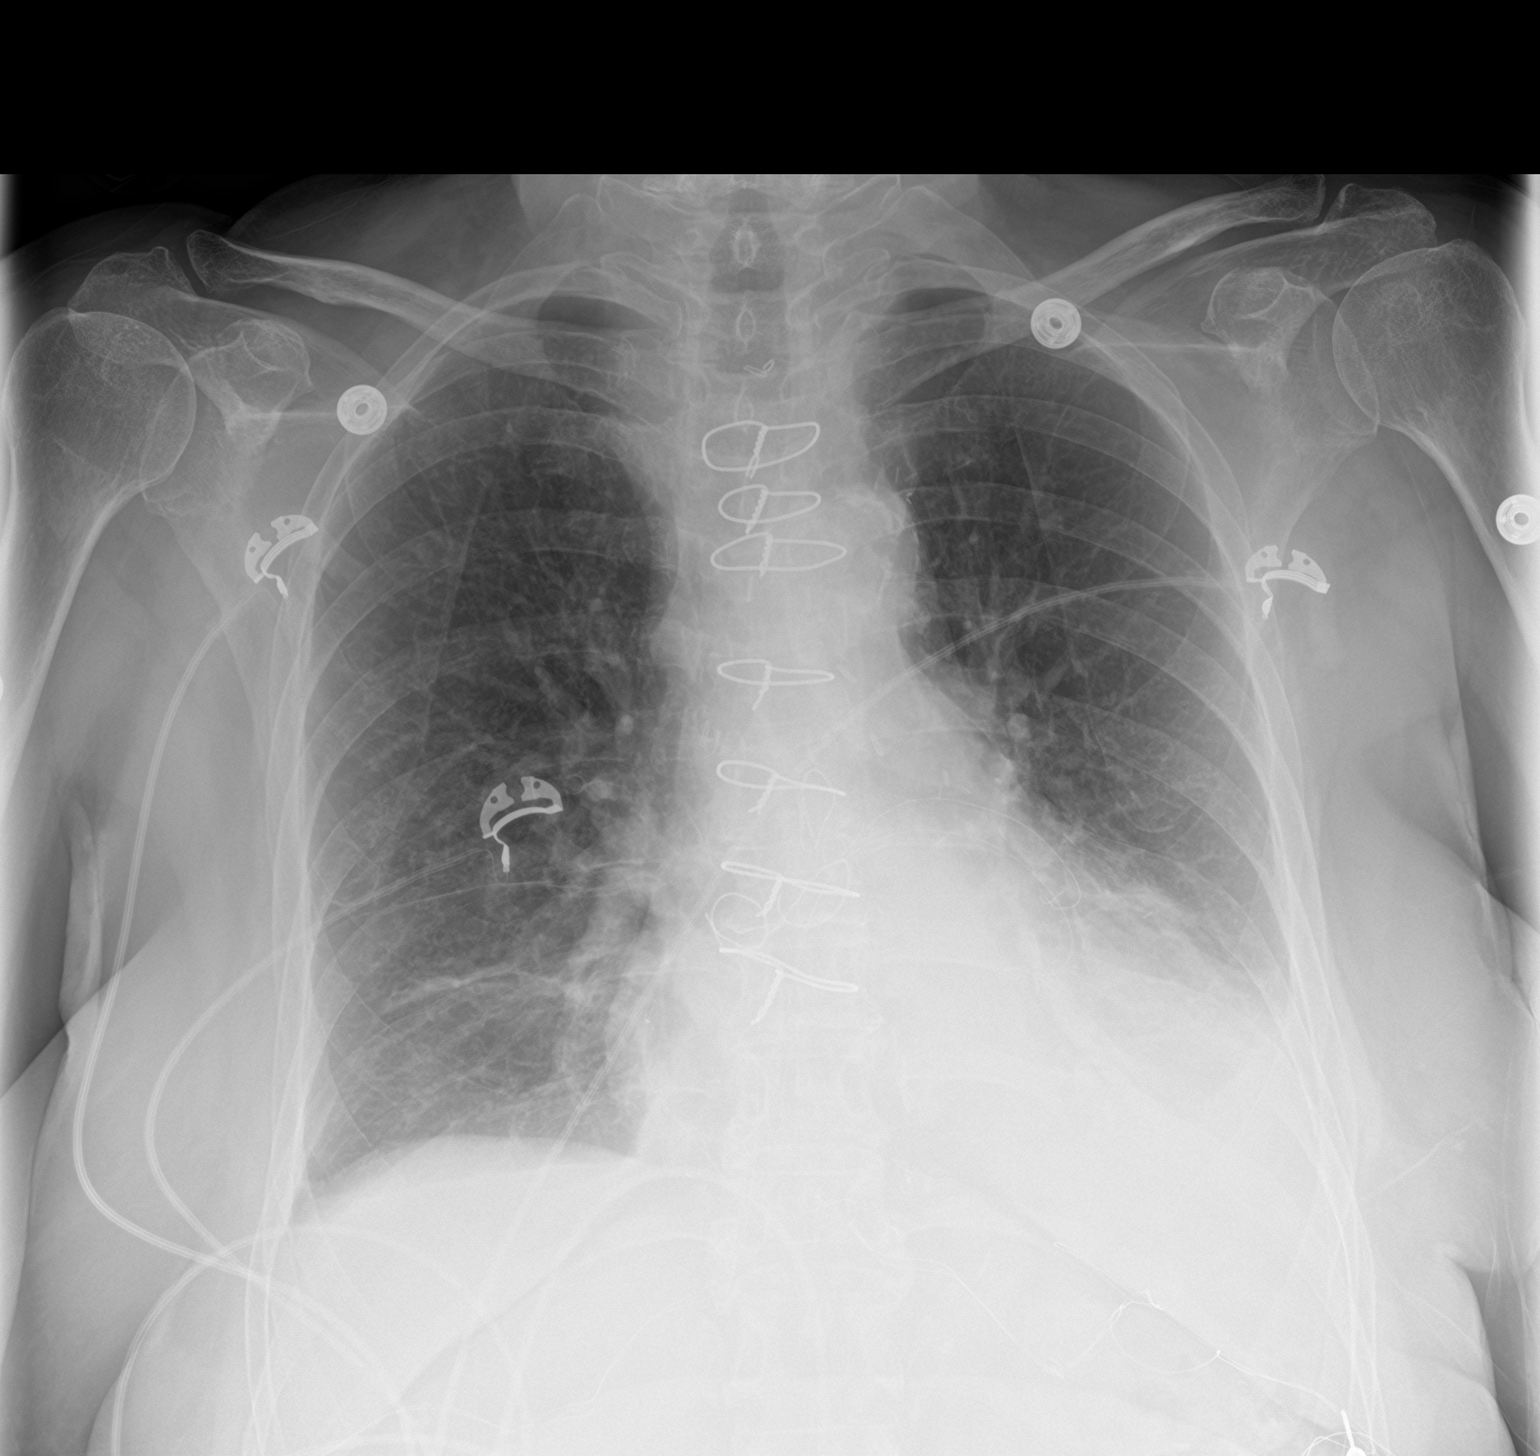

[2 of 2 positions shown; findings below may reference images not displayed]

FINDINGS: Cardiac and mediastinal contours are unchanged status post median
sternotomy. Epicardial pacing wires noted. Interval removal of right
IJ line. Bibasilar atelectasis. Trace right and small left pleural
effusions are slightly increased in size when compared to prior
exam. No evidence of pneumothorax.
IMPRESSION: Trace right and small left pleural effusions are slightly increased
in size when compared to prior exam.

## 2023-11-17 DIAGNOSIS — R112 Nausea with vomiting, unspecified: Secondary | ICD-10-CM | POA: Diagnosis not present

## 2023-11-17 DIAGNOSIS — K529 Noninfective gastroenteritis and colitis, unspecified: Secondary | ICD-10-CM | POA: Diagnosis not present

## 2023-12-11 IMAGING — DX DG CHEST 2V
2 series · 2 of 2 positions shown · non-contrast
Comparison: 09/19/2021

CLINICAL DATA: Post 4 vessel CABG on 09/08/2021, follow-up, former
smoker

EXAM:
CHEST - 2 VIEW

[dg chest 2 view (1 of 2)]
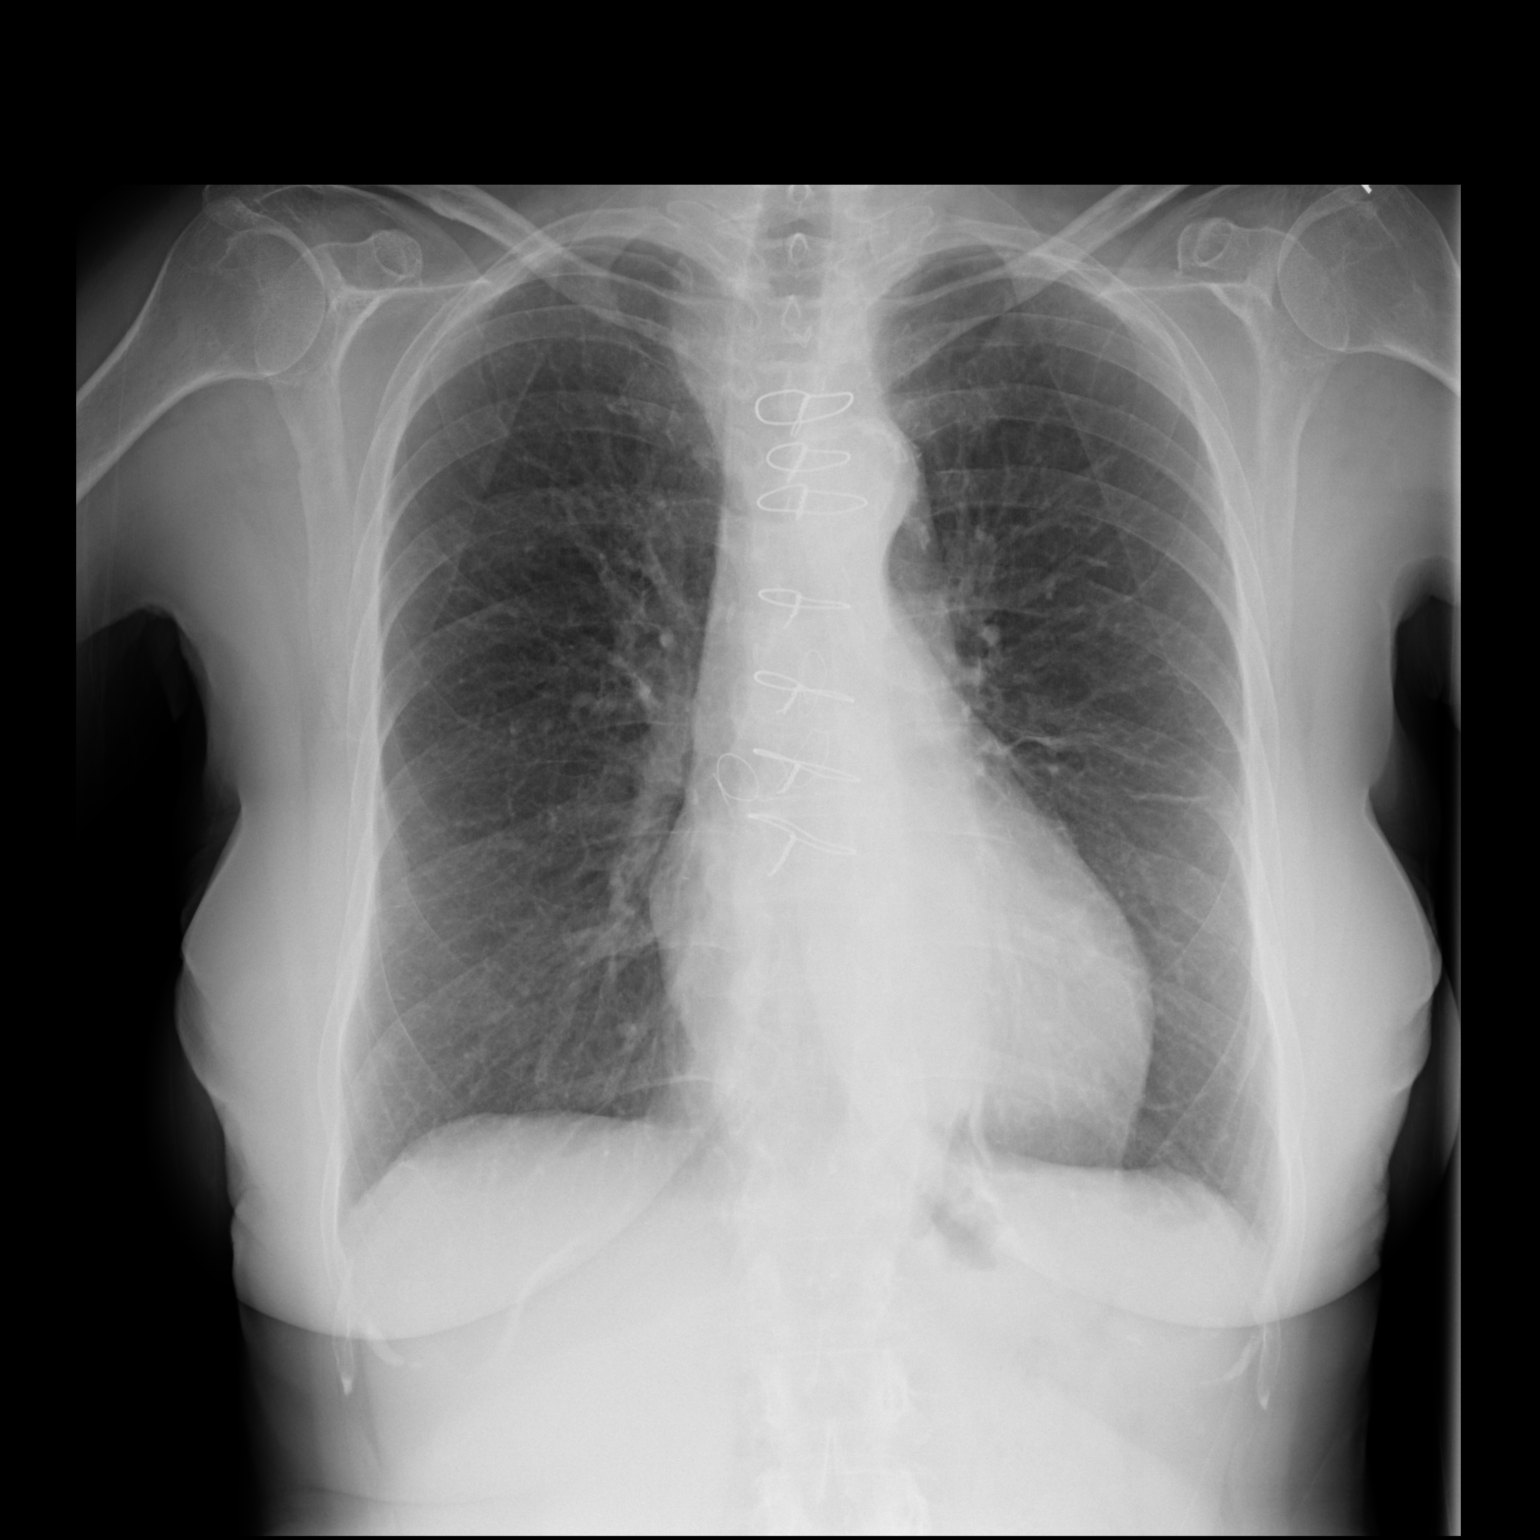

[dg chest 2 view (2 of 2)]
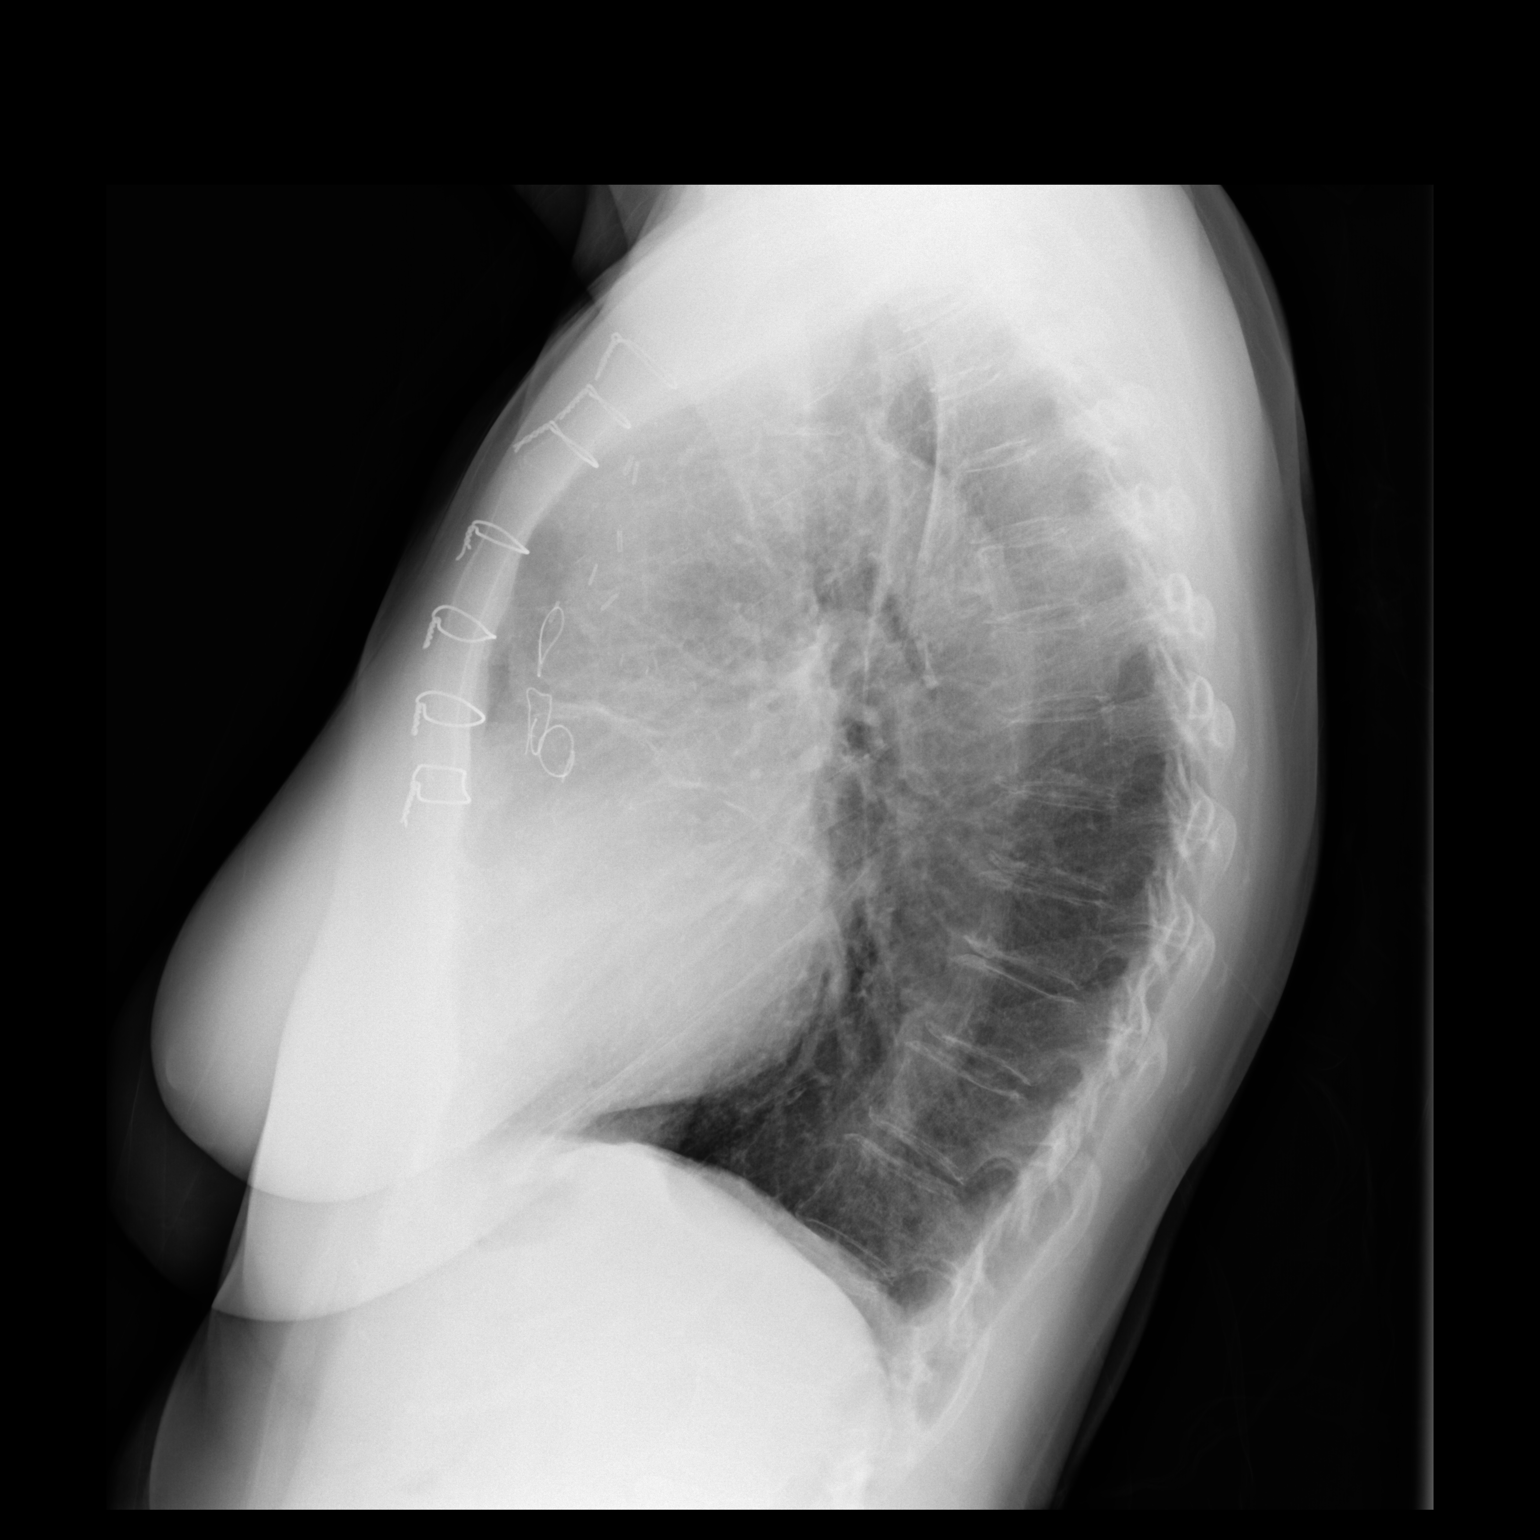

[2 of 2 positions shown; findings below may reference images not displayed]

FINDINGS: Normal heart size post CABG.

Mediastinal contours and pulmonary vascularity normal.

Atherosclerotic calcification aorta.

Lungs hyperinflated with minimal residual atelectasis in lingula and
LEFT lower lobe.

Remaining lungs clear.

No pleural effusion or pneumothorax.

Bones demineralized.
IMPRESSION: Minimal residual atelectasis in lingula and LEFT lower lobe.

Improved aeration versus prior exam.

Aortic Atherosclerosis ([id number].T).

## 2023-12-30 DIAGNOSIS — G588 Other specified mononeuropathies: Secondary | ICD-10-CM | POA: Diagnosis not present

## 2024-02-01 DIAGNOSIS — K219 Gastro-esophageal reflux disease without esophagitis: Secondary | ICD-10-CM | POA: Diagnosis not present

## 2024-02-01 DIAGNOSIS — R202 Paresthesia of skin: Secondary | ICD-10-CM | POA: Diagnosis not present

## 2024-02-01 DIAGNOSIS — Z6826 Body mass index (BMI) 26.0-26.9, adult: Secondary | ICD-10-CM | POA: Diagnosis not present

## 2024-02-04 DIAGNOSIS — R202 Paresthesia of skin: Secondary | ICD-10-CM | POA: Diagnosis not present

## 2024-02-11 ENCOUNTER — Encounter (HOSPITAL_BASED_OUTPATIENT_CLINIC_OR_DEPARTMENT_OTHER): Payer: Self-pay

## 2024-02-11 ENCOUNTER — Other Ambulatory Visit: Payer: Self-pay

## 2024-02-11 ENCOUNTER — Emergency Department (HOSPITAL_BASED_OUTPATIENT_CLINIC_OR_DEPARTMENT_OTHER)
Admission: EM | Admit: 2024-02-11 | Discharge: 2024-02-11 | Disposition: A | Discharge status: Home / Self Care | Attending: Emergency Medicine | Admitting: Emergency Medicine

## 2024-02-11 DIAGNOSIS — I2510 Atherosclerotic heart disease of native coronary artery without angina pectoris: Secondary | ICD-10-CM | POA: Diagnosis not present

## 2024-02-11 DIAGNOSIS — Z7982 Long term (current) use of aspirin: Secondary | ICD-10-CM | POA: Diagnosis not present

## 2024-02-11 DIAGNOSIS — R42 Dizziness and giddiness: Secondary | ICD-10-CM | POA: Diagnosis not present

## 2024-02-11 DIAGNOSIS — I10 Essential (primary) hypertension: Secondary | ICD-10-CM | POA: Insufficient documentation

## 2024-02-11 DIAGNOSIS — Z79899 Other long term (current) drug therapy: Secondary | ICD-10-CM | POA: Insufficient documentation

## 2024-02-11 DIAGNOSIS — F419 Anxiety disorder, unspecified: Secondary | ICD-10-CM | POA: Insufficient documentation

## 2024-02-11 DIAGNOSIS — R208 Other disturbances of skin sensation: Secondary | ICD-10-CM

## 2024-02-11 DIAGNOSIS — Z951 Presence of aortocoronary bypass graft: Secondary | ICD-10-CM | POA: Diagnosis not present

## 2024-02-11 DIAGNOSIS — N644 Mastodynia: Secondary | ICD-10-CM | POA: Insufficient documentation

## 2024-02-11 LAB — CBC
HCT: 45.6 % (ref 36.0–46.0)
Hemoglobin: 15.7 g/dL — ABNORMAL HIGH (ref 12.0–15.0)
MCH: 29.9 pg (ref 26.0–34.0)
MCHC: 34.4 g/dL (ref 30.0–36.0)
MCV: 86.9 fL (ref 80.0–100.0)
Platelets: 240 10*3/uL (ref 150–400)
RBC: 5.25 MIL/uL — ABNORMAL HIGH (ref 3.87–5.11)
RDW: 13.3 % (ref 11.5–15.5)
WBC: 8.8 10*3/uL (ref 4.0–10.5)
nRBC: 0 % (ref 0.0–0.2)

## 2024-02-11 LAB — COMPREHENSIVE METABOLIC PANEL WITH GFR
ALT: 35 U/L (ref 0–44)
AST: 34 U/L (ref 15–41)
Albumin: 4.7 g/dL (ref 3.5–5.0)
Alkaline Phosphatase: 71 U/L (ref 38–126)
Anion gap: 13 (ref 5–15)
BUN: 12 mg/dL (ref 8–23)
CO2: 24 mmol/L (ref 22–32)
Calcium: 10.1 mg/dL (ref 8.9–10.3)
Chloride: 99 mmol/L (ref 98–111)
Creatinine, Ser: 0.89 mg/dL (ref 0.44–1.00)
GFR, Estimated: >60 mL/min (ref >60)
Glucose, Bld: 134 mg/dL — ABNORMAL HIGH (ref 70–99)
Potassium: 3.9 mmol/L (ref 3.5–5.1)
Sodium: 137 mmol/L (ref 135–145)
Total Bilirubin: 0.3 mg/dL (ref 0.0–1.2)
Total Protein: 8.3 g/dL — ABNORMAL HIGH (ref 6.5–8.1)

## 2024-02-11 LAB — LIPASE, BLOOD: Lipase: 35 U/L (ref 11–51)

## 2024-02-11 NOTE — ED Provider Notes (Signed)
 [place] EMERGENCY DEPARTMENT AT [facility] Provider Note   CSN: [id number] Arrival date & time: 02/11/24  4098     History  Chief Complaint  Patient presents with   Abdominal Pain    [name] [name] is a 78 y.o. female.  HPI   78 year old female with past medical history of CAD, NSTEMI status post CABG, anxiety presents emergency department with concern for burning sensation under her bilateral breasts as well as multiple isolated days in the past month where she feels "deathly sick".  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  But she is here more urgently and concern for these isolated days in the past month when she feels extremely ill.  Currently today she feels baseline.  Has some mild burning underneath her breast but does not feel acutely ill.  In regards to the days where she does feel ill, she has a difficult time explaining what she feels.  She mainly states that her whole body feels awful at those times.  Denies any specific headache, chest pain, difficulty breathing, abdominal pain, vomiting or diarrhea.  She has not had associated fever, dysuria, rash.  Mention in the past that she has had low sodium, iron  and high potassium.  Home Medications Prior to Admission medications   Medication Sig Start Date End Date Taking? Authorizing Provider  acetaminophen  (TYLENOL ) 325 MG tablet Take 2 tablets (650 mg total) by mouth every 6 (six) hours as needed for mild pain. 09/12/21   Allegra Arch, PA-C  amLODipine  (NORVASC ) 5 MG tablet Take 5 mg by mouth every evening. 04/21/23   [provider]  aspirin  EC 81 MG tablet Take 1 tablet (81 mg total) by mouth daily. Swallow whole. 11/25/21   Sonny Dust, MD  ezetimibe  (ZETIA ) 10 MG tablet Take 1 tablet (10 mg total) by mouth daily. 09/13/21   Barrett, Erin R, PA-C  gabapentin (NEURONTIN) 100 MG capsule Take  100 mg by mouth at bedtime. 10/14/23   [provider]  metoprolol  succinate (TOPROL -XL) 50 MG 24 hr tablet Take 50 mg by mouth every evening. Take with or immediately following a meal.    [provider]  pantoprazole  (PROTONIX ) 20 MG tablet Take 20 mg by mouth daily. 03/07/23   [provider]      Allergies    Budesonide    Review of Systems   Review of Systems  Constitutional:  Negative for chills and fever.  Respiratory:  Negative for chest tightness and shortness of breath.   Cardiovascular:  Negative for chest pain and leg swelling.  Gastrointestinal:  Negative for abdominal pain, diarrhea and vomiting.  Musculoskeletal:  Negative for neck pain and neck stiffness.       Burning sensation under bilateral breasts, worse on left  Skin:  Negative for color change, rash and wound.  Neurological:  Negative for facial asymmetry, speech difficulty, weakness, numbness and headaches.    Physical Exam Updated Vital Signs BP (!) 188/96   Pulse 73   Temp 98.5 F (36.9 C) (Oral)   Resp 15   LMP  (LMP Unknown)   SpO2 97%  Physical Exam Vitals and nursing note reviewed.  Constitutional:      General: She is not in acute distress.    Appearance: Normal appearance.     Comments: Anxious, at times hyperventilating, intermittently tearful  HENT:     Head: Normocephalic.  Mouth/Throat:     Mouth: Mucous membranes are moist.  Cardiovascular:     Rate and Rhythm: Normal rate and regular rhythm.  Pulmonary:     Effort: Pulmonary effort is normal. No respiratory distress.  Abdominal:     General: Bowel sounds are normal. There is no distension.     Palpations: Abdomen is soft.     Tenderness: There is no abdominal tenderness. There is no guarding or rebound. Negative signs include Murphy's sign.  Skin:    General: Skin is warm.     Findings: No rash.  Neurological:     General: No focal deficit present.     Mental Status: She is alert and oriented to  person, place, and time. Mental status is at baseline.  Psychiatric:        Mood and Affect: Mood is anxious.     ED Results / Procedures / Treatments   Labs (all labs ordered are listed, but only abnormal results are displayed) Labs Reviewed  COMPREHENSIVE METABOLIC PANEL WITH GFR - Abnormal; Notable for the following components:      Result Value   Glucose, Bld 134 (*)    Total Protein 8.3 (*)    All other components within normal limits  CBC - Abnormal; Notable for the following components:   RBC 5.25 (*)    Hemoglobin 15.7 (*)    All other components within normal limits  LIPASE, BLOOD    EKG None  Radiology No results found.  Procedures Procedures    Medications Ordered in ED Medications - No data to display  ED Course/ Medical Decision Making/ A&P                                 Medical Decision Making Amount and/or Complexity of Data Reviewed Labs: ordered.   78 year old female presents emergency department with burning sensation under bilateral breasts for the past 5 months as well as multiple episodes of feeling deathly sick in the last 2 weeks.  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  Currently at this time she has neither of the above complaints.  Vitals are normal and stable on arrival.  Mild hypertension but she did not take her morning medications.  Patient appears anxious, at times tearful and frustrated.  Otherwise physical exam is reassuring.  There is no rash under the breast, no finding of shingles, abdomen is benign.  EKG is unchanged from previous, doubt ACS.  Blood work is all normal without any acute findings.  Unclear what the exact etiology is but at this time there are no findings of acute emergent medical condition that would warrant further workup/admission.  Discussed with patient following up with her primary doctor for further  evaluation.  Patient at this time appears safe and stable for discharge and close outpatient follow up. Discharge plan and strict return to ED precautions discussed, patient verbalizes understanding and agreement.        Final Clinical Impression(s) / ED Diagnoses Final diagnoses:  None    Rx / DC Orders ED Discharge Orders     None         Flonnie Humphrey, DO 02/11/24 1046

## 2024-02-11 NOTE — ED Triage Notes (Signed)
 Pt reports that she has had burning pain under her breasts since January that was diagnosed as intercostal neuralgia and prescribed gabapentin but she has continued to have burning sensation. Pt states that she has been "deathly sick" numerous times over last 2 weeks but is having difficulty describing symptoms.

## 2024-02-11 NOTE — Discharge Instructions (Signed)
 You have been seen and discharged from the emergency department.  Your blood work was normal.  The EKG of your heart was normal for you.  Follow-up with your primary provider for further evaluation and further care.  If you want to stop your gabapentin please notify your primary doctor, sometimes this medication needs a taper to discontinue.  You may also establish care with a GI doctor for further evaluation.  Take home medications as prescribed. If you have any worsening symptoms or further concerns for your health please return to an emergency department for further evaluation.

## 2024-02-29 DIAGNOSIS — S22050D Wedge compression fracture of T5-T6 vertebra, subsequent encounter for fracture with routine healing: Secondary | ICD-10-CM | POA: Diagnosis not present

## 2024-02-29 DIAGNOSIS — M810 Age-related osteoporosis without current pathological fracture: Secondary | ICD-10-CM | POA: Diagnosis not present

## 2024-02-29 DIAGNOSIS — R202 Paresthesia of skin: Secondary | ICD-10-CM | POA: Diagnosis not present

## 2024-02-29 DIAGNOSIS — Z6825 Body mass index (BMI) 25.0-25.9, adult: Secondary | ICD-10-CM | POA: Diagnosis not present

## 2024-03-16 ENCOUNTER — Ambulatory Visit (HOSPITAL_COMMUNITY)
Admission: RE | Admit: 2024-03-16 | Discharge: 2024-03-16 | Disposition: A | Discharge status: Home / Self Care | Payer: Medicare HMO | Source: Ambulatory Visit | Attending: Cardiology | Admitting: Cardiology

## 2024-03-16 DIAGNOSIS — I25118 Atherosclerotic heart disease of native coronary artery with other forms of angina pectoris: Secondary | ICD-10-CM

## 2024-03-16 DIAGNOSIS — Z951 Presence of aortocoronary bypass graft: Secondary | ICD-10-CM | POA: Diagnosis not present

## 2024-03-16 DIAGNOSIS — I77819 Aortic ectasia, unspecified site: Secondary | ICD-10-CM

## 2024-03-16 LAB — ECHOCARDIOGRAM COMPLETE
Area-P 1/2: 4.06 cm2
S' Lateral: 2.9 cm

## 2024-03-19 ENCOUNTER — Ambulatory Visit: Payer: Self-pay | Admitting: Internal Medicine

## 2024-03-22 DIAGNOSIS — Z0000 Encounter for general adult medical examination without abnormal findings: Secondary | ICD-10-CM | POA: Diagnosis not present

## 2024-03-22 DIAGNOSIS — Z1331 Encounter for screening for depression: Secondary | ICD-10-CM | POA: Diagnosis not present

## 2024-04-12 ENCOUNTER — Other Ambulatory Visit (HOSPITAL_BASED_OUTPATIENT_CLINIC_OR_DEPARTMENT_OTHER): Payer: Self-pay | Admitting: Family Medicine

## 2024-04-12 DIAGNOSIS — S22050D Wedge compression fracture of T5-T6 vertebra, subsequent encounter for fracture with routine healing: Secondary | ICD-10-CM

## 2024-04-19 NOTE — Progress Notes (Signed)
 Cardiology Office Note:   Date:  04/25/2024  ID:  [name] [name], [name] [name] [birth year], MRN [id number] PCP:  Cleotilde Planas, MD  [facility] Providers Cardiologist:  Wendel Haws, MD Referring MD: Cleotilde Planas, MD  Chief Complaint/Reason for Referral: Follow-up for CAD ASSESSMENT:    1. Acute coronary syndrome (HCC)   2. Hyperlipidemia LDL goal <55   3. Aortic atherosclerosis (HCC)   4. Essential hypertension   5. Autoimmune hepatitis (HCC)       PLAN:   In order of problems listed above: History of acute coronary syndrome/Status post CABG: continue aspirin  81 mg, Toprol  50 mg, and Zetia  10 mg. Hyperlipidemia: On Zetia  10 mg.  Check LDLL today.  Patient is intolerant of statins and has a history of autoimmune hepatitis.  No stains and patient refuses Repatha.  I counseled her that her LDL will likely be above goal with a goal being less than 55 given her history of acute coronary syndrome.  She understands this completely. Hypertension: BP above goal.  I suspect anxiety and whitecoat hypertension is contributing.  Previously I did discuss an ambulatory blood pressure monitor but the patient would need to pay $300 for this to be done.  This was therefore deferred.  I did discuss with her perhaps treatment of her anxiety.  She will be talking to her PCP about this.  Continue amlodipine  5 mg daily.  Refer to PharmD for BP recommendations. Aortic atherosclerosis: Continue aspirin  81 mg, Zetia  10 mg. Autoimmune hepatitis: Followed at [facility]; avoiding statins for now. COPD: Followed by other providers.  Tolerating Toprol  50 mg daily.  I spent 38 minutes reviewing all clinical data during and prior to this visit including all relevant imaging studies, laboratories, clinical information from other health systems and prior notes from both Cardiology and other specialties, interviewing the patient, conducting a complete physical examination, and coordinating care in order to formulate a comprehensive  and personalized evaluation and treatment plan.            Dispo:  Return in about 6 months (around 10/26/2024).      Medication Adjustments/Labs and Tests Ordered: Current medicines are reviewed at length with the patient today.  Concerns regarding medicines are outlined above.  The following changes have been made:  no change   Labs/tests ordered: Orders Placed This Encounter  Procedures   Lipid Profile    Medication Changes: No orders of the defined types were placed in this encounter.   Current medicines are reviewed at length with the patient today.  The patient does not have concerns regarding medicines.  History of Present Illness:      FOCUSED PROBLEM LIST:   Acute coronary syndrome 2022 CABG x 4 with VG to LAD, VG to RCA, sequential VG to OM1-OM2 EF 55 to 60% without valve issues TTE 2024 Hyperlipidemia Not on statins due to autoimmune hepatitis Patient defers Repatha LP(a) 8.8 Hypertension Hyperkalemia in response to losartan  Ankle swelling in response to high dose amlodipine  Patient declines HCTZ GERD Aortic atherosclerosis on chest x-ray 2022 Autoimmune hepatitis with stage II fibrosis; followed at [facility] Status post azathioprine  and budesonide therapy discontinued March 2020 COPD  10/24: The patient is a 78 year old female with the above listed medical problems who is being seen in expedited fashion due to nausea and chest discomfort.  The patient contacted our office yesterday with these complaints that have been going on for the last few days.  She is unsure whether they are related to her stomach or  her heart and for this reason she is being seen today in an expedited fashion.  Of note last month the patient had a potassium level that was quite high; losartan  was stopped and amlodipine  was started.  We arranged for her to have a troponin checked this morning and it was not elevated (troponin T 7ng/L with normal being 0-14ng/L).  The patient tells me that  over the weekend she was at the mall and she bent over when she stood up she developed a sharp pain across her chest.  She took a Tums which promptly relieved the discomfort.  She had not been taking the PPI that had been prescribed to her on a routine basis.  Since that episode she has been on the PPI and has had no recurrence of chest discomfort.  She walks on a routine basis in the park for exercise.  She denies any exertional angina.  She does have some chronic shortness of breath which presents itself after walking for about 2 miles.  Plan: Obtain echocardiogram; increase amlodipine  to 10 mg; refer for ambulatory blood pressure monitor.  1/25: In the interim the patient's echocardiogram was reassuring.  She is referred for an ambulatory blood pressure monitor but this was not completed.  She was seen in urgent care due to some neuropathic pain under her ribs.  She was diagnosed with intercostal neuralgia.  She is on gabapentin for this.  She was very surprised to see that her blood pressure was so well-controlled at the urgent care at around 112/70.  Her issues today are mostly musculoskeletal.  She had a left shoulder strain which she is getting over.  She would like to take some ibuprofen in addition to aspirin  and her pantoprazole  which I said was fine.  She otherwise has no cardiovascular complaints.  She fortunately has not needed hospitalization for any reason.  She is tolerating her medications well without any issues.  Plan: Check lipid panel and LP(a).  July 2025:  Patient consents to use of AI scribe. In the interim the patient's LDL was above goal.  She is referred to pharmacy for further recommendations however did not see them.  She experiences elevated blood pressure readings, particularly in medical settings, which she attributes to anxiety. Her blood pressure was 170/110 mmHg at urgent care on Friday and 160/100 mmHg yesterday. She feels nervous and upset, contributing to her elevated  readings. At home, after walking five miles and hydrating, her blood pressure can be as low as 135/84 mmHg.  She has been experiencing anxiety and agitation over the past six months, which she feels has exacerbated her blood pressure issues. Seeing people in poor health conditions, such as those in wheelchairs, significantly upsets her and raises her blood pressure. Certain environments, like the dentist's office, are particularly stressful, causing her blood pressure to spike.  She has experienced a burning sensation under her left breast for seven months, which was initially thought to be intercostal neuralgia, though the diagnosis remains uncertain. Initially treated with gabapentin, which caused adverse effects, leading to multiple emergency room visits and a change in treatment. She has since sought care from a chiropractor, which she finds beneficial. The burning sensation sometimes feels like she is 'on fire for thirty hours in a row.'         Current Medications: Current Meds  Medication Sig   acetaminophen  (TYLENOL ) 325 MG tablet Take 2 tablets (650 mg total) by mouth every 6 (six) hours as needed for mild pain.  amLODipine  (NORVASC ) 5 MG tablet Take 5 mg by mouth every evening.   aspirin  EC 81 MG tablet Take 1 tablet (81 mg total) by mouth daily. Swallow whole.   ezetimibe  (ZETIA ) 10 MG tablet Take 1 tablet (10 mg total) by mouth daily.   gabapentin (NEURONTIN) 100 MG capsule Take 100 mg by mouth at bedtime.   metoprolol  succinate (TOPROL -XL) 50 MG 24 hr tablet Take 50 mg by mouth every evening. Take with or immediately following a meal.   pantoprazole  (PROTONIX ) 20 MG tablet Take 20 mg by mouth daily.     Review of Systems:   Please see the history of present illness.    All other systems reviewed and are negative.     EKGs/Labs/Other Test Reviewed:   EKG: May 2025 sinus rhythm, left atrial enlargement, left axis deviation  EKG Interpretation Date/Time:    Ventricular Rate:     PR Interval:    QRS Duration:    QT Interval:    QTC Calculation:   R Axis:      Text Interpretation:           Risk Assessment/Calculations:          Physical Exam:   VS:  BP (!) 190/110   Pulse 78   Ht 5' 2 (1.575 m)   Wt 134 lb (60.8 kg)   LMP  (LMP Unknown)   SpO2 98%   BMI 24.51 kg/m       Wt Readings from Last 3 Encounters:  04/25/24 134 lb (60.8 kg)  10/21/23 148 lb 12.8 oz (67.5 kg)  07/06/23 146 lb (66.2 kg)      GENERAL:  No apparent distress, AOx3 HEENT:  No carotid bruits, +2 carotid impulses, no scleral icterus CAR: RRR no murmurs, gallops, rubs, or thrills RES:  Clear to auscultation bilaterally ABD:  Soft, nontender, nondistended, positive bowel sounds x 4 VASC:  +2 radial pulses, +2 carotid pulses NEURO:  CN 2-12 grossly intact; motor and sensory grossly intact PSYCH:  No active depression or anxiety EXT:  No edema, ecchymosis, or cyanosis  Signed, Dion Sibal K Damon Baisch, MD  04/25/2024 11:24 AM    [facility] [address] [place], [place], [place]  [address] Phone: [phone]; Fax: [phone]   Note:  This document was prepared using Dragon voice recognition software and may include unintentional dictation errors.

## 2024-04-21 DIAGNOSIS — R202 Paresthesia of skin: Secondary | ICD-10-CM | POA: Diagnosis not present

## 2024-04-21 DIAGNOSIS — R030 Elevated blood-pressure reading, without diagnosis of hypertension: Secondary | ICD-10-CM | POA: Diagnosis not present

## 2024-04-21 DIAGNOSIS — S22050A Wedge compression fracture of T5-T6 vertebra, initial encounter for closed fracture: Secondary | ICD-10-CM | POA: Diagnosis not present

## 2024-04-25 ENCOUNTER — Encounter: Payer: Self-pay | Admitting: Internal Medicine

## 2024-04-25 ENCOUNTER — Ambulatory Visit: Attending: Internal Medicine | Admitting: Internal Medicine

## 2024-04-25 VITALS — BP 190/110 | HR 78 | Ht 62.0 in | Wt 134.0 lb

## 2024-04-25 DIAGNOSIS — E785 Hyperlipidemia, unspecified: Secondary | ICD-10-CM | POA: Diagnosis not present

## 2024-04-25 DIAGNOSIS — I10 Essential (primary) hypertension: Secondary | ICD-10-CM | POA: Diagnosis not present

## 2024-04-25 DIAGNOSIS — I249 Acute ischemic heart disease, unspecified: Secondary | ICD-10-CM

## 2024-04-25 DIAGNOSIS — K754 Autoimmune hepatitis: Secondary | ICD-10-CM | POA: Diagnosis not present

## 2024-04-25 DIAGNOSIS — I700 Atherosclerosis of aorta: Secondary | ICD-10-CM | POA: Diagnosis not present

## 2024-04-25 NOTE — Patient Instructions (Signed)
 Please schedule a follow up with Robbi Blanch, PharmD for blood pressure  Medication Instructions:  No changes *If you need a refill on your cardiac medications before your next appointment, please call your pharmacy*  Lab Work: Today: lipid panel - first floor [facility]  If you have labs (blood work) drawn today and your tests are completely normal, you will receive your results only by: MyChart Message (if you have MyChart) OR A paper copy in the mail If you have any lab test that is abnormal or we need to change your treatment, we will call you to review the results.  Testing/Procedures: none  Follow-Up: At [facility], you and your health needs are our priority.  As part of our continuing mission to provide you with exceptional heart care, our providers are all part of one team.  This team includes your primary Cardiologist (physician) and Advanced Practice Providers or APPs (Physician Assistants and Nurse Practitioners) who all work together to provide you with the care you need, when you need it.  Your next appointment:   6 month(s)  Provider:   Glendia Ferrier, PA-C

## 2024-04-26 ENCOUNTER — Encounter: Payer: Self-pay | Admitting: Neurology

## 2024-04-26 ENCOUNTER — Ambulatory Visit: Payer: Self-pay | Admitting: Internal Medicine

## 2024-04-26 ENCOUNTER — Ambulatory Visit: Payer: Self-pay | Admitting: Neurology

## 2024-04-26 VITALS — BP 176/96 | HR 73 | Ht 62.0 in | Wt 136.0 lb

## 2024-04-26 DIAGNOSIS — M546 Pain in thoracic spine: Secondary | ICD-10-CM

## 2024-04-26 DIAGNOSIS — S22050A Wedge compression fracture of T5-T6 vertebra, initial encounter for closed fracture: Secondary | ICD-10-CM

## 2024-04-26 DIAGNOSIS — R208 Other disturbances of skin sensation: Secondary | ICD-10-CM

## 2024-04-26 LAB — LIPID PANEL
Chol/HDL Ratio: 3.2 ratio (ref 0.0–4.4)
Cholesterol, Total: 177 mg/dL (ref 100–199)
HDL: 55 mg/dL (ref >39)
LDL Chol Calc (NIH): 110 mg/dL — ABNORMAL HIGH (ref 0–99)
Triglycerides: 62 mg/dL (ref 0–149)
VLDL Cholesterol Cal: 12 mg/dL (ref 5–40)

## 2024-04-26 MED ORDER — DULOXETINE HCL 30 MG PO CPEP: 30.0000 mg | ORAL_CAPSULE | Freq: Every day | ORAL | 11 refills | Status: DC

## 2024-04-26 NOTE — Progress Notes (Signed)
 [facility]  PATIENT: [name] [name] Pressey DOB: 1946-05-24  REFERRING DOCTOR OR PCP: Olam Pinal; Hubert Crawford PA SOURCE: Patient, notes from primary care, imaging and lab reports, x-rays personally reviewed.  _________________________________   HISTORICAL  CHIEF COMPLAINT:  Chief Complaint  Patient presents with   New Patient (Initial Visit)    Pt in room 10.alone. Paper referral for Paresthesias under breast for months - burnnig sensation. Pt reports a heat/warmer sensation under breast, warmer in right than left. Pt took gabapentin but stopped didn't helped and made her sick..Symptoms started in Jan, pt currently seeing chiropractor    HISTORY OF PRESENT ILLNESS:  I had the pleasure of seeing your patient, [name] [name], at [facility] for neurologic consultation regarding her truncal dysesthesias.  She is a 78 year old woman with hypertension, hyperlipidemia, CAD and anxiety who has had a heat sensation under her breasts left > right that started in December 2024.  She had the onset of pain in the upper back between the shoulder blades that changed over the next month to be in the flanks and then she just had abnormal sensation under the breasts, left > right.   She uses ice packs with benefit.   She feels the temperature is actually elevated under the breast.      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   She could not tolerate the gabapentin.   She has not tried other medications.  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    She did not have any residual pain after the CABG.       She has a chronic T6 compression fracture on 04/21/2024 thoracic spine xray   This does not seem to be there in 2023 when she had a CXR with 2 views.  Chest x-ray from 02/08/2023  seems to show some compression but less then on the scan from 04/21/2024  REVIEW OF SYSTEMS: Constitutional: No fevers, chills, sweats, or change in appetite Eyes: No visual changes, double vision, eye pain Ear, nose and throat: No hearing loss, ear pain, nasal congestion, sore throat Cardiovascular: No chest pain, palpitations she had CABG x 4 in 2022. Respiratory:  No shortness of breath at rest or with exertion.   No wheezes GastrointestinaI: No nausea, vomiting, diarrhea, abdominal pain, fecal incontinence Genitourinary:  No dysuria, urinary retention or frequency.  No nocturia. Musculoskeletal:  No neck pain, back pain Integumentary: No rash, pruritus, skin lesions Neurological: as above Psychiatric: She has anxiety. Endocrine: No palpitations, diaphoresis, change in appetite, change in weigh or increased thirst Hematologic/Lymphatic:  No anemia, purpura, petechiae. Allergic/Immunologic: No itchy/runny eyes, nasal congestion, recent allergic reactions, rashes  ALLERGIES: Allergies  Allergen Reactions   Budesonide Rash    HOME MEDICATIONS:  Current Outpatient Medications:    amLODipine  (NORVASC ) 5 MG tablet, Take 5 mg by mouth every evening., Disp: , Rfl:    aspirin  EC 81 MG tablet, Take 1 tablet (81 mg total) by mouth daily. Swallow whole., Disp: 90 tablet, Rfl: 3   ezetimibe  (ZETIA ) 10 MG tablet, Take 1 tablet (10 mg total) by mouth daily., Disp: 30 tablet, Rfl: 3   metoprolol  succinate (TOPROL -XL) 50 MG 24 hr tablet, Take 50 mg by mouth every evening. Take with or immediately following a  meal., Disp: , Rfl:    pantoprazole  (PROTONIX ) 20 MG tablet, Take 20 mg by mouth daily., Disp: , Rfl:    acetaminophen  (TYLENOL ) 325 MG tablet, Take 2 tablets (650 mg total) by mouth every 6 (six) hours as needed for mild pain. (Patient not taking: Reported on 04/26/2024), Disp: , Rfl:    gabapentin (NEURONTIN) 100 MG capsule, Take 100 mg by mouth at bedtime. (Patient not taking: Reported on  04/26/2024), Disp: , Rfl:   PAST MEDICAL HISTORY: Past Medical History:  Diagnosis Date   Anxiety    Hepatitis     PAST SURGICAL HISTORY: Past Surgical History:  Procedure Laterality Date   CORONARY ARTERY BYPASS GRAFT N/A 09/08/2021   Procedure: CORONARY ARTERY BYPASS GRAFTING (CABG) X 4  USING LEFT INTERNAL MAMMARY ARTERY AND RIGHT ENDOSCOPIC GREATER SAPHENOUS VEIN CONDUITS.;  Surgeon: Lucas Dorise POUR, MD;  Location: [facility] OR;  Service: Open Heart Surgery;  Laterality: N/A;   ENDOVEIN HARVEST OF GREATER SAPHENOUS VEIN Right 09/08/2021   Procedure: ENDOVEIN HARVEST OF GREATER SAPHENOUS VEIN;  Surgeon: Lucas Dorise POUR, MD;  Location: [facility] OR;  Service: Open Heart Surgery;  Laterality: Right;   LEFT HEART CATH AND CORONARY ANGIOGRAPHY N/A 09/01/2021   Procedure: LEFT HEART CATH AND CORONARY ANGIOGRAPHY;  Surgeon: Mady Bruckner, MD;  Location: [facility] INVASIVE CV LAB;  Service: Cardiovascular;  Laterality: N/A;   TEE WITHOUT CARDIOVERSION N/A 09/08/2021   Procedure: TRANSESOPHAGEAL ECHOCARDIOGRAM (TEE);  Surgeon: Lucas Dorise POUR, MD;  Location: [facility] OR;  Service: Open Heart Surgery;  Laterality: N/A;    FAMILY HISTORY: Family History  Problem Relation Age of Onset   Premature CHD Father     SOCIAL HISTORY: Social History   Socioeconomic History   Marital status: Single    Spouse name: Not on file   Number of children: Not on file   Years of education: Not on file   Highest education level: Not on file  Occupational History   Not on file  Tobacco Use   Smoking status: Former    Current packs/day: 1.50    Average packs/day: 1.5 packs/day for 30.0 years (45.0 ttl pk-yrs)    Types: Cigarettes   Smokeless tobacco: Never  Vaping Use   Vaping status: Never Used  Substance and Sexual Activity   Alcohol use: Not Currently   Drug use: Never   Sexual activity: Not Currently  Other Topics Concern   Not on file  Social History Narrative   Not on file   Social Drivers of Health    Financial Resource Strain: Low Risk  (11/10/2018)   Received from [facility]   Overall Financial Resource Strain (CARDIA)    Difficulty of Paying Living Expenses: Not hard at all  Food Insecurity: No Food Insecurity (11/10/2018)   Received from [facility]   Hunger Vital Sign    Within the past 12 months, you worried that your food would run out before you got the money to buy more.: Never true    Within the past 12 months, the food you bought just didn't last and you didn't have money to get more.: Never true  Transportation Needs: No Transportation Needs (11/10/2018)   Received from [facility] - Transportation    In the past 12 months, has lack of transportation kept you from medical appointments or from getting medications?: No    Lack of Transportation (Non-Medical): No  Physical Activity: Insufficiently Active (12/20/2018)   Received from  [facility]   Exercise Vital Sign    On average, how many days per week do you engage in moderate to strenuous exercise (like a brisk walk)?: 3 days    On average, how many minutes do you engage in exercise at this level?: 20 min  Stress: Stress Concern Present (02/21/2019)   Received from [facility] - Occupational Stress Questionnaire    Feeling of Stress : Rather much  Social Connections: Unknown (11/10/2018)   Received from [facility]   Social Connection and Isolation Panel    Frequency of Communication with Friends and Family: Not on file    Frequency of Social Gatherings with Friends and Family: Not on file    How often do you attend church or religious services?: Never    Do you belong to any clubs or organizations such as church groups, unions, fraternal or athletic groups, or school groups?: No    How often do you attend meetings of the clubs or organizations you belong to?:  Never    Are you married, widowed, divorced, separated, never married, or living with a partner?: Never married  Intimate Partner Violence: Not on file       PHYSICAL EXAM  Vitals:   04/26/24 1500  BP: (!) 176/96  Pulse: 73  SpO2: 98%  Weight: 136 lb (61.7 kg)  Height: 5' 2 (1.575 m)    Body mass index is 24.87 kg/m.   General: The patient is well-developed and well-nourished and in no acute distress  HEENT:  Head is [place]/AT.  Sclera are anicteric.    Neck: No carotid bruits are noted.  The neck is nontender.  Cardiovascular: The heart has a regular rate and rhythm with a normal S1 and S2. There were no murmurs, gallops or rubs.    Skin: Extremities are without rash or  edema.  Musculoskeletal:  Back is nontender  Neurologic Exam  Mental status: The patient is alert and oriented x 3 at the time of the examination. The patient has apparent normal recent and remote memory, with an apparently normal attention span and concentration ability.   Speech is normal.  Cranial nerves: Extraocular movements are full.  Facial symmetry is present. There is good facial sensation to soft touch bilaterally.Facial strength is normal.  Trapezius and sternocleidomastoid strength is normal. No dysarthria is noted.  The tongue is midline, and the patient has symmetric elevation of the soft palate. No obvious hearing deficits are noted.  Motor:  Muscle bulk is normal.   Tone is normal. Strength is  5 / 5 in all 4 extremities.   Sensory: Sensory testing is intact to pinprick, soft touch and vibration sensation in all 4 extremities.  Normal sensation in the trunk  Coordination: Cerebellar testing reveals good finger-nose-finger and heel-to-shin bilaterally.  Gait and station: Station is normal.   Gait is normal. Tandem gait is mildly wide but normal for age. Romberg is negative.   Reflexes: Deep tendon reflexes are symmetric and normal bilaterally.   Plantar responses are  flexor.    DIAGNOSTIC DATA (LABS, IMAGING, TESTING) - I reviewed patient records, labs, notes, testing and imaging myself where available.  Lab Results  Component Value Date   WBC 8.8 02/11/2024   HGB 15.7 (H) 02/11/2024   HCT 45.6 02/11/2024   MCV 86.9 02/11/2024   PLT 240 02/11/2024      Component Value Date/Time   NA 137 02/11/2024  0657   NA 139 07/06/2023 0753   K 3.9 02/11/2024 0657   CL 99 02/11/2024 0657   CO2 24 02/11/2024 0657   GLUCOSE 134 (H) 02/11/2024 0657   BUN 12 02/11/2024 0657   BUN 27 07/06/2023 0753   CREATININE 0.89 02/11/2024 0657   CALCIUM 10.1 02/11/2024 0657   PROT 8.3 (H) 02/11/2024 0657   PROT 7.4 07/06/2023 0753   ALBUMIN  4.7 02/11/2024 0657   ALBUMIN  4.4 07/06/2023 0753   AST 34 02/11/2024 0657   ALT 35 02/11/2024 0657   ALKPHOS 71 02/11/2024 0657   BILITOT 0.3 02/11/2024 0657   BILITOT 0.3 07/06/2023 0753   GFRNONAA >60 02/11/2024 0657   GFRNONAA 55 02/09/2023 1247   Lab Results  Component Value Date   CHOL 177 04/25/2024   HDL 55 04/25/2024   LDLCALC 110 (H) 04/25/2024   TRIG 62 04/25/2024   CHOLHDL 3.2 04/25/2024   Lab Results  Component Value Date   HGBA1C 5.5 08/31/2021   Lab Results  Component Value Date   VITAMINB12 720 09/20/2021   Lab Results  Component Value Date   TSH 2.413 09/19/2021       ASSESSMENT AND PLAN  Thoracic back pain, unspecified back pain laterality, unspecified chronicity  Dysesthesia  Closed wedge compression fracture of T6 vertebra, initial encounter (HCC)   In summary, Ms. Pearce is a 78 year old woman who had the onset of severe midthoracic back pain followed a couple weeks later by dysesthesias under the breasts on the left greater than right.  The x-ray from 04/21/2024 shows a T6 compression fracture.  This does not appear to be present on the chest x-ray from 10/15/2021 but there appears to be some T6 wedging on the 02/08/2023 chest x-ray.  Because she had such intense pain at the onset of  her current symptoms, it is possible that the compression fracture worsened in December 2024 leading to radiculopathy causing her current dysesthesias.   I have advised MRI of the thoracic spine to better evaluate the nerve roots.  CT would probably not be of much use as the nerve roots are not visualized.  However, she is very claustrophobic and prefers not to be on this.  Therefore, I will empirically treat the dysesthesias with duloxetine  30 mg.  This will hopefully help the burning pain and possibly give some benefit for the anxiety.  I would also consider lamotrigine as another medication that might help the dysesthesias and be better tolerated than gabapentin.  I did not schedule follow-up but she will call if she has significant new or worsening neurologic symptoms.  Thank you for asking me to see Ms. Rorie.  Please let me know if I can be of further assistance with her or other patients in the future.   Desire Fulp A. Vear, MD, [facility] 04/26/2024, 3:58 PM Certified in Neurology, Clinical Neurophysiology, Sleep Medicine and Neuroimaging  [facility] [address] [place], [place] [address] [phone]

## 2024-05-02 ENCOUNTER — Telehealth: Payer: Self-pay | Admitting: Neurology

## 2024-05-02 NOTE — Telephone Encounter (Signed)
 Patient complaining of arms and neck and back feel like on fire like a sunburn. Last 4 days have been in severe pain. Was taking DULoxetine  (CYMBALTA ) 30 MG capsule as prescribed for two days, had a reaction gave diarrhea, no appetite, discontinued after 2 days. Have not slept in 3 days because of the pain. Would like call back as soon as possible to discuss what to can be done.

## 2024-05-02 NOTE — Telephone Encounter (Signed)
 Dr. Vear- I reviewed last note. Did you want to try lamotrigine instead to see if she better tolerates? If so, what dose, directions, qty, refills do you want?

## 2024-05-03 NOTE — Telephone Encounter (Signed)
 Patient said spoke with on call doctor, Dr. Gregg. Told him I had Gabapentin  at home, prescribed by Dr. Cleotilde, but had stop taking it. Ask if can take Gabapentin  last night. Dr. Camara recommended to take 300 mg. Patient said doing a lot better this morning and slept all night, still have some diarrhea and feeling a little loopy. Will discuss with the nurse when she calls back.

## 2024-05-04 MED ORDER — GABAPENTIN 100 MG PO CAPS: ORAL_CAPSULE | ORAL | 2 refills | Status: DC

## 2024-05-04 NOTE — Telephone Encounter (Signed)
Dr. Felecia Shelling- what do you recommend?

## 2024-05-04 NOTE — Telephone Encounter (Addendum)
 Pt received gabapentin  100mg  qty 180 on 02/23/24. I spoke w/ Dr. Vear. He ok'd calling in updated prescription to match how he is directing pt to take since dose increased from what PCP called in.

## 2024-05-04 NOTE — Telephone Encounter (Signed)
 Called pt at  [phone].  She just finished walking at park, walks about 5 miles every morning.  Heat sensation mainly under L breast. Relayed Dr. Duncan recommendation. She is agreeable to plan. Updated rx sent to: [id number] - [place], [place] - [address] .  She will try this for a couple weeks, if ineffective, she will call back. If she has any trouble tolerating, she will let us  know before then.

## 2024-05-04 NOTE — Telephone Encounter (Signed)
 Called pt at [phone]. Connection bad. She asked I call her on cell. Called and spoke w/ pt at [phone] (M).   She reports she took gabapentin  100mg  capsule (3 caps total) two nights ago. Was able to sleep all night.However, burning pain/heat under breasts returned the next day around 1pm. Dose made her loopy and she is afraid of falling/driving. She took 100mg  (2 capsules) last night and tolerated better. However, did not sleep well, still having burning/heat sensation under breasts. She had diarrhea after taking duloxetine  x2 days. Only had 2 episodes, did not last all day. She also had diarrhea yesterday after taking gabapentin .   Feels her anxiety worsens sx. Still concerned about doing MRI. I discussed open MRI option with pt. She will think for about this option and let us  know.   Aware I will confirm plan with MD and call her back. Total time of call was 

## 2024-05-15 DIAGNOSIS — L539 Erythematous condition, unspecified: Secondary | ICD-10-CM | POA: Diagnosis not present

## 2024-05-18 DIAGNOSIS — L0390 Cellulitis, unspecified: Secondary | ICD-10-CM | POA: Diagnosis not present

## 2024-05-18 DIAGNOSIS — R110 Nausea: Secondary | ICD-10-CM | POA: Diagnosis not present

## 2024-05-23 DIAGNOSIS — L03312 Cellulitis of back [any part except buttock]: Secondary | ICD-10-CM | POA: Diagnosis not present

## 2024-05-28 DIAGNOSIS — L989 Disorder of the skin and subcutaneous tissue, unspecified: Secondary | ICD-10-CM | POA: Diagnosis not present

## 2024-05-28 DIAGNOSIS — R21 Rash and other nonspecific skin eruption: Secondary | ICD-10-CM | POA: Diagnosis not present

## 2024-06-01 DIAGNOSIS — R208 Other disturbances of skin sensation: Secondary | ICD-10-CM | POA: Diagnosis not present

## 2024-06-01 DIAGNOSIS — L308 Other specified dermatitis: Secondary | ICD-10-CM | POA: Diagnosis not present

## 2024-06-01 DIAGNOSIS — L309 Dermatitis, unspecified: Secondary | ICD-10-CM | POA: Diagnosis not present

## 2024-06-05 ENCOUNTER — Ambulatory Visit: Admitting: Pharmacist

## 2024-06-07 DIAGNOSIS — L931 Subacute cutaneous lupus erythematosus: Secondary | ICD-10-CM | POA: Diagnosis not present

## 2024-06-07 DIAGNOSIS — Z79899 Other long term (current) drug therapy: Secondary | ICD-10-CM | POA: Diagnosis not present

## 2024-06-12 DIAGNOSIS — L308 Other specified dermatitis: Secondary | ICD-10-CM | POA: Diagnosis not present

## 2024-06-16 DIAGNOSIS — L989 Disorder of the skin and subcutaneous tissue, unspecified: Secondary | ICD-10-CM | POA: Diagnosis not present

## 2024-06-27 DIAGNOSIS — H31001 Unspecified chorioretinal scars, right eye: Secondary | ICD-10-CM | POA: Diagnosis not present

## 2024-06-28 DIAGNOSIS — R7689 Other specified abnormal immunological findings in serum: Secondary | ICD-10-CM | POA: Diagnosis not present

## 2024-06-28 DIAGNOSIS — F411 Generalized anxiety disorder: Secondary | ICD-10-CM | POA: Diagnosis not present

## 2024-06-28 DIAGNOSIS — R42 Dizziness and giddiness: Secondary | ICD-10-CM | POA: Diagnosis not present

## 2024-06-28 DIAGNOSIS — Z6824 Body mass index (BMI) 24.0-24.9, adult: Secondary | ICD-10-CM | POA: Diagnosis not present

## 2024-06-28 DIAGNOSIS — G629 Polyneuropathy, unspecified: Secondary | ICD-10-CM | POA: Diagnosis not present

## 2024-06-28 DIAGNOSIS — R208 Other disturbances of skin sensation: Secondary | ICD-10-CM | POA: Diagnosis not present

## 2024-07-04 ENCOUNTER — Emergency Department (HOSPITAL_BASED_OUTPATIENT_CLINIC_OR_DEPARTMENT_OTHER): Admitting: Radiology

## 2024-07-04 ENCOUNTER — Emergency Department (HOSPITAL_BASED_OUTPATIENT_CLINIC_OR_DEPARTMENT_OTHER)

## 2024-07-04 ENCOUNTER — Emergency Department (HOSPITAL_BASED_OUTPATIENT_CLINIC_OR_DEPARTMENT_OTHER)
Admission: EM | Admit: 2024-07-04 | Discharge: 2024-07-04 | Disposition: A | Discharge status: Home / Self Care | Source: Ambulatory Visit | Attending: Emergency Medicine | Admitting: Emergency Medicine

## 2024-07-04 ENCOUNTER — Encounter (HOSPITAL_BASED_OUTPATIENT_CLINIC_OR_DEPARTMENT_OTHER): Payer: Self-pay | Admitting: Emergency Medicine

## 2024-07-04 ENCOUNTER — Other Ambulatory Visit: Payer: Self-pay

## 2024-07-04 DIAGNOSIS — I729 Aneurysm of unspecified site: Secondary | ICD-10-CM | POA: Diagnosis not present

## 2024-07-04 DIAGNOSIS — R0789 Other chest pain: Secondary | ICD-10-CM

## 2024-07-04 DIAGNOSIS — R911 Solitary pulmonary nodule: Secondary | ICD-10-CM | POA: Diagnosis not present

## 2024-07-04 DIAGNOSIS — Z7982 Long term (current) use of aspirin: Secondary | ICD-10-CM | POA: Diagnosis not present

## 2024-07-04 DIAGNOSIS — E041 Nontoxic single thyroid nodule: Secondary | ICD-10-CM | POA: Diagnosis not present

## 2024-07-04 DIAGNOSIS — R519 Headache, unspecified: Secondary | ICD-10-CM | POA: Diagnosis not present

## 2024-07-04 DIAGNOSIS — R42 Dizziness and giddiness: Secondary | ICD-10-CM | POA: Insufficient documentation

## 2024-07-04 DIAGNOSIS — R9082 White matter disease, unspecified: Secondary | ICD-10-CM | POA: Diagnosis not present

## 2024-07-04 DIAGNOSIS — R531 Weakness: Secondary | ICD-10-CM | POA: Diagnosis not present

## 2024-07-04 DIAGNOSIS — I7121 Aneurysm of the ascending aorta, without rupture: Secondary | ICD-10-CM | POA: Diagnosis not present

## 2024-07-04 DIAGNOSIS — I2510 Atherosclerotic heart disease of native coronary artery without angina pectoris: Secondary | ICD-10-CM | POA: Diagnosis not present

## 2024-07-04 LAB — CBC WITH DIFFERENTIAL/PLATELET
Abs Immature Granulocytes: 0.02 K/uL (ref 0.00–0.07)
Basophils Absolute: 0.1 K/uL (ref 0.0–0.1)
Basophils Relative: 1 %
Eosinophils Absolute: 0.1 K/uL (ref 0.0–0.5)
Eosinophils Relative: 1 %
HCT: 45.3 % (ref 36.0–46.0)
Hemoglobin: 15.6 g/dL — ABNORMAL HIGH (ref 12.0–15.0)
Immature Granulocytes: 0 %
Lymphocytes Relative: 19 %
Lymphs Abs: 1.8 K/uL (ref 0.7–4.0)
MCH: 30.7 pg (ref 26.0–34.0)
MCHC: 34.4 g/dL (ref 30.0–36.0)
MCV: 89.2 fL (ref 80.0–100.0)
Monocytes Absolute: 0.5 K/uL (ref 0.1–1.0)
Monocytes Relative: 6 %
Neutro Abs: 6.6 K/uL (ref 1.7–7.7)
Neutrophils Relative %: 73 %
Platelets: 226 K/uL (ref 150–400)
RBC: 5.08 MIL/uL (ref 3.87–5.11)
RDW: 13.6 % (ref 11.5–15.5)
WBC: 9.1 K/uL (ref 4.0–10.5)
nRBC: 0 % (ref 0.0–0.2)

## 2024-07-04 LAB — COMPREHENSIVE METABOLIC PANEL WITH GFR
ALT: 44 U/L (ref 0–44)
AST: 41 U/L (ref 15–41)
Albumin: 4.8 g/dL (ref 3.5–5.0)
Alkaline Phosphatase: 63 U/L (ref 38–126)
Anion gap: 14 (ref 5–15)
BUN: 24 mg/dL — ABNORMAL HIGH (ref 8–23)
CO2: 23 mmol/L (ref 22–32)
Calcium: 10.3 mg/dL (ref 8.9–10.3)
Chloride: 100 mmol/L (ref 98–111)
Creatinine, Ser: 0.86 mg/dL (ref 0.44–1.00)
GFR, Estimated: >60 mL/min (ref >60)
Glucose, Bld: 124 mg/dL — ABNORMAL HIGH (ref 70–99)
Potassium: 4.3 mmol/L (ref 3.5–5.1)
Sodium: 137 mmol/L (ref 135–145)
Total Bilirubin: 0.4 mg/dL (ref 0.0–1.2)
Total Protein: 8.6 g/dL — ABNORMAL HIGH (ref 6.5–8.1)

## 2024-07-04 LAB — TROPONIN T, HIGH SENSITIVITY: Troponin T High Sensitivity: <15 ng/L (ref 0–19)

## 2024-07-04 LAB — CBG MONITORING, ED: Glucose-Capillary: 119 mg/dL — ABNORMAL HIGH (ref 70–99)

## 2024-07-04 LAB — LIPASE, BLOOD: Lipase: 45 U/L (ref 11–51)

## 2024-07-04 MED ORDER — ONDANSETRON HCL 4 MG/2ML IJ SOLN
4.0000 mg | Freq: Once | INTRAMUSCULAR | Status: DC
  Filled 2024-07-04: qty 2

## 2024-07-04 MED ORDER — IOHEXOL 350 MG/ML SOLN
100.0000 mL | Freq: Once | INTRAVENOUS | Status: AC | PRN
  Administered 2024-07-04: 75 mL via INTRAVENOUS

## 2024-07-04 NOTE — ED Provider Notes (Signed)
 [facility] Provider Note   CSN: [id number] Arrival date & time: 07/04/24  1036     Patient presents with: Dizziness   [name] [name] is a 78 y.o. female.   Patient is here with dizziness lightheadedness.  Ongoing chronic chest wall pain.  She has been having some dizziness nausea symptoms for the last 12 days.  She has had chronic chest wall pain for the last few months.  Follow-up with rheumatology for this.  She is not having any weakness numbness tingling.  No vision loss.  No speech changes.  Denies any fever or chills.  Denies any abdominal pain nausea vomiting diarrhea currently.  She has history of anxiety.  History of CAD.  The history is provided by the patient.       Prior to Admission medications   Medication Sig Start Date End Date Taking? Authorizing Provider  acetaminophen  (TYLENOL ) 325 MG tablet Take 2 tablets (650 mg total) by mouth every 6 (six) hours as needed for mild pain. Patient not taking: Reported on 04/26/2024 09/12/21   Zimmerman, Donielle M, PA-C  amLODipine  (NORVASC ) 5 MG tablet Take 5 mg by mouth every evening. 04/21/23   [provider]  aspirin  EC 81 MG tablet Take 1 tablet (81 mg total) by mouth daily. Swallow whole. 11/25/21   Hobart Powell BRAVO, MD  DULoxetine  (CYMBALTA ) 30 MG capsule Take 1 capsule (30 mg total) by mouth daily. 04/26/24   Sater, Charlie LABOR, MD  ezetimibe  (ZETIA ) 10 MG tablet Take 1 tablet (10 mg total) by mouth daily. 09/13/21   Barrett, Erin R, PA-C  gabapentin  (NEURONTIN ) 100 MG capsule Take 1 capsule (100 mg total) by mouth every morning AND 1 capsule (100 mg total) daily with supper AND 2 capsules (200 mg total) at bedtime. 05/04/24 05/04/25  Sater, Charlie LABOR, MD  metoprolol  succinate (TOPROL -XL) 50 MG 24 hr tablet Take 50 mg by mouth every evening. Take with or immediately following a meal.    [provider]  pantoprazole  (PROTONIX ) 20 MG tablet Take 20 mg by mouth  daily. 03/07/23   [provider]    Allergies: Budesonide    Review of Systems  Updated Vital Signs BP (!) 174/131   Pulse 86   Temp 98.6 F (37 C)   Resp 20   Wt 61.7 kg   LMP  (LMP Unknown)   SpO2 98%   BMI 24.87 kg/m   Physical Exam Vitals and nursing note reviewed.  Constitutional:      General: She is not in acute distress.    Appearance: She is well-developed. She is not ill-appearing.  HENT:     Head: Normocephalic and atraumatic.     Nose: Nose normal.     Mouth/Throat:     Mouth: Mucous membranes are moist.  Eyes:     Extraocular Movements: Extraocular movements intact.     Conjunctiva/sclera: Conjunctivae normal.     Pupils: Pupils are equal, round, and reactive to light.  Cardiovascular:     Rate and Rhythm: Normal rate and regular rhythm.     Pulses: Normal pulses.     Heart sounds: Normal heart sounds. No murmur heard. Pulmonary:     Effort: Pulmonary effort is normal. No respiratory distress.     Breath sounds: Normal breath sounds.  Abdominal:     General: Abdomen is flat.     Palpations: Abdomen is soft.     Tenderness: There is no abdominal tenderness.  Musculoskeletal:        General: No swelling.     Cervical back: Normal range of motion and neck supple.     Comments: Tenderness over anterior chest wall  Skin:    General: Skin is warm and dry.     Capillary Refill: Capillary refill takes less than 2 seconds.  Neurological:     General: No focal deficit present.     Mental Status: She is alert and oriented to person, place, and time.     Cranial Nerves: No cranial nerve deficit.     Sensory: No sensory deficit.     Motor: No weakness.     Coordination: Coordination normal.     Comments: 5+ out of 5 strength throughout, normal sensation, no drift, normal finger-nose-finger, normal speech  Psychiatric:        Mood and Affect: Mood normal.     (all labs ordered are listed, but only abnormal results are displayed) Labs Reviewed   CBC WITH DIFFERENTIAL/PLATELET - Abnormal; Notable for the following components:      Result Value   Hemoglobin 15.6 (*)    All other components within normal limits  COMPREHENSIVE METABOLIC PANEL WITH GFR - Abnormal; Notable for the following components:   Glucose, Bld 124 (*)    BUN 24 (*)    Total Protein 8.6 (*)    All other components within normal limits  CBG MONITORING, ED - Abnormal; Notable for the following components:   Glucose-Capillary 119 (*)    All other components within normal limits  LIPASE, BLOOD  TROPONIN T, HIGH SENSITIVITY    EKG: EKG Interpretation Date/Time:  Tuesday July 04 2024 10:42:57 EDT Ventricular Rate:  80 PR Interval:  150 QRS Duration:  66 QT Interval:  376 QTC Calculation: 433 R Axis:   33  Text Interpretation: Normal sinus rhythm Confirmed by Ruthe Cornet [phone]) on 07/04/2024 10:56:13 AM  Radiology: CT Angio Chest PE W and/or Wo Contrast Result Date: 07/04/2024 CLINICAL DATA:  Left chest pain with intermittent dizziness and nausea EXAM: CT ANGIOGRAPHY CHEST WITH CONTRAST TECHNIQUE: Multidetector CT imaging of the chest was performed using the standard protocol during bolus administration of intravenous contrast. Multiplanar CT image reconstructions and MIPs were obtained to evaluate the vascular anatomy. RADIATION DOSE REDUCTION: This exam was performed according to the departmental dose-optimization program which includes automated exposure control, adjustment of the mA and/or kV according to patient size and/or use of iterative reconstruction technique. CONTRAST:  75mL OMNIPAQUE  IOHEXOL  350 MG/ML SOLN COMPARISON:  Same day chest radiograph, thoracic spine dated 02/04/2024 FINDINGS: Cardiovascular: The study is high quality for the evaluation of pulmonary embolism. There are no filling defects in the central, lobar, segmental or subsegmental pulmonary artery branches to suggest acute pulmonary embolism. Ascending thoracic aortic aneurysm  measures 4.8 x 4.6 cm. Normal heart size. No significant pericardial fluid/thickening. Coronary artery calcifications and aortic atherosclerosis. Mediastinum/Nodes: Imaged thyroid  gland without nodules meeting criteria for imaging follow-up by size. Normal esophagus. No pathologically enlarged axillary, supraclavicular, mediastinal, or hilar lymph nodes. Lungs/Pleura: The central airways are patent. Left upper and lower lobe pulmonary nodules measuring up to 7 x 4 mm (8:84, 55, 85). No focal consolidation. No pneumothorax. No pleural effusion. Upper abdomen: Normal. Musculoskeletal: Unchanged chronic compression of T6 with new mild compression of T7 compared to 02/04/2024. Median sternotomy wires are nondisplaced. Review of the MIP images confirms the above findings. IMPRESSION: 1. No evidence of pulmonary embolism. 2. Ascending thoracic aortic aneurysm measures 4.8 x  4.6 cm. Ascending thoracic aortic aneurysm. Recommend semi-annual imaging followup by CTA or MRA and referral to cardiothoracic surgery if not already obtained. This recommendation follows 2010 ACCF/AHA/AATS/ACR/ASA/SCA/SCAI/SIR/STS/SVM Guidelines for the Diagnosis and Management of Patients With Thoracic Aortic Disease. Circulation. 2010; 121: Z733-z630. Aortic aneurysm NOS (ICD10-I71.9) 3. Left upper and lower lobe pulmonary nodules measuring up to 7 x 4 mm. Non-contrast chest CT at 3-6 months is recommended. If the nodules are stable at time of repeat CT, then future CT at 18-24 months (from today's scan) is considered optional for low-risk patients, but is recommended for high-risk patients. This recommendation follows the consensus statement: Guidelines for Management of Incidental Pulmonary Nodules Detected on CT Images: From the Fleischner Society 2017; Radiology 2017; 284:228-243. 4. Unchanged chronic compression of T6 with new mild compression of T7 compared to 02/04/2024. 5. Aortic Atherosclerosis (ICD10-I70.0). Coronary artery  calcifications. Assessment for potential risk factor modification, dietary therapy or pharmacologic therapy may be warranted, if clinically indicated. Electronically Signed   By: Limin  Xu M.D.   On: 07/04/2024 14:35   DG Chest 2 View Result Date: 07/04/2024 CLINICAL DATA:  Weakness EXAM: CHEST - 2 VIEW COMPARISON:  02/08/2023 FINDINGS: Heart size is normal status post median sternotomy. Pulmonary vascular prominence without overt edema or acute airspace opacity. Disc degenerative disease of the thoracic spine. IMPRESSION: Pulmonary vascular prominence without overt edema or acute airspace opacity. Electronically Signed   By: Marolyn JONETTA Jaksch M.D.   On: 07/04/2024 11:54   CT Head Wo Contrast Result Date: 07/04/2024 CLINICAL DATA:  Dizziness and nausea for 10-12 days, headache EXAM: CT HEAD WITHOUT CONTRAST TECHNIQUE: Contiguous axial images were obtained from the base of the skull through the vertex without intravenous contrast. RADIATION DOSE REDUCTION: This exam was performed according to the departmental dose-optimization program which includes automated exposure control, adjustment of the mA and/or kV according to patient size and/or use of iterative reconstruction technique. COMPARISON:  None Available. FINDINGS: Brain: No evidence of acute infarction, hemorrhage, hydrocephalus, extra-axial collection or mass lesion/mass effect. Periventricular white matter hypodensity. Vascular: No hyperdense vessel or unexpected calcification. Skull: Normal. Negative for fracture or focal lesion. Sinuses/Orbits: No acute finding. Other: None. IMPRESSION: No acute intracranial pathology.  Small-vessel white matter disease. Electronically Signed   By: Marolyn JONETTA Jaksch M.D.   On: 07/04/2024 11:29     Procedures   Medications Ordered in the ED  ondansetron  (ZOFRAN ) injection 4 mg (4 mg Intravenous Patient Refused/Not Given 07/04/24 1256)  iohexol  (OMNIPAQUE ) 350 MG/ML injection 100 mL (75 mLs Intravenous Contrast Given  07/04/24 1324)                                    Medical Decision Making Amount and/or Complexity of Data Reviewed Labs: ordered. Radiology: ordered.  Risk Prescription drug management.   [name] [name] is here with dizziness and ongoing chest wall pain.  History of hypertension CAD.  Overall unremarkable vitals.  No fever.  She has been having some intermittent dizzy symptoms for the last 12 days or so.  She has been having ongoing pain under her breast tissues for the last several months maybe a year.  Supposed to see rheumatology for this soon.  Given her dizziness seems like could be anxiety or stress related process.  Have no concern for stroke.  Could be inner ear process but she has normal neurological exam.  Normal strength sensation and speech and visual  fields.  She is pretty anxious and frustrated about her chronic chest wall pain.  Overall I have very low suspicion for ACS PE but she is requesting a CT scan of her chest.  Ultimately will get labs including EKG troponin head CT.  She is not really having any symptoms of dizziness or nausea at this time.  Overall lab work shows no significant leukocytosis anemia or electrolyte abnormality.  Head CT is unremarkable.  Troponin normal.  Lab work and imaging overall are unremarkable.  Awaiting CT scan of her chest.  Will prescribe her Zofran  to take as needed for nausea and have her continue to follow-up with her primary care doctor and rheumatology assuming CT of the chest unremarkable.  Overall CT scan shows a few incidental findings but no PE.  She does have thoracic aortic aneurysm.  Will have her follow-up with CT surgery regarding this.  She also has some pulmonary nodules we will have her follow-up with her primary care doctor for this.  She is not having any thoracic back pain and no concern for any acute compression fracture of her thoracic spine.  Overall we will have her follow-up with primary care and specialist.  Discharged  in good condition.  This chart was dictated using voice recognition software.  Despite best efforts to proofread,  errors can occur which can change the documentation meaning.      Final diagnoses:  Pulmonary nodule  Aneurysm  Chest wall pain  Dizziness    ED Discharge Orders     None          Ruthe Cornet, DO 07/04/24 1441

## 2024-07-04 NOTE — Discharge Instructions (Addendum)
 Follow-up with cardiothoracic surgery regarding this incidental aneurysm.    Ascending thoracic aortic aneurysm measures 4.8 x 4.6 cm.  Ascending thoracic aortic aneurysm. Recommend semi-annual imaging  followup by CTA or MRA and referral to cardiothoracic surgery if not  already obtained.   Follow-up with your primary care doctor regarding your pulmonary nodules as well.  Will likely get repeat scans in the future to monitor both your aneurysms and your pulmonary nodules.  Continue follow-up with rheumatology.

## 2024-07-04 NOTE — ED Triage Notes (Signed)
 Pt c/o dizziness, with nausea today. Intermittent x 10-12 days. Went to [facility], Eval by EMS. Pt also co LT side CP under LT breast, seen by multiple providers with no ans. Pt anxious in triage. AOx4, speech clear. Talkative in triage

## 2024-07-11 DIAGNOSIS — D8989 Other specified disorders involving the immune mechanism, not elsewhere classified: Secondary | ICD-10-CM | POA: Diagnosis not present

## 2024-07-11 DIAGNOSIS — R21 Rash and other nonspecific skin eruption: Secondary | ICD-10-CM | POA: Diagnosis not present

## 2024-07-11 DIAGNOSIS — E569 Vitamin deficiency, unspecified: Secondary | ICD-10-CM | POA: Diagnosis not present

## 2024-07-11 DIAGNOSIS — R5383 Other fatigue: Secondary | ICD-10-CM | POA: Diagnosis not present

## 2024-07-11 DIAGNOSIS — R7689 Other specified abnormal immunological findings in serum: Secondary | ICD-10-CM | POA: Diagnosis not present

## 2024-07-11 DIAGNOSIS — R8290 Unspecified abnormal findings in urine: Secondary | ICD-10-CM | POA: Diagnosis not present

## 2024-07-11 DIAGNOSIS — Z6824 Body mass index (BMI) 24.0-24.9, adult: Secondary | ICD-10-CM | POA: Diagnosis not present

## 2024-07-12 DIAGNOSIS — L309 Dermatitis, unspecified: Secondary | ICD-10-CM | POA: Diagnosis not present

## 2024-07-12 DIAGNOSIS — I7121 Aneurysm of the ascending aorta, without rupture: Secondary | ICD-10-CM | POA: Diagnosis not present

## 2024-07-12 DIAGNOSIS — Z6824 Body mass index (BMI) 24.0-24.9, adult: Secondary | ICD-10-CM | POA: Diagnosis not present

## 2024-07-12 DIAGNOSIS — E7800 Pure hypercholesterolemia, unspecified: Secondary | ICD-10-CM | POA: Diagnosis not present

## 2024-07-12 DIAGNOSIS — R911 Solitary pulmonary nodule: Secondary | ICD-10-CM | POA: Diagnosis not present

## 2024-07-12 DIAGNOSIS — F419 Anxiety disorder, unspecified: Secondary | ICD-10-CM | POA: Diagnosis not present

## 2024-07-12 DIAGNOSIS — G588 Other specified mononeuropathies: Secondary | ICD-10-CM | POA: Diagnosis not present

## 2024-07-12 DIAGNOSIS — I10 Essential (primary) hypertension: Secondary | ICD-10-CM | POA: Diagnosis not present

## 2024-07-13 ENCOUNTER — Ambulatory Visit

## 2024-07-13 VITALS — BP 138/78 | HR 64 | Resp 20 | Ht 62.0 in | Wt 137.7 lb

## 2024-07-13 DIAGNOSIS — I7121 Aneurysm of the ascending aorta, without rupture: Secondary | ICD-10-CM | POA: Diagnosis not present

## 2024-07-13 DIAGNOSIS — I712 Thoracic aortic aneurysm, without rupture, unspecified: Secondary | ICD-10-CM | POA: Insufficient documentation

## 2024-07-13 NOTE — Progress Notes (Signed)
 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 This was found on CTA of chest when she presented to the emergency department on 07/04/2024 for dizziness and pain/burning sensation under her left breast.  Aneurysm measured 4.8 cm x 4.6 cm.  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   She reports that she has had a lot of stress in her life recently.  She had a rash develop and also has had pain/burning underneath her left breast for a couple months.  She has been seen by dermatology and then was referred to rheumatology for possible lupus.  She finds that when she gets very anxious her blood pressure elevates.  She knows her blood pressure is elevated because she has episodes of dizziness.  Her office blood pressure readings are usually elevated (150s-180s/90-100s).  Her home readings fluctuate depending on her anxiety.  Home readings are 110s-160s/80s-100s.  She does take amlodipine  and metoprolol  daily.  She walks 5 miles a day for exercise.  She denies chest pain, shortness of breath and lower leg edema.    Current Outpatient Medications on File Prior to Visit  Medication Sig Dispense Refill   amLODipine  (NORVASC ) 5 MG tablet Take 5 mg by mouth every evening.     aspirin  EC 81 MG tablet Take 1 tablet (81 mg total) by mouth daily. Swallow whole. 90 tablet 3   ezetimibe  (ZETIA ) 10 MG tablet Take 1 tablet (10 mg total) by mouth daily. 30 tablet 3   gabapentin  (NEURONTIN ) 100 MG capsule Take 1 capsule (100 mg total) by mouth every morning AND 1 capsule (100 mg total) daily with supper AND 2 capsules (200 mg total) at bedtime. 120 capsule 2    metoprolol  succinate (TOPROL -XL) 50 MG 24 hr tablet Take 50 mg by mouth every evening. Take with or immediately following a meal.     pantoprazole  (PROTONIX ) 20 MG tablet Take 20 mg by mouth daily.     No current facility-administered medications on file prior to visit.     ROS: Review of Systems  Constitutional:  Negative for fever and malaise/fatigue.  Respiratory:  Negative for cough and shortness of breath.   Cardiovascular:  Negative for chest pain and leg swelling.  Skin:  Positive for rash.  Neurological:  Positive for dizziness.  Psychiatric/Behavioral:  The patient is nervous/anxious.      BP 138/78 (BP Location: Left Arm, Patient Position: Sitting, Cuff Size: Normal)   Pulse 64   Resp 20   Ht 5' 2 (1.575 m)   Wt 137 lb 11.2 oz (62.5 kg)   LMP  (LMP Unknown)   SpO2 97% Comment: RA  BMI 25.19 kg/m   Physical Exam Constitutional:      Appearance: Normal appearance.  HENT:     Head: Normocephalic and atraumatic.  Cardiovascular:     Rate and Rhythm: Normal rate and regular rhythm.     Heart sounds: Normal heart sounds, S1 normal and S2 normal.  Pulmonary:     Effort: Pulmonary effort is normal.  Breath sounds: Normal breath sounds.  Musculoskeletal:     Cervical back: Normal range of motion.  Skin:    General: Skin is warm and dry.  Neurological:     General: No focal deficit present.     Mental Status: She is alert.      Imaging: CLINICAL DATA:  Left chest pain with intermittent dizziness and nausea   EXAM: CT ANGIOGRAPHY CHEST WITH CONTRAST   TECHNIQUE: Multidetector CT imaging of the chest was performed using the standard protocol during bolus administration of intravenous contrast. Multiplanar CT image reconstructions and MIPs were obtained to evaluate the vascular anatomy.   RADIATION DOSE REDUCTION: This exam was performed according to the departmental dose-optimization program which includes automated exposure control, adjustment of  the mA and/or kV according to patient size and/or use of iterative reconstruction technique.   CONTRAST:  75mL OMNIPAQUE  IOHEXOL  350 MG/ML SOLN   COMPARISON:  Same day chest radiograph, thoracic spine dated 02/04/2024   FINDINGS: Cardiovascular: The study is high quality for the evaluation of pulmonary embolism. There are no filling defects in the central, lobar, segmental or subsegmental pulmonary artery branches to suggest acute pulmonary embolism. Ascending thoracic aortic aneurysm measures 4.8 x 4.6 cm. Normal heart size. No significant pericardial fluid/thickening. Coronary artery calcifications and aortic atherosclerosis.   Mediastinum/Nodes: Imaged thyroid  gland without nodules meeting criteria for imaging follow-up by size. Normal esophagus. No pathologically enlarged axillary, supraclavicular, mediastinal, or hilar lymph nodes.   Lungs/Pleura: The central airways are patent. Left upper and lower lobe pulmonary nodules measuring up to 7 x 4 mm (8:84, 55, 85). No focal consolidation. No pneumothorax. No pleural effusion.   Upper abdomen: Normal.   Musculoskeletal: Unchanged chronic compression of T6 with new mild compression of T7 compared to 02/04/2024. Median sternotomy wires are nondisplaced.   Review of the MIP images confirms the above findings.   IMPRESSION: 1. No evidence of pulmonary embolism. 2. Ascending thoracic aortic aneurysm measures 4.8 x 4.6 cm. Ascending thoracic aortic aneurysm. Recommend semi-annual imaging followup by CTA or MRA and referral to cardiothoracic surgery if not already obtained. This recommendation follows 2010 ACCF/AHA/AATS/ACR/ASA/SCA/SCAI/SIR/STS/SVM Guidelines for the Diagnosis and Management of Patients With Thoracic Aortic Disease. Circulation. 2010; 121: Z733-z630. Aortic aneurysm NOS (ICD10-I71.9) 3. Left upper and lower lobe pulmonary nodules measuring up to 7 x 4 mm. Non-contrast chest CT at 3-6 months is recommended. If  the nodules are stable at time of repeat CT, then future CT at 18-24 months (from today's scan) is considered optional for low-risk patients, but is recommended for high-risk patients. This recommendation follows the consensus statement: Guidelines for Management of Incidental Pulmonary Nodules Detected on CT Images: From the Fleischner Society 2017; Radiology 2017; 284:228-243. 4. Unchanged chronic compression of T6 with new mild compression of T7 compared to 02/04/2024. 5. Aortic Atherosclerosis (ICD10-I70.0). Coronary artery calcifications. Assessment for potential risk factor modification, dietary therapy or pharmacologic therapy may be warranted, if clinically indicated.     Electronically Signed   By: Limin  Xu M.D.   On: 07/04/2024 14:35       A/P: Aneurysm of ascending aorta without rupture -4.8 cm x 4.6 cm ascending thoracic aortic aneurysm on CTA of chest. Echocardiogram showed tricuspid aortic valve.  We discussed the natural history and and risk factors for growth of ascending aortic aneurysms. Discussed recommendations to minimize the risk of further expansion or dissection including careful blood pressure control, avoidance of contact sports and heavy lifting, attention to lipid management.  We covered  the importance of continued smoking cessation.  The patient does not yet meet surgical criteria of >5.5cm. The patient is aware of signs and symptoms of aortic dissection and when to present to the emergency department   -Discussed at length patients blood pressure and anxiety.  Recommended she be seen by a therapist for anxiety and to discuss coping mechanisms.  She can discuss medications with her PCP.  Seems that her blood pressure correlates heavily to her anxiety.  When she arrived at visit she reported dizziness and her blood pressure was 194/110.  At end of visit after she felt calm blood pressure was 138/78 and dizziness had resolved.    -Follow up in 6 months with  CTA of chest for continued surveillance   Pulmonary Nodules -Nodules found are being followed by PCP and CT of chest ordered as recommended for follow up  Risk Modification:  Statin:  atorvastatin  Smoking cessation instruction/counseling given:  commended patient for quitting and reviewed strategies for preventing relapses  Patient was counseled on importance of Blood Pressure Control  They are instructed to contact their Primary Care Physician if they start to have blood pressure readings over 130s/90s. Do not ever stop blood pressure medications on your own, unless instructed by healthcare professional.  Please avoid use of Fluoroquinolones as this can potentially increase your risk of Aortic Rupture and/or Dissection  Patient educated on signs and symptoms of Aortic Dissection, handout also provided in AVS  Manuelita CHRISTELLA Rough, PA-C 07/13/24

## 2024-07-13 NOTE — Patient Instructions (Signed)

## 2024-07-20 DIAGNOSIS — R208 Other disturbances of skin sensation: Secondary | ICD-10-CM | POA: Diagnosis not present

## 2024-07-20 DIAGNOSIS — L931 Subacute cutaneous lupus erythematosus: Secondary | ICD-10-CM | POA: Diagnosis not present

## 2024-07-25 ENCOUNTER — Other Ambulatory Visit: Payer: Self-pay | Admitting: Neurology

## 2024-07-25 NOTE — Telephone Encounter (Signed)
 Last seen on 04/26/24 No follow up scheduled

## 2024-08-07 DIAGNOSIS — R5383 Other fatigue: Secondary | ICD-10-CM | POA: Diagnosis not present

## 2024-08-07 DIAGNOSIS — R7689 Other specified abnormal immunological findings in serum: Secondary | ICD-10-CM | POA: Diagnosis not present

## 2024-08-07 DIAGNOSIS — E663 Overweight: Secondary | ICD-10-CM | POA: Diagnosis not present

## 2024-08-07 DIAGNOSIS — Z6825 Body mass index (BMI) 25.0-25.9, adult: Secondary | ICD-10-CM | POA: Diagnosis not present

## 2024-08-07 DIAGNOSIS — R21 Rash and other nonspecific skin eruption: Secondary | ICD-10-CM | POA: Diagnosis not present

## 2024-08-07 DIAGNOSIS — L932 Other local lupus erythematosus: Secondary | ICD-10-CM | POA: Diagnosis not present

## 2024-08-21 DIAGNOSIS — L932 Other local lupus erythematosus: Secondary | ICD-10-CM | POA: Diagnosis not present

## 2024-08-21 DIAGNOSIS — L80 Vitiligo: Secondary | ICD-10-CM | POA: Diagnosis not present

## 2024-08-21 DIAGNOSIS — R208 Other disturbances of skin sensation: Secondary | ICD-10-CM | POA: Diagnosis not present

## 2024-08-21 DIAGNOSIS — L304 Erythema intertrigo: Secondary | ICD-10-CM | POA: Diagnosis not present

## 2024-08-21 DIAGNOSIS — Z872 Personal history of diseases of the skin and subcutaneous tissue: Secondary | ICD-10-CM | POA: Diagnosis not present

## 2024-08-22 ENCOUNTER — Encounter: Payer: Self-pay | Admitting: Internal Medicine

## 2024-08-31 ENCOUNTER — Ambulatory Visit: Payer: Self-pay | Admitting: Cardiology

## 2024-08-31 ENCOUNTER — Ambulatory Visit (HOSPITAL_COMMUNITY)
Admission: RE | Admit: 2024-08-31 | Discharge: 2024-08-31 | Disposition: A | Source: Ambulatory Visit | Attending: Cardiology | Admitting: Cardiology

## 2024-08-31 DIAGNOSIS — I779 Disorder of arteries and arterioles, unspecified: Secondary | ICD-10-CM | POA: Diagnosis not present

## 2024-10-18 ENCOUNTER — Other Ambulatory Visit: Payer: Self-pay

## 2024-10-18 ENCOUNTER — Emergency Department (HOSPITAL_COMMUNITY)

## 2024-10-18 ENCOUNTER — Observation Stay (HOSPITAL_COMMUNITY)
Admission: EM | Admit: 2024-10-18 | Discharge: 2024-10-20 | Disposition: A | Discharge status: Home / Self Care | Attending: Internal Medicine | Admitting: Internal Medicine

## 2024-10-18 ENCOUNTER — Encounter (HOSPITAL_COMMUNITY): Payer: Self-pay

## 2024-10-18 DIAGNOSIS — E871 Hypo-osmolality and hyponatremia: Secondary | ICD-10-CM | POA: Diagnosis not present

## 2024-10-18 DIAGNOSIS — E785 Hyperlipidemia, unspecified: Secondary | ICD-10-CM | POA: Diagnosis not present

## 2024-10-18 DIAGNOSIS — F419 Anxiety disorder, unspecified: Secondary | ICD-10-CM | POA: Insufficient documentation

## 2024-10-18 DIAGNOSIS — I10 Essential (primary) hypertension: Secondary | ICD-10-CM | POA: Diagnosis present

## 2024-10-18 DIAGNOSIS — Z7982 Long term (current) use of aspirin: Secondary | ICD-10-CM | POA: Diagnosis not present

## 2024-10-18 DIAGNOSIS — Z79899 Other long term (current) drug therapy: Secondary | ICD-10-CM | POA: Diagnosis not present

## 2024-10-18 DIAGNOSIS — E876 Hypokalemia: Secondary | ICD-10-CM | POA: Insufficient documentation

## 2024-10-18 DIAGNOSIS — K449 Diaphragmatic hernia without obstruction or gangrene: Secondary | ICD-10-CM | POA: Insufficient documentation

## 2024-10-18 DIAGNOSIS — K529 Noninfective gastroenteritis and colitis, unspecified: Principal | ICD-10-CM | POA: Insufficient documentation

## 2024-10-18 DIAGNOSIS — I712 Thoracic aortic aneurysm, without rupture, unspecified: Secondary | ICD-10-CM | POA: Insufficient documentation

## 2024-10-18 DIAGNOSIS — I700 Atherosclerosis of aorta: Secondary | ICD-10-CM | POA: Diagnosis not present

## 2024-10-18 DIAGNOSIS — R7989 Other specified abnormal findings of blood chemistry: Secondary | ICD-10-CM | POA: Diagnosis not present

## 2024-10-18 DIAGNOSIS — I2510 Atherosclerotic heart disease of native coronary artery without angina pectoris: Secondary | ICD-10-CM | POA: Insufficient documentation

## 2024-10-18 DIAGNOSIS — R7401 Elevation of levels of liver transaminase levels: Secondary | ICD-10-CM | POA: Diagnosis not present

## 2024-10-18 DIAGNOSIS — K625 Hemorrhage of anus and rectum: Secondary | ICD-10-CM | POA: Diagnosis present

## 2024-10-18 DIAGNOSIS — Z959 Presence of cardiac and vascular implant and graft, unspecified: Secondary | ICD-10-CM | POA: Insufficient documentation

## 2024-10-18 DIAGNOSIS — Z951 Presence of aortocoronary bypass graft: Secondary | ICD-10-CM

## 2024-10-18 LAB — MAGNESIUM: Magnesium: 1.9 mg/dL (ref 1.7–2.4)

## 2024-10-18 LAB — COMPREHENSIVE METABOLIC PANEL WITH GFR
ALT: 123 U/L — ABNORMAL HIGH (ref 0–44)
AST: 77 U/L — ABNORMAL HIGH (ref 15–41)
Albumin: 4.2 g/dL (ref 3.5–5.0)
Alkaline Phosphatase: 70 U/L (ref 38–126)
Anion gap: 15 (ref 5–15)
BUN: 12 mg/dL (ref 8–23)
CO2: 20 mmol/L — ABNORMAL LOW (ref 22–32)
Calcium: 9 mg/dL (ref 8.9–10.3)
Chloride: 86 mmol/L — ABNORMAL LOW (ref 98–111)
Creatinine, Ser: 0.74 mg/dL (ref 0.44–1.00)
GFR, Estimated: >60 mL/min
Glucose, Bld: 135 mg/dL — ABNORMAL HIGH (ref 70–99)
Potassium: 3.3 mmol/L — ABNORMAL LOW (ref 3.5–5.1)
Sodium: 121 mmol/L — ABNORMAL LOW (ref 135–145)
Total Bilirubin: 0.6 mg/dL (ref 0.0–1.2)
Total Protein: 8 g/dL (ref 6.5–8.1)

## 2024-10-18 LAB — CBC
HCT: 42.8 % (ref 36.0–46.0)
Hemoglobin: 15.1 g/dL — ABNORMAL HIGH (ref 12.0–15.0)
MCH: 29.2 pg (ref 26.0–34.0)
MCHC: 35.3 g/dL (ref 30.0–36.0)
MCV: 82.6 fL (ref 80.0–100.0)
Platelets: 258 K/uL (ref 150–400)
RBC: 5.18 MIL/uL — ABNORMAL HIGH (ref 3.87–5.11)
RDW: 13.1 % (ref 11.5–15.5)
WBC: 14.3 K/uL — ABNORMAL HIGH (ref 4.0–10.5)
nRBC: 0 % (ref 0.0–0.2)

## 2024-10-18 LAB — POC OCCULT BLOOD, ED: Fecal Occult Bld: POSITIVE — AB

## 2024-10-18 LAB — HEMOGLOBIN AND HEMATOCRIT, BLOOD
HCT: 42.7 % (ref 36.0–46.0)
HCT: 41.9 % (ref 36.0–46.0)
Hemoglobin: 14.9 g/dL (ref 12.0–15.0)
Hemoglobin: 14.7 g/dL (ref 12.0–15.0)

## 2024-10-18 LAB — PROTIME-INR
INR: 1 (ref 0.8–1.2)
Prothrombin Time: 13.6 s (ref 11.4–15.2)

## 2024-10-18 LAB — LACTIC ACID, PLASMA: Lactic Acid, Venous: 1.4 mmol/L (ref 0.5–1.9)

## 2024-10-18 LAB — OSMOLALITY: Osmolality: 292 mosm/kg (ref 275–295)

## 2024-10-18 LAB — PHOSPHORUS: Phosphorus: 3.8 mg/dL (ref 2.5–4.6)

## 2024-10-18 MED ORDER — POTASSIUM CHLORIDE CRYS ER 20 MEQ PO TBCR
40.0000 meq | EXTENDED_RELEASE_TABLET | Freq: Once | ORAL | Status: AC
  Administered 2024-10-18: 40 meq via ORAL
  Filled 2024-10-18: qty 2

## 2024-10-18 MED ORDER — SODIUM CHLORIDE 0.9 % IV SOLN: INTRAVENOUS | Status: AC

## 2024-10-18 MED ORDER — AMLODIPINE BESYLATE 5 MG PO TABS
5.0000 mg | ORAL_TABLET | Freq: Every day | ORAL | Status: DC
  Administered 2024-10-19 – 2024-10-20 (×2): 5 mg via ORAL
  Filled 2024-10-18 (×2): qty 1

## 2024-10-18 MED ORDER — BISACODYL 5 MG PO TBEC: 5.0000 mg | DELAYED_RELEASE_TABLET | Freq: Every day | ORAL | Status: DC | PRN

## 2024-10-18 MED ORDER — EZETIMIBE 10 MG PO TABS
10.0000 mg | ORAL_TABLET | Freq: Every day | ORAL | Status: DC
  Administered 2024-10-18 – 2024-10-19 (×2): 10 mg via ORAL
  Filled 2024-10-18 (×3): qty 1

## 2024-10-18 MED ORDER — SODIUM CHLORIDE 0.9 % IV BOLUS
1000.0000 mL | Freq: Once | INTRAVENOUS | Status: AC
  Administered 2024-10-18: 1000 mL via INTRAVENOUS

## 2024-10-18 MED ORDER — OXYCODONE HCL 5 MG PO TABS: 5.0000 mg | ORAL_TABLET | ORAL | Status: DC | PRN

## 2024-10-18 MED ORDER — ACETAMINOPHEN 650 MG RE SUPP: 650.0000 mg | Freq: Four times a day (QID) | RECTAL | Status: DC | PRN

## 2024-10-18 MED ORDER — HYDRALAZINE HCL 20 MG/ML IJ SOLN: 10.0000 mg | INTRAMUSCULAR | Status: DC | PRN

## 2024-10-18 MED ORDER — IPRATROPIUM BROMIDE 0.02 % IN SOLN: 0.5000 mg | Freq: Four times a day (QID) | RESPIRATORY_TRACT | Status: DC | PRN

## 2024-10-18 MED ORDER — MORPHINE SULFATE (PF) 2 MG/ML IV SOLN
2.0000 mg | Freq: Once | INTRAVENOUS | Status: AC
  Administered 2024-10-18: 2 mg via INTRAVENOUS
  Filled 2024-10-18: qty 1

## 2024-10-18 MED ORDER — FLORANEX PO PACK
1.0000 g | PACK | Freq: Three times a day (TID) | ORAL | Status: DC
  Administered 2024-10-18 – 2024-10-20 (×6): 1 g via ORAL
  Filled 2024-10-18 (×7): qty 1

## 2024-10-18 MED ORDER — SODIUM CHLORIDE 0.9 % IV SOLN
2.0000 g | INTRAVENOUS | Status: DC
  Administered 2024-10-19 – 2024-10-20 (×2): 2 g via INTRAVENOUS
  Filled 2024-10-18 (×2): qty 20

## 2024-10-18 MED ORDER — SODIUM CHLORIDE 0.9% FLUSH: 3.0000 mL | Freq: Two times a day (BID) | INTRAVENOUS | Status: DC

## 2024-10-18 MED ORDER — FLEET ENEMA RE ENEM: 1.0000 | ENEMA | Freq: Once | RECTAL | Status: DC | PRN

## 2024-10-18 MED ORDER — TRAZODONE HCL 50 MG PO TABS: 25.0000 mg | ORAL_TABLET | Freq: Every evening | ORAL | Status: DC | PRN

## 2024-10-18 MED ORDER — METOPROLOL SUCCINATE ER 50 MG PO TB24
50.0000 mg | ORAL_TABLET | Freq: Every day | ORAL | Status: DC
  Administered 2024-10-19 – 2024-10-20 (×2): 50 mg via ORAL
  Filled 2024-10-18 (×2): qty 1

## 2024-10-18 MED ORDER — SODIUM CHLORIDE 1 G PO TABS
1.0000 g | ORAL_TABLET | Freq: Two times a day (BID) | ORAL | Status: DC
  Administered 2024-10-18 – 2024-10-20 (×4): 1 g via ORAL
  Filled 2024-10-18 (×6): qty 1

## 2024-10-18 MED ORDER — METRONIDAZOLE 500 MG/100ML IV SOLN
500.0000 mg | Freq: Two times a day (BID) | INTRAVENOUS | Status: DC
  Administered 2024-10-18 – 2024-10-20 (×4): 500 mg via INTRAVENOUS
  Filled 2024-10-18 (×4): qty 100

## 2024-10-18 MED ORDER — ONDANSETRON HCL 4 MG PO TABS: 4.0000 mg | ORAL_TABLET | Freq: Four times a day (QID) | ORAL | Status: DC | PRN

## 2024-10-18 MED ORDER — ACETAMINOPHEN 325 MG PO TABS: 650.0000 mg | ORAL_TABLET | Freq: Four times a day (QID) | ORAL | Status: DC | PRN

## 2024-10-18 MED ORDER — SENNOSIDES-DOCUSATE SODIUM 8.6-50 MG PO TABS: 1.0000 | ORAL_TABLET | Freq: Every evening | ORAL | Status: DC | PRN

## 2024-10-18 MED ORDER — HYDROMORPHONE HCL 1 MG/ML IJ SOLN: 0.5000 mg | INTRAMUSCULAR | Status: DC | PRN

## 2024-10-18 MED ORDER — METRONIDAZOLE 500 MG/100ML IV SOLN
500.0000 mg | Freq: Once | INTRAVENOUS | Status: AC
  Administered 2024-10-18: 500 mg via INTRAVENOUS
  Filled 2024-10-18: qty 100

## 2024-10-18 MED ORDER — SODIUM CHLORIDE 0.9 % IV SOLN: 3.0000 g | Freq: Once | INTRAVENOUS | Status: DC

## 2024-10-18 MED ORDER — IOHEXOL 350 MG/ML SOLN
75.0000 mL | Freq: Once | INTRAVENOUS | Status: AC | PRN
  Administered 2024-10-18: 75 mL via INTRAVENOUS

## 2024-10-18 MED ORDER — SODIUM CHLORIDE 0.9 % IV SOLN
2.0000 g | Freq: Once | INTRAVENOUS | Status: AC
  Administered 2024-10-18: 2 g via INTRAVENOUS
  Filled 2024-10-18: qty 20

## 2024-10-18 MED ORDER — ONDANSETRON HCL 4 MG/2ML IJ SOLN
4.0000 mg | Freq: Once | INTRAMUSCULAR | Status: AC
  Administered 2024-10-18: 4 mg via INTRAVENOUS
  Filled 2024-10-18: qty 2

## 2024-10-18 MED ORDER — SODIUM CHLORIDE 0.9% FLUSH
3.0000 mL | Freq: Two times a day (BID) | INTRAVENOUS | Status: DC
  Administered 2024-10-18 – 2024-10-20 (×5): 3 mL via INTRAVENOUS

## 2024-10-18 MED ORDER — ONDANSETRON HCL 4 MG/2ML IJ SOLN: 4.0000 mg | Freq: Four times a day (QID) | INTRAMUSCULAR | Status: DC | PRN

## 2024-10-18 NOTE — Consult Note (Addendum)
 "                                               Consultation Note   Referring Provider:   [facility] PCP: Cleotilde Planas, MD Primary Gastroenterologist:  [facility]     Reason for Consultation:  Blood in stool, abnormal colon on CT DOA: 10/18/2024         Hospital Day: 1   Assessment::   #79 yo female with acute lower abdominal pain, bowel changes with blood in stool and CT scan showing wall thickening and submucosal edema of the descending and proximal to mid sigmoid colon.    Given acute onset suspect infectious, but ischemic not excluded. She did have a few episodes of loose stool a few days ago. However her main complaint is the severe constipation and severe lower abdominal pain prior to the onset of GI bleeding yesterday.    # Hyponatremia  /  Hypochloremia 2/2 to recent excessive water intake? GI losses?    # History of autoimmune hepatitis, off treatment Elevated liver enzymes ( mild) AST 77 / ALT 123 Off azathioprine  for the last several years with normal liver enzymes.  Now with mildly elevated liver enzymes in the in setting of acute illness.   # GERD Hiatal hernia Takes low dose pantoprazole  at home  # See [facility] for any additional medical history  / medical problems   Plan:  -- Continue Cipro Flagyl  x 5 days -- Agree with stool studies if able to collect.  Patient has not had any bowel movements today.  She took loperamide prior to coming to the hospital so possible she will not be able to submit sample -- If no general medical concerns patient could possibly go home tomorrow from GI standpoint if no further bleeding, she is tolerating a diet and not have any significant abdominal pain -- She is going to need office follow-up with us  and eventual colonoscopy which we did discuss today -- We discussed her history of autoimmune hepatitis (she is not convinced that she has autoimmune hepatitis).  I made her aware that her liver enzymes are high today  though this is in the setting of an acute illness.  We can repeat liver tests when we see her in the office.  Depending on the trend she may need to get reconnected with Dr. Wyatt  *During the consultation I learned that patient was followed by [facility] a few years ago. She adamantly refuses to be seen by anyone in the practice again. Since this consult has already done we will assume her care as outpatient   Principal Problem:   Colitis Active Problems:   S/P CABG x 4   Coronary artery disease   Anxiety   Hyperlipidemia   Essential hypertension   Thoracic aortic aneurysm (TAA)   Hyponatremia   Hypokalemia   Transaminitis    HPI:   Brief History:   Patient is a 79 yo female with a medical history of not limited to CAD, CABG, HTN, AAA, autoimmune hepatitis.  Patient presents to the emergency department for evaluation of blood in stool .  She is struggling to give details due to being tired , not feeling well and not having had much to eat .  A few days ago shet had several episodes of loose stool which is unusual  for her.  She typically has a formed stool almost every day. The next day she woke up constipated , unable to have a bowel movement .  She drank   a whole lot of water then went to the park to walk to try and make her bowels move .  She used the bathroom at the park trying to have a bowel movement without success.  When she got home she sat on the toilet , developed severe lower abdominal pain but finally was able to have a bowel movement .  Following this she began having passing blood.  She had several episodes of lower GI bleeding through the night .  Though she was not having diarrhea she took loperamide . She developed nausea for which she took Zofran .  She decided to come to the emergency department this morning and while in the emergency department she did have an episode of vomiting.  Since arrival to the ED she has not had any further GI bleeding. In ED, she was afebrile but  with leukocytosis. CT scan suggested submucosal edema and wall thickening in the descending and portions of the sigmoid colon .  Patient has not had any recent antibiotics.  Last time she took antibiotics was in August for a rash on her back   Workup notable for:   VSS, afebrile.   WBC 14, Hgb 15, K+ 121  CT AP w contrast:  1. Wall thickening and submucosal edema of the descending and proximal to mid sigmoid colon with surrounding pericolonic haziness, favored to reflect a nonspecific colitis. Sigmoid colonic diverticulosis with mildly inflamed appearance of a mid sigmoid colonic diverticulum, which may be reactive to colitis. No evidence of perforation or abscess. 2. Small-to-moderate hiatal hernia. 3. Aortic atherosclerosis.    Unrelated, several years ago then it was found to have elevated liver enzymes.  She was referred to Dr. Wyatt at [facility] who followed her for several years and treated her with azathioprine .  Her liver enzymes normalized on treatment . The plan was to be on azathioprine  for 3 years.  However patient discontinued azathioprine  early due to having a MI and needing a CABG. She has not seen Dr. Wyatt since 2021.  Her liver enzymes have remained normal off treatment until today  Most recent Endoscopic Procedures:   Beena tells me she has never had a colonoscopy.  No known family history of colon cancer   Recent Labs    10/18/24 0920  PROT 8.0  ALBUMIN  4.2  AST 77*  ALT 123*  ALKPHOS 70  BILITOT 0.6   Recent Labs    10/18/24 0920  WBC 14.3*  HGB 15.1*  HCT 42.8  MCV 82.6  PLT 258   Recent Labs    10/18/24 0920  NA 121*  K 3.3*  CL 86*  CO2 20*  GLUCOSE 135*  BUN 12  CREATININE 0.74  CALCIUM 9.0    Review of Systems: All systems reviewed and negative except where noted in HPI.  Physical Exam: Vital signs in last 24 hours: Temp:  [97.8 F (36.6 C)-98 F (36.7 C)] 97.8 F (36.6 C) (01/21 1258) Pulse Rate:  [79-87] 87 (01/21 1120) Resp:   [17-27] 25 (01/21 1120) BP: (106-164)/(56-100) 164/56 (01/21 1120) SpO2:  [99 %-100 %] 99 % (01/21 1120) Weight:  [61.7 kg] 61.7 kg (01/21 0912)   General:  Pleasant female in NAD Psych:  Cooperative. Normal mood and affect Eyes: Pupils equal Ears:  Normal auditory acuity Nose: No deformity,  discharge or lesions Neck:  Supple, no masses felt Lungs:  Clear to auscultation.  Heart:  Regular rate, regular rhythm.  Abdomen:  Soft, nondistended, nontender, active bowel sounds, no masses felt Rectal :  Deferred Msk: Symmetrical without gross deformities.  Neurologic:  Alert, oriented, grossly normal neurologically Extremities : No edema Skin:  Intact without significant lesions.   OUTPATIENT MEDICATIONS Prior to Admission medications  Medication Sig Start Date End Date Taking? Authorizing Provider  amLODipine  (NORVASC ) 5 MG tablet Take 5 mg by mouth every morning. 04/21/23  Yes [provider]  aspirin  EC 81 MG tablet Take 1 tablet (81 mg total) by mouth daily. Swallow whole. 11/25/21  Yes Hobart Powell BRAVO, MD  ezetimibe  (ZETIA ) 10 MG tablet Take 1 tablet (10 mg total) by mouth daily. 09/13/21  Yes Barrett, Erin R, PA-C  metoprolol  succinate (TOPROL -XL) 50 MG 24 hr tablet Take 50 mg by mouth every morning. Take with or immediately following a meal.   Yes [provider]  pantoprazole  (PROTONIX ) 20 MG tablet Take 20 mg by mouth every evening. 03/07/23  Yes [provider]  gabapentin  (NEURONTIN ) 100 MG capsule TAKE 1 CAPSULE BY MOUTH EVERY MORNING AND 1 CAPSULE DAILY WITH SUPPER AND 2 CAPSULES AT BEDTIME. Patient not taking: Reported on 10/18/2024 07/25/24   Vear Charlie LABOR, MD    Allergies as of 10/18/2024 - Review Complete 10/18/2024  Allergen Reaction Noted   Quinolones Other (See Comments) 07/13/2024   Budesonide Rash 08/29/2020    INPATIENT MEDICATIONS Current Facility-Administered Medications  Medication Dose Route Frequency Provider Last Rate Last  Admin   0.9 %  sodium chloride  infusion   Intravenous Continuous Shahmehdi, Seyed A, MD       acetaminophen  (TYLENOL ) tablet 650 mg  650 mg Oral Q6H PRN Shahmehdi, Seyed A, MD       Or   acetaminophen  (TYLENOL ) suppository 650 mg  650 mg Rectal Q6H PRN Shahmehdi, Seyed A, MD       amLODipine  (NORVASC ) tablet 5 mg  5 mg Oral Daily Shahmehdi, Seyed A, MD       bisacodyl  (DULCOLAX) EC tablet 5 mg  5 mg Oral Daily PRN Shahmehdi, Seyed A, MD       ezetimibe  (ZETIA ) tablet 10 mg  10 mg Oral Daily Shahmehdi, Seyed A, MD       hydrALAZINE  (APRESOLINE ) injection 10 mg  10 mg Intravenous Q4H PRN Shahmehdi, Seyed A, MD       HYDROmorphone  (DILAUDID ) injection 0.5-1 mg  0.5-1 mg Intravenous Q2H PRN Shahmehdi, Seyed A, MD       ipratropium (ATROVENT ) nebulizer solution 0.5 mg  0.5 mg Nebulization Q6H PRN Shahmehdi, Seyed A, MD       lactobacillus (FLORANEX/LACTINEX) granules 1 g  1 g Oral TID WC Shahmehdi, Seyed A, MD       metoprolol  succinate (TOPROL -XL) 24 hr tablet 50 mg  50 mg Oral Daily Shahmehdi, Seyed A, MD       metroNIDAZOLE  (FLAGYL ) IVPB 500 mg  500 mg Intravenous Q12H Shahmehdi, Seyed A, MD       ondansetron  (ZOFRAN ) tablet 4 mg  4 mg Oral Q6H PRN Shahmehdi, Seyed A, MD       Or   ondansetron  (ZOFRAN ) injection 4 mg  4 mg Intravenous Q6H PRN Shahmehdi, Seyed A, MD       oxyCODONE  (Oxy IR/ROXICODONE ) immediate release tablet 5 mg  5 mg Oral Q4H PRN Willette Adriana LABOR, MD  potassium chloride  SA (KLOR-CON  M) CR tablet 40 mEq  40 mEq Oral Once Shahmehdi, Seyed A, MD       senna-docusate (Senokot-S) tablet 1 tablet  1 tablet Oral QHS PRN Shahmehdi, Seyed A, MD       sodium chloride  flush (NS) 0.9 % injection 3 mL  3 mL Intravenous Q12H Shahmehdi, Seyed A, MD   3 mL at 10/18/24 1130   sodium chloride  tablet 1 g  1 g Oral BID WC Shahmehdi, Seyed A, MD       traZODone  (DESYREL ) tablet 25 mg  25 mg Oral QHS PRN Shahmehdi, Adriana LABOR, MD       Current Outpatient Medications  Medication Sig Dispense  Refill   amLODipine  (NORVASC ) 5 MG tablet Take 5 mg by mouth every morning.     aspirin  EC 81 MG tablet Take 1 tablet (81 mg total) by mouth daily. Swallow whole. 90 tablet 3   ezetimibe  (ZETIA ) 10 MG tablet Take 1 tablet (10 mg total) by mouth daily. 30 tablet 3   metoprolol  succinate (TOPROL -XL) 50 MG 24 hr tablet Take 50 mg by mouth every morning. Take with or immediately following a meal.     pantoprazole  (PROTONIX ) 20 MG tablet Take 20 mg by mouth every evening.     gabapentin  (NEURONTIN ) 100 MG capsule TAKE 1 CAPSULE BY MOUTH EVERY MORNING AND 1 CAPSULE DAILY WITH SUPPER AND 2 CAPSULES AT BEDTIME. (Patient not taking: Reported on 10/18/2024) 120 capsule 2     Past Medical History:  Diagnosis Date   Anxiety    Hepatitis     Past Surgical History:  Procedure Laterality Date   CORONARY ARTERY BYPASS GRAFT N/A 09/08/2021   Procedure: CORONARY ARTERY BYPASS GRAFTING (CABG) X 4  USING LEFT INTERNAL MAMMARY ARTERY AND RIGHT ENDOSCOPIC GREATER SAPHENOUS VEIN CONDUITS.;  Surgeon: Lucas Dorise POUR, MD;  Location: [facility] OR;  Service: Open Heart Surgery;  Laterality: N/A;   ENDOVEIN HARVEST OF GREATER SAPHENOUS VEIN Right 09/08/2021   Procedure: ENDOVEIN HARVEST OF GREATER SAPHENOUS VEIN;  Surgeon: Lucas Dorise POUR, MD;  Location: [facility] OR;  Service: Open Heart Surgery;  Laterality: Right;   LEFT HEART CATH AND CORONARY ANGIOGRAPHY N/A 09/01/2021   Procedure: LEFT HEART CATH AND CORONARY ANGIOGRAPHY;  Surgeon: Mady Bruckner, MD;  Location: [facility] INVASIVE CV LAB;  Service: Cardiovascular;  Laterality: N/A;   TEE WITHOUT CARDIOVERSION N/A 09/08/2021   Procedure: TRANSESOPHAGEAL ECHOCARDIOGRAM (TEE);  Surgeon: Lucas Dorise POUR, MD;  Location: [facility] OR;  Service: Open Heart Surgery;  Laterality: N/A;    Family History  Problem Relation Age of Onset   Premature CHD Father     Social History   Socioeconomic History   Marital status: Single    Spouse name: Not on file   Number of children: Not on file    Years of education: Not on file   Highest education level: Not on file  Occupational History   Not on file  Tobacco Use   Smoking status: Former    Current packs/day: 1.50    Average packs/day: 1.5 packs/day for 30.0 years (45.0 ttl pk-yrs)    Types: Cigarettes   Smokeless tobacco: Never  Vaping Use   Vaping status: Never Used  Substance and Sexual Activity   Alcohol use: Not Currently   Drug use: Never   Sexual activity: Not Currently  Other Topics Concern   Not on file  Social History Narrative   Not on file   Social Drivers of Health  Tobacco Use: Medium Risk (10/18/2024)   Patient History    Smoking Tobacco Use: Former    Smokeless Tobacco Use: Never    Passive Exposure: Not on Actuary Strain: Not on file  Food Insecurity: Not on file  Transportation Needs: Not on file  Physical Activity: Not on file  Stress: Not on file  Social Connections: Not on file  Intimate Partner Violence: Not on file  Depression (EYV7-0): Not on file  Alcohol Screen: Not on file  Housing: Not on file  Utilities: Not on file  Health Literacy: Not on file    Code Status   Code Status: Full Code   Vina Dasen, NP-C   10/18/2024, 1:35 PM     "

## 2024-10-18 NOTE — Assessment & Plan Note (Signed)
-   Stable -Will continue metoprolol , Norvasc

## 2024-10-18 NOTE — Assessment & Plan Note (Signed)
 Abdominal pain, associated with colitis post constipation WBC 14.3, Hura 15.1, Hemoccult positive,  CT abdomen pelvis:  Wall thickening and submucosal edema of the descending and proximal to mid sigmoid colon with surrounding pericolonic haziness, favored to reflect a nonspecific colitis. Sigmoid colonic diverticulosis with mildly inflamed appearance of a mid sigmoid colonic diverticulum, which may be reactive to colitis. No evidence of perforation or abscess.  - WBC of 14.3, - Hemoccult positive -Patient started on Rocephin  and Flagyl  will continue for now  If any further diarrhea will screen for C. Difficile (low risk no recent use of antibiotics or sick contact)

## 2024-10-18 NOTE — Assessment & Plan Note (Signed)
 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

## 2024-10-18 NOTE — Hospital Course (Addendum)
 [name] [name] is a 79 year old female CAD, CABG x 4, HTN/ HLD, AAA 4.8 cm, GERD, who has been experiencing abdominal pain, constipation since yesterday,  which after ingestion of lots of water finally had a bowel movement then started noticing bright red blood per rectum.  Patient reports she has had few loose stools and has been bloody approximately 4 loose stool since yesterday. Reports pain is improving in lower abdominal area.  Is not associate with any nausea or vomiting.  Has not had any fever or chills.  ED Evaluation: Blood pressure (!) 164/56, pulse 87, temperature 98 F (36.7 C), temperature source Axillary, resp. rate (!) 25, height 5' 2 (1.575 m), weight 61.7 kg, SpO2 99%.  LABs: Sodium 121, potassium 3.3, CL 86, CO2 20, glucose 135, AST 77, ALT 123, WBC 14.3, Hura 15.1, Hemoccult positive,  CT abdomen pelvis:  Wall thickening and submucosal edema of the descending and proximal to mid sigmoid colon with surrounding pericolonic haziness, favored to reflect a nonspecific colitis. Sigmoid colonic diverticulosis with mildly inflamed appearance of a mid sigmoid colonic diverticulum, which may be reactive to colitis. No evidence of perforation or abscess. 2. Small-to-moderate hiatal hernia. 3. Aortic atherosclerosis  Requested for patient to be admitted for treatment of colitis, hide monitor for bloody BMs may be related to colitis.

## 2024-10-18 NOTE — ED Triage Notes (Signed)
 Pt BIB [facility] from home d/t recent constipation tht led into noticing bright red blood with her last BM at 1730 yesterday evening. After that BM she began having diarrhea with more blood. A/Ox4, no thinners, does report feeling anxious & states she has nerve pain under her breasts with some lower abd pain rated 5/10. 190/102, 78 bpm, 98% on RA. States she has not taken any of her morning meds today.

## 2024-10-18 NOTE — Assessment & Plan Note (Signed)
 Last CT October 2025, thoracic aortic aneurysm 4.8 -Biannual CT monitoring recommended

## 2024-10-18 NOTE — Assessment & Plan Note (Signed)
 History of coronary artery disease, CABG x 4 in 2022 -Denies any chest pain -Currently stable, continue home medication -Intolerance to statins (due to autoimmune disease per patient) - Aspirin  due to bloody bowel movements

## 2024-10-18 NOTE — Assessment & Plan Note (Signed)
 Serum potassium 3.3, replating orally, checking magnesium  level

## 2024-10-18 NOTE — Plan of Care (Signed)

## 2024-10-18 NOTE — Assessment & Plan Note (Signed)
 Stable continue home medication with exception of aspirin  on hold for lower GI bleed,

## 2024-10-18 NOTE — Assessment & Plan Note (Signed)
 Mild with AST of 77, ALT 123 -Hepatitis panel -Denies ever having any fever or chills -Avoiding hepatotoxic, monitoring LFTs closely

## 2024-10-18 NOTE — H&P (Addendum)
 " History and Physical   Patient: [name] [name]                            PCP: Cleotilde Planas, MD                    DOB: 06/01/1946                       DOA: 10/18/2024 FMW:[id number]                        DOS: 10/18/2024, 12:31 PM  Cleotilde Planas, MD  Patient coming from:   HOME  I have personally reviewed patient's medical records, in electronic medical records, including:  [place] link, and care everywhere.    Chief Complaint:   Chief Complaint  Patient presents with   Rectal Bleeding    History of present illness:    [name] [name] is a 79 year old female CAD, CABG x 4, HTN/ HLD, AAA 4.8 cm, GERD, who has been experiencing abdominal pain, constipation since yesterday,  which after ingestion of lots of water finally had a bowel movement then started noticing bright red blood per rectum.  Patient reports she has had few loose stools and has been bloody approximately 4 loose stool since yesterday. Reports pain is improving in lower abdominal area.  Is not associate with any nausea or vomiting.  Has not had any fever or chills.  ED Evaluation: Blood pressure (!) 164/56, pulse 87, temperature 98 F (36.7 C), temperature source Axillary, resp. rate (!) 25, height 5' 2 (1.575 m), weight 61.7 kg, SpO2 99%.  LABs: Sodium 121, potassium 3.3, CL 86, CO2 20, glucose 135, AST 77, ALT 123, WBC 14.3, Hura 15.1, Hemoccult positive,  CT abdomen pelvis:  Wall thickening and submucosal edema of the descending and proximal to mid sigmoid colon with surrounding pericolonic haziness, favored to reflect a nonspecific colitis. Sigmoid colonic diverticulosis with mildly inflamed appearance of a mid sigmoid colonic diverticulum, which may be reactive to colitis. No evidence of perforation or abscess. 2. Small-to-moderate hiatal hernia. 3. Aortic atherosclerosis  Requested for patient to be admitted for treatment of colitis, hide monitor for bloody BMs may be related to colitis.     Patient  Denies having: Fever, Chills, Cough, SOB, Chest Pain,  N/V/D, headache, dizziness, lightheadedness,  Dysuria, Joint pain, rash, open wounds   Review of Systems: As per HPI, otherwise 10 point review of systems were negative.   ----------------------------------------------------------------------------------------------------------------------  Allergies[1]  Home MEDs:  Prior to Admission medications  Medication Sig Start Date End Date Taking? Authorizing Provider  amLODipine  (NORVASC ) 5 MG tablet Take 5 mg by mouth every morning. 04/21/23  Yes [provider]  aspirin  EC 81 MG tablet Take 1 tablet (81 mg total) by mouth daily. Swallow whole. 11/25/21  Yes Hobart Powell BRAVO, MD  ezetimibe  (ZETIA ) 10 MG tablet Take 1 tablet (10 mg total) by mouth daily. 09/13/21  Yes Barrett, Erin R, PA-C  metoprolol  succinate (TOPROL -XL) 50 MG 24 hr tablet Take 50 mg by mouth every morning. Take with or immediately following a meal.   Yes [provider]  pantoprazole  (PROTONIX ) 20 MG tablet Take 20 mg by mouth every evening. 03/07/23  Yes [provider]  gabapentin  (NEURONTIN ) 100 MG capsule TAKE 1 CAPSULE BY MOUTH EVERY MORNING AND 1 CAPSULE DAILY WITH SUPPER AND 2 CAPSULES  AT BEDTIME. Patient not taking: Reported on 10/18/2024 07/25/24   Sater, Charlie LABOR, MD    PRN MEDs: acetaminophen  **OR** acetaminophen , bisacodyl , hydrALAZINE , HYDROmorphone  (DILAUDID ) injection, ipratropium, ondansetron  **OR** ondansetron  (ZOFRAN ) IV, oxyCODONE , senna-docusate, traZODone   Past Medical History:  Diagnosis Date   Anxiety    Hepatitis     Past Surgical History:  Procedure Laterality Date   CORONARY ARTERY BYPASS GRAFT N/A 09/08/2021   Procedure: CORONARY ARTERY BYPASS GRAFTING (CABG) X 4  USING LEFT INTERNAL MAMMARY ARTERY AND RIGHT ENDOSCOPIC GREATER SAPHENOUS VEIN CONDUITS.;  Surgeon: Lucas Dorise POUR, MD;  Location: [facility] OR;  Service: Open Heart Surgery;  Laterality: N/A;   ENDOVEIN  HARVEST OF GREATER SAPHENOUS VEIN Right 09/08/2021   Procedure: ENDOVEIN HARVEST OF GREATER SAPHENOUS VEIN;  Surgeon: Lucas Dorise POUR, MD;  Location: [facility] OR;  Service: Open Heart Surgery;  Laterality: Right;   LEFT HEART CATH AND CORONARY ANGIOGRAPHY N/A 09/01/2021   Procedure: LEFT HEART CATH AND CORONARY ANGIOGRAPHY;  Surgeon: Mady Bruckner, MD;  Location: [facility] INVASIVE CV LAB;  Service: Cardiovascular;  Laterality: N/A;   TEE WITHOUT CARDIOVERSION N/A 09/08/2021   Procedure: TRANSESOPHAGEAL ECHOCARDIOGRAM (TEE);  Surgeon: Lucas Dorise POUR, MD;  Location: [facility] OR;  Service: Open Heart Surgery;  Laterality: N/A;     reports that she has quit smoking. Her smoking use included cigarettes. She has a 45 pack-year smoking history. She has never used smokeless tobacco. She reports that she does not currently use alcohol. She reports that she does not use drugs.   Family History  Problem Relation Age of Onset   Premature CHD Father     Physical Exam:   Vitals:   10/18/24 0930 10/18/24 0945 10/18/24 1000 10/18/24 1120  BP: (!) 152/92 (!) 161/87 (!) 158/79 (!) 164/56  Pulse: 79 83 84 87  Resp: 19 (!) 22 (!) 24 (!) 25  Temp:      TempSrc:      SpO2: 100% 100% 100% 99%  Weight:      Height:       Constitutional: NAD, calm, comfortable Eyes: PERRL, lids and conjunctivae normal ENMT: Mucous membranes are moist. Posterior pharynx clear of any exudate or lesions.Normal dentition.  Neck: normal, supple, no masses, no thyromegaly Respiratory: clear to auscultation bilaterally, no wheezing, no crackles. Normal respiratory effort. No accessory muscle use.  Cardiovascular: Regular rate and rhythm, no murmurs / rubs / gallops. No extremity edema. 2+ pedal pulses. No carotid bruits.  Abdomen: no tenderness, no masses palpated. No hepatosplenomegaly. Bowel sounds positive.  Musculoskeletal: no clubbing / cyanosis. No joint deformity upper and lower extremities. Good ROM, no contractures. Normal muscle  tone.  Neurologic: CN II-XII grossly intact. Sensation intact, DTR normal. Strength 5/5 in all 4.  Psychiatric: Normal judgment and insight. Alert and oriented x 3. Normal mood.  Skin: no rashes, lesions, ulcers. No induration    Labs on admission:    I have personally reviewed following labs and imaging studies  CBC: Recent Labs  Lab 10/18/24 0920  WBC 14.3*  HGB 15.1*  HCT 42.8  MCV 82.6  PLT 258   Basic Metabolic Panel: Recent Labs  Lab 10/18/24 0920  NA 121*  K 3.3*  CL 86*  CO2 20*  GLUCOSE 135*  BUN 12  CREATININE 0.74  CALCIUM 9.0   GFR: Estimated Creatinine Clearance: 50.9 mL/min (by C-G formula based on SCr of 0.74 mg/dL). Liver Function Tests: Recent Labs  Lab 10/18/24 0920  AST 77*  ALT 123*  ALKPHOS  70  BILITOT 0.6  PROT 8.0  ALBUMIN  4.2    Urine analysis:    Component Value Date/Time   COLORURINE YELLOW 09/19/2021 1403   APPEARANCEUR CLEAR 09/19/2021 1403   LABSPEC <1.005 (L) 09/19/2021 1403   PHURINE 6.0 09/19/2021 1403   GLUCOSEU NEGATIVE 09/19/2021 1403   HGBUR SMALL (A) 09/19/2021 1403   BILIRUBINUR NEGATIVE 09/19/2021 1403   KETONESUR NEGATIVE 09/19/2021 1403   PROTEINUR NEGATIVE 09/19/2021 1403   NITRITE NEGATIVE 09/19/2021 1403   LEUKOCYTESUR NEGATIVE 09/19/2021 1403    Last A1C:  Lab Results  Component Value Date   HGBA1C 5.5 08/31/2021     Radiologic Exams on Admission:   CT ABDOMEN PELVIS W CONTRAST Result Date: 10/18/2024 CLINICAL DATA:  Abdominal pain. Recent constipation with blood noted within the stool. EXAM: CT ABDOMEN AND PELVIS WITH CONTRAST TECHNIQUE: Multidetector CT imaging of the abdomen and pelvis was performed using the standard protocol following bolus administration of intravenous contrast. RADIATION DOSE REDUCTION: This exam was performed according to the departmental dose-optimization program which includes automated exposure control, adjustment of the mA and/or kV according to patient size and/or use  of iterative reconstruction technique. CONTRAST:  75mL OMNIPAQUE  IOHEXOL  350 MG/ML SOLN COMPARISON:  None Available. FINDINGS: Lower chest: Bibasilar linear atelectasis/scarring. Hepatobiliary: Tiny subcentimeter focal hypodensity at the right hepatic dome is too small to definitively characterize. Gallbladder is unremarkable. No biliary dilatation. Pancreas: Unremarkable. No pancreatic ductal dilatation or surrounding inflammatory changes. Spleen: Normal in size without focal abnormality. Adrenals/Urinary Tract: Adrenal glands are unremarkable. No suspicious focal lesion. No renal calculi or hydronephrosis. Bladder is unremarkable. Stomach/Bowel: Small-to-moderate hiatal hernia. No obstruction. Appendix appears normal. The descending and sigmoid colon are collapsed, which limits detailed evaluation. Within these limitations there is wall thickening, submucosal edema, and surrounding pericolonic haziness involving the descending colon and proximal to mid sigmoid colon, favored to reflect a nonspecific colitis. Sigmoid colonic diverticulosis with mildly inflamed appearance of a mid sigmoid colonic diverticulum, which may be reactive to colitis. No evidence of perforation or abscess. Vascular/Lymphatic: Nonaneurysmal abdominal aorta with extensive atherosclerosis. No enlarged abdominopelvic lymph nodes. Reproductive: Suspected calcified uterine fibroid. Bilateral adnexa are unremarkable. Other: No abdominopelvic ascites.  No intraperitoneal free air. Musculoskeletal: No acute osseous abnormality. No suspicious osseous lesion. Diffuse osseous demineralization. Degenerative changes of the lumbar spine. IMPRESSION: 1. Wall thickening and submucosal edema of the descending and proximal to mid sigmoid colon with surrounding pericolonic haziness, favored to reflect a nonspecific colitis. Sigmoid colonic diverticulosis with mildly inflamed appearance of a mid sigmoid colonic diverticulum, which may be reactive to colitis. No  evidence of perforation or abscess. 2. Small-to-moderate hiatal hernia. 3. Aortic atherosclerosis. Electronically Signed   By: Harrietta Sherry M.D.   On: 10/18/2024 11:05    EKG:   Independently reviewed.  Orders placed or performed during the hospital encounter of 10/18/24   EKG 12-Lead   --------------------------------------------------------------------------------------------------------------------------------------   Assessment / Plan:   Principal Problem:   Colitis Active Problems:   Coronary artery disease   Hyponatremia   Hypokalemia   Anxiety   Hyperlipidemia   Essential hypertension   Transaminitis   S/P CABG x 4   Thoracic aortic aneurysm (TAA)   Assessment and Plan: * Colitis Abdominal pain, associated with colitis post constipation WBC 14.3, Hura 15.1, Hemoccult positive,  CT abdomen pelvis:  Wall thickening and submucosal edema of the descending and proximal to mid sigmoid colon with surrounding pericolonic haziness, favored to reflect a nonspecific colitis. Sigmoid colonic diverticulosis with mildly  inflamed appearance of a mid sigmoid colonic diverticulum, which may be reactive to colitis. No evidence of perforation or abscess.  - WBC of 14.3, - Hemoccult positive -Patient started on Rocephin  and Flagyl  will continue for now  If any further diarrhea will screen for C. Difficile (low risk no recent use of antibiotics or sick contact)  Hypokalemia Serum potassium 3.3, replating orally, checking magnesium  level  Hyponatremia Serum sodium 121 -No focal neurological findings -Likely associated with increased free water consumption -Pursuing SIADH workup -Gentle IV fluid hydration with normal saline -Sodium supplements  Coronary artery disease History of coronary artery disease, CABG x 4 in 2022 -Denies any chest pain -Currently stable, continue home medication -Intolerance to statins (due to autoimmune disease per patient) - Aspirin  due to  bloody bowel movements  Transaminitis Mild with AST of 77, ALT 123 -Hepatitis panel -Denies ever having any fever or chills -Avoiding hepatotoxic, monitoring LFTs closely  Essential hypertension - Stable -Will continue metoprolol , Norvasc   Hyperlipidemia Continue Zetia  -Per patient intolerance to statins (due to underlying autoimmune disease)  Anxiety - Currently anxious, will monitor closely -As needed benzodiazepine  Thoracic aortic aneurysm (TAA) Last CT October 2025, thoracic aortic aneurysm 4.8 -Biannual CT monitoring recommended  S/P CABG x 4 Stable continue home medication with exception of aspirin  on hold for lower GI bleed,    -------------------------------------------------------------------------------------------------------------------------------------- DVT prophylaxis:  Place TED hose Start: 10/18/24 1125 SCDs Start: 10/18/24 1124   Code Status:   Code Status: Full Code   Admission status: Patient will be admitted as Observation, with a greater than 2 midnight length of stay. Level of care: Med-Surg   Family Communication:  none at bedside  (The above findings and plan of care has been discussed with patient in detail, the patient expressed understanding and agreement of above plan)  --------------------------------------------------------------------------------------------------------------------------------------------------  Disposition Plan:  Anticipated 1-2 days Status is: Observation The patient remains OBS appropriate and will d/c before 2 midnights.   -------------------------------------------------------------------------------------------------------------------------------------  Time spent:  19  Min.  Was spent seeing and evaluating the patient, reviewing all medical records, drawn plan of care.  SIGNED: Adriana DELENA Grams, MD, FHM. FAAFP. [facility], Pager  (Please use [email or web address] to page/ or secure chat through  epic) If 7PM-7AM, please contact night-coverage [email or web address],  10/18/2024, 12:31 PM     [1]  Allergies Allergen Reactions   Quinolones Other (See Comments)    Ascending thoracic aortic aneurysm    Budesonide Rash   "

## 2024-10-18 NOTE — Assessment & Plan Note (Addendum)
 Continue Zetia  -Per patient intolerance to statins (due to underlying autoimmune disease)

## 2024-10-18 NOTE — Assessment & Plan Note (Signed)
-   Currently anxious, will monitor closely -As needed benzodiazepine

## 2024-10-18 NOTE — ED Provider Notes (Signed)
 " [facility] Provider Note   CSN: [id number] Arrival date & time: 10/18/24  9092     Patient presents with: Rectal Bleeding   [name] [name] is a 79 y.o. female.   79 yo F with a cc of abdominal pain and bloody stool.  She tells me that she was constipated yesterday.  Tells that she never gets constipated.  She decided to go to the park and she walked around until she felt like she needed to move her bowels.  Tried multiple times to move her bowels at the park was unsuccessful.  Developed sudden severe lower abdominal discomfort.  She went home and eventually started having bowel movements.  Describes them as very loose.  Later in the evening they turned bright red.  She did not notice any more stool involved in it.  She feels like the lower abdominal pain has improved significantly.  Describes it now as just an ache.  She denies fevers.   Rectal Bleeding      Prior to Admission medications  Medication Sig Start Date End Date Taking? Authorizing Provider  amLODipine  (NORVASC ) 5 MG tablet Take 5 mg by mouth every morning. 04/21/23  Yes [provider]  aspirin  EC 81 MG tablet Take 1 tablet (81 mg total) by mouth daily. Swallow whole. 11/25/21  Yes Hobart Powell BRAVO, MD  ezetimibe  (ZETIA ) 10 MG tablet Take 1 tablet (10 mg total) by mouth daily. 09/13/21  Yes Barrett, Erin R, PA-C  metoprolol  succinate (TOPROL -XL) 50 MG 24 hr tablet Take 50 mg by mouth every morning. Take with or immediately following a meal.   Yes [provider]  pantoprazole  (PROTONIX ) 20 MG tablet Take 20 mg by mouth every evening. 03/07/23  Yes [provider]  gabapentin  (NEURONTIN ) 100 MG capsule TAKE 1 CAPSULE BY MOUTH EVERY MORNING AND 1 CAPSULE DAILY WITH SUPPER AND 2 CAPSULES AT BEDTIME. Patient not taking: Reported on 10/18/2024 07/25/24   Vear Charlie LABOR, MD    Allergies: Quinolones and Budesonide    Review of Systems   Gastrointestinal:  Positive for hematochezia.    Updated Vital Signs BP (!) 164/56   Pulse 87   Temp 97.8 F (36.6 C)   Resp (!) 25   Ht 5' 2 (1.575 m)   Wt 61.7 kg   LMP  (LMP Unknown)   SpO2 99%   BMI 24.87 kg/m   Physical Exam Vitals and nursing note reviewed.  Constitutional:      General: She is not in acute distress.    Appearance: She is well-developed. She is not diaphoretic.  HENT:     Head: Normocephalic and atraumatic.  Eyes:     Pupils: Pupils are equal, round, and reactive to light.  Cardiovascular:     Rate and Rhythm: Normal rate and regular rhythm.     Heart sounds: No murmur heard.    No friction rub. No gallop.  Pulmonary:     Effort: Pulmonary effort is normal.     Breath sounds: No wheezing or rales.  Abdominal:     General: There is no distension.     Palpations: Abdomen is soft.     Tenderness: There is no abdominal tenderness.     Comments: No obvious focal abdominal pain on exam  Musculoskeletal:        General: No tenderness.     Cervical back: Normal range of motion and neck supple.  Skin:    General:  Skin is warm and dry.  Neurological:     Mental Status: She is alert and oriented to person, place, and time.  Psychiatric:        Behavior: Behavior normal.     (all labs ordered are listed, but only abnormal results are displayed) Labs Reviewed  COMPREHENSIVE METABOLIC PANEL WITH GFR - Abnormal; Notable for the following components:      Result Value   Sodium 121 (*)    Potassium 3.3 (*)    Chloride 86 (*)    CO2 20 (*)    Glucose, Bld 135 (*)    AST 77 (*)    ALT 123 (*)    All other components within normal limits  CBC - Abnormal; Notable for the following components:   WBC 14.3 (*)    RBC 5.18 (*)    Hemoglobin 15.1 (*)    All other components within normal limits  POC OCCULT BLOOD, ED - Abnormal; Notable for the following components:   Fecal Occult Bld POSITIVE (*)    All other components within normal limits   EXPECTORATED SPUTUM ASSESSMENT W GRAM STAIN, RFLX TO RESP C  C DIFFICILE QUICK SCREEN W PCR REFLEX    MAGNESIUM   PHOSPHORUS  OSMOLALITY, URINE  OSMOLALITY  NA AND K (SODIUM & POTASSIUM), RAND UR  LACTIC ACID, PLASMA  HEMOGLOBIN AND HEMATOCRIT, BLOOD  HEMOGLOBIN AND HEMATOCRIT, BLOOD  HEMOGLOBIN AND HEMATOCRIT, BLOOD  TYPE AND SCREEN    EKG: None  Radiology: CT ABDOMEN PELVIS W CONTRAST Result Date: 10/18/2024 CLINICAL DATA:  Abdominal pain. Recent constipation with blood noted within the stool. EXAM: CT ABDOMEN AND PELVIS WITH CONTRAST TECHNIQUE: Multidetector CT imaging of the abdomen and pelvis was performed using the standard protocol following bolus administration of intravenous contrast. RADIATION DOSE REDUCTION: This exam was performed according to the departmental dose-optimization program which includes automated exposure control, adjustment of the mA and/or kV according to patient size and/or use of iterative reconstruction technique. CONTRAST:  75mL OMNIPAQUE  IOHEXOL  350 MG/ML SOLN COMPARISON:  None Available. FINDINGS: Lower chest: Bibasilar linear atelectasis/scarring. Hepatobiliary: Tiny subcentimeter focal hypodensity at the right hepatic dome is too small to definitively characterize. Gallbladder is unremarkable. No biliary dilatation. Pancreas: Unremarkable. No pancreatic ductal dilatation or surrounding inflammatory changes. Spleen: Normal in size without focal abnormality. Adrenals/Urinary Tract: Adrenal glands are unremarkable. No suspicious focal lesion. No renal calculi or hydronephrosis. Bladder is unremarkable. Stomach/Bowel: Small-to-moderate hiatal hernia. No obstruction. Appendix appears normal. The descending and sigmoid colon are collapsed, which limits detailed evaluation. Within these limitations there is wall thickening, submucosal edema, and surrounding pericolonic haziness involving the descending colon and proximal to mid sigmoid colon, favored to reflect a  nonspecific colitis. Sigmoid colonic diverticulosis with mildly inflamed appearance of a mid sigmoid colonic diverticulum, which may be reactive to colitis. No evidence of perforation or abscess. Vascular/Lymphatic: Nonaneurysmal abdominal aorta with extensive atherosclerosis. No enlarged abdominopelvic lymph nodes. Reproductive: Suspected calcified uterine fibroid. Bilateral adnexa are unremarkable. Other: No abdominopelvic ascites.  No intraperitoneal free air. Musculoskeletal: No acute osseous abnormality. No suspicious osseous lesion. Diffuse osseous demineralization. Degenerative changes of the lumbar spine. IMPRESSION: 1. Wall thickening and submucosal edema of the descending and proximal to mid sigmoid colon with surrounding pericolonic haziness, favored to reflect a nonspecific colitis. Sigmoid colonic diverticulosis with mildly inflamed appearance of a mid sigmoid colonic diverticulum, which may be reactive to colitis. No evidence of perforation or abscess. 2. Small-to-moderate hiatal hernia. 3. Aortic atherosclerosis. Electronically Signed   By:  Harrietta Sherry M.D.   On: 10/18/2024 11:05     .Critical Care  Performed by: Emil Share, DO Authorized by: Emil Share, DO   Critical care provider statement:    Critical care time (minutes):  35   Critical care time was exclusive of:  Separately billable procedures and treating other patients   Critical care was time spent personally by me on the following activities:  Development of treatment plan with patient or surrogate, discussions with consultants, evaluation of patient's response to treatment, examination of patient, ordering and review of laboratory studies, ordering and review of radiographic studies, ordering and performing treatments and interventions, pulse oximetry, re-evaluation of patient's condition and review of old charts   Care discussed with: admitting provider      Medications Ordered in the ED  sodium chloride  flush (NS) 0.9  % injection 3 mL (3 mLs Intravenous Given 10/18/24 1130)  0.9 %  sodium chloride  infusion (has no administration in time range)  acetaminophen  (TYLENOL ) tablet 650 mg (has no administration in time range)    Or  acetaminophen  (TYLENOL ) suppository 650 mg (has no administration in time range)  oxyCODONE  (Oxy IR/ROXICODONE ) immediate release tablet 5 mg (has no administration in time range)  HYDROmorphone  (DILAUDID ) injection 0.5-1 mg (has no administration in time range)  traZODone  (DESYREL ) tablet 25 mg (has no administration in time range)  senna-docusate (Senokot-S) tablet 1 tablet (has no administration in time range)  bisacodyl  (DULCOLAX) EC tablet 5 mg (has no administration in time range)  ondansetron  (ZOFRAN ) tablet 4 mg (has no administration in time range)    Or  ondansetron  (ZOFRAN ) injection 4 mg (has no administration in time range)  ipratropium (ATROVENT ) nebulizer solution 0.5 mg (has no administration in time range)  hydrALAZINE  (APRESOLINE ) injection 10 mg (has no administration in time range)  potassium chloride  SA (KLOR-CON  M) CR tablet 40 mEq (has no administration in time range)  sodium chloride  tablet 1 g (has no administration in time range)  lactobacillus (FLORANEX/LACTINEX) granules 1 g (has no administration in time range)  metroNIDAZOLE  (FLAGYL ) IVPB 500 mg (has no administration in time range)  amLODipine  (NORVASC ) tablet 5 mg (has no administration in time range)  metoprolol  succinate (TOPROL -XL) 24 hr tablet 50 mg (has no administration in time range)  ezetimibe  (ZETIA ) tablet 10 mg (has no administration in time range)  morphine  (PF) 2 MG/ML injection 2 mg (2 mg Intravenous Given 10/18/24 0945)  ondansetron  (ZOFRAN ) injection 4 mg (4 mg Intravenous Given 10/18/24 0944)  sodium chloride  0.9 % bolus 1,000 mL (0 mLs Intravenous Stopped 10/18/24 1120)  iohexol  (OMNIPAQUE ) 350 MG/ML injection 75 mL (75 mLs Intravenous Contrast Given 10/18/24 1017)  cefTRIAXone  (ROCEPHIN )  2 g in sodium chloride  0.9 % 100 mL IVPB (0 g Intravenous Stopped 10/18/24 1151)  metroNIDAZOLE  (FLAGYL ) IVPB 500 mg (500 mg Intravenous New Bag/Given 10/18/24 1121)                                    Medical Decision Making Amount and/or Complexity of Data Reviewed Labs: ordered. Radiology: ordered.  Risk Prescription drug management. Decision regarding hospitalization.   79 yo F with a chief complaints of abdominal pain and diarrhea.  Going on since yesterday afternoon.  Said it started as stool and now is all bright red blood.  Rectal exam is grossly bloody.  Benign abdominal exam for me.  Will treat symptoms here.  Blood  work.  She is on baby aspirin .  Hemoglobin stable.  Sodium will be 121.  Discussed with the patient at home she has been drinking a lot of free water to try helping with her constipation.  CT imaging of the abdomen pelvis with perhaps colitis.  Will start on antibiotics.  Will discuss with medicine for admission.  The patients results and plan were reviewed and discussed.   Any x-rays performed were independently reviewed by myself.   Differential diagnosis were considered with the presenting HPI.  Medications  sodium chloride  flush (NS) 0.9 % injection 3 mL (3 mLs Intravenous Given 10/18/24 1130)  0.9 %  sodium chloride  infusion (has no administration in time range)  acetaminophen  (TYLENOL ) tablet 650 mg (has no administration in time range)    Or  acetaminophen  (TYLENOL ) suppository 650 mg (has no administration in time range)  oxyCODONE  (Oxy IR/ROXICODONE ) immediate release tablet 5 mg (has no administration in time range)  HYDROmorphone  (DILAUDID ) injection 0.5-1 mg (has no administration in time range)  traZODone  (DESYREL ) tablet 25 mg (has no administration in time range)  senna-docusate (Senokot-S) tablet 1 tablet (has no administration in time range)  bisacodyl  (DULCOLAX) EC tablet 5 mg (has no administration in time range)  ondansetron  (ZOFRAN )  tablet 4 mg (has no administration in time range)    Or  ondansetron  (ZOFRAN ) injection 4 mg (has no administration in time range)  ipratropium (ATROVENT ) nebulizer solution 0.5 mg (has no administration in time range)  hydrALAZINE  (APRESOLINE ) injection 10 mg (has no administration in time range)  potassium chloride  SA (KLOR-CON  M) CR tablet 40 mEq (has no administration in time range)  sodium chloride  tablet 1 g (has no administration in time range)  lactobacillus (FLORANEX/LACTINEX) granules 1 g (has no administration in time range)  metroNIDAZOLE  (FLAGYL ) IVPB 500 mg (has no administration in time range)  amLODipine  (NORVASC ) tablet 5 mg (has no administration in time range)  metoprolol  succinate (TOPROL -XL) 24 hr tablet 50 mg (has no administration in time range)  ezetimibe  (ZETIA ) tablet 10 mg (has no administration in time range)  morphine  (PF) 2 MG/ML injection 2 mg (2 mg Intravenous Given 10/18/24 0945)  ondansetron  (ZOFRAN ) injection 4 mg (4 mg Intravenous Given 10/18/24 0944)  sodium chloride  0.9 % bolus 1,000 mL (0 mLs Intravenous Stopped 10/18/24 1120)  iohexol  (OMNIPAQUE ) 350 MG/ML injection 75 mL (75 mLs Intravenous Contrast Given 10/18/24 1017)  cefTRIAXone  (ROCEPHIN ) 2 g in sodium chloride  0.9 % 100 mL IVPB (0 g Intravenous Stopped 10/18/24 1151)  metroNIDAZOLE  (FLAGYL ) IVPB 500 mg (500 mg Intravenous New Bag/Given 10/18/24 1121)    Vitals:   10/18/24 0945 10/18/24 1000 10/18/24 1120 10/18/24 1258  BP: (!) 161/87 (!) 158/79 (!) 164/56   Pulse: 83 84 87   Resp: (!) 22 (!) 24 (!) 25   Temp:    97.8 F (36.6 C)  TempSrc:      SpO2: 100% 100% 99%   Weight:      Height:        Final diagnoses:  Colitis  Hyponatremia    Admission/ observation were discussed with the admitting physician, patient and/or family and they are comfortable with the plan.       Final diagnoses:  Colitis  Hyponatremia    ED Discharge Orders     None          Emil Share,  DO 10/18/24 1341  "

## 2024-10-19 DIAGNOSIS — I10 Essential (primary) hypertension: Secondary | ICD-10-CM | POA: Diagnosis not present

## 2024-10-19 DIAGNOSIS — E871 Hypo-osmolality and hyponatremia: Secondary | ICD-10-CM | POA: Diagnosis not present

## 2024-10-19 DIAGNOSIS — R7401 Elevation of levels of liver transaminase levels: Secondary | ICD-10-CM

## 2024-10-19 DIAGNOSIS — K529 Noninfective gastroenteritis and colitis, unspecified: Secondary | ICD-10-CM | POA: Diagnosis not present

## 2024-10-19 DIAGNOSIS — F419 Anxiety disorder, unspecified: Secondary | ICD-10-CM | POA: Diagnosis not present

## 2024-10-19 DIAGNOSIS — K625 Hemorrhage of anus and rectum: Secondary | ICD-10-CM | POA: Diagnosis not present

## 2024-10-19 DIAGNOSIS — E785 Hyperlipidemia, unspecified: Secondary | ICD-10-CM | POA: Diagnosis not present

## 2024-10-19 LAB — COMPREHENSIVE METABOLIC PANEL WITH GFR
ALT: 100 U/L — ABNORMAL HIGH (ref 0–44)
AST: 65 U/L — ABNORMAL HIGH (ref 15–41)
Albumin: 3.4 g/dL — ABNORMAL LOW (ref 3.5–5.0)
Alkaline Phosphatase: 61 U/L (ref 38–126)
Anion gap: 8 (ref 5–15)
BUN: 18 mg/dL (ref 8–23)
CO2: 22 mmol/L (ref 22–32)
Calcium: 8.3 mg/dL — ABNORMAL LOW (ref 8.9–10.3)
Chloride: 108 mmol/L (ref 98–111)
Creatinine, Ser: 0.88 mg/dL (ref 0.44–1.00)
GFR, Estimated: >60 mL/min
Glucose, Bld: 87 mg/dL (ref 70–99)
Potassium: 4.2 mmol/L (ref 3.5–5.1)
Sodium: 137 mmol/L (ref 135–145)
Total Bilirubin: 0.4 mg/dL (ref 0.0–1.2)
Total Protein: 6.3 g/dL — ABNORMAL LOW (ref 6.5–8.1)

## 2024-10-19 LAB — CBC
HCT: 39.5 % (ref 36.0–46.0)
Hemoglobin: 13.6 g/dL (ref 12.0–15.0)
MCH: 29.2 pg (ref 26.0–34.0)
MCHC: 34.4 g/dL (ref 30.0–36.0)
MCV: 84.9 fL (ref 80.0–100.0)
Platelets: 188 K/uL (ref 150–400)
RBC: 4.65 MIL/uL (ref 3.87–5.11)
RDW: 13.9 % (ref 11.5–15.5)
WBC: 6.7 K/uL (ref 4.0–10.5)
nRBC: 0 % (ref 0.0–0.2)

## 2024-10-19 LAB — GLUCOSE, CAPILLARY: Glucose-Capillary: 83 mg/dL (ref 70–99)

## 2024-10-19 LAB — HEMOGLOBIN AND HEMATOCRIT, BLOOD
HCT: 39.6 % (ref 36.0–46.0)
HCT: 41.4 % (ref 36.0–46.0)
HCT: 43.8 % (ref 36.0–46.0)
Hemoglobin: 13.6 g/dL (ref 12.0–15.0)
Hemoglobin: 13.9 g/dL (ref 12.0–15.0)
Hemoglobin: 14.7 g/dL (ref 12.0–15.0)

## 2024-10-19 MED ORDER — FLORANEX PO PACK: 1.0000 g | PACK | Freq: Three times a day (TID) | ORAL | 0 refills | Status: DC

## 2024-10-19 MED ORDER — AMOXICILLIN-POT CLAVULANATE 875-125 MG PO TABS: 1.0000 | ORAL_TABLET | Freq: Two times a day (BID) | ORAL | 0 refills | Status: AC

## 2024-10-19 NOTE — Plan of Care (Addendum)
 Patient calm and cooperative A&O X4, medications tolerated well. Patient ambulated to bathroom well. Patient left with respirations 16 and bed in lowest position.  Problem: Education: Goal: Knowledge of General Education information will improve Description: Including pain rating scale, medication(s)/side effects and non-pharmacologic comfort measures Outcome: Progressing   Problem: Clinical Measurements: Goal: Ability to maintain clinical measurements within normal limits will improve Outcome: Progressing   Problem: Activity: Goal: Risk for activity intolerance will decrease Outcome: Progressing   Problem: Nutrition: Goal: Adequate nutrition will be maintained Outcome: Progressing   Problem: Coping: Goal: Level of anxiety will decrease Outcome: Progressing   Problem: Pain Managment: Goal: General experience of comfort will improve and/or be controlled Outcome: Progressing   Problem: Safety: Goal: Ability to remain free from injury will improve Outcome: Progressing

## 2024-10-19 NOTE — TOC CM/SW Note (Addendum)
 Transition of Care [facility]) - Inpatient Brief Assessment   Patient Details  Name: [name] [name] MRN: [id number] Date of Birth: 1946/05/09  [facility] CM/SW Contact:    Roxie KANDICE Stain, RN Phone Number: 10/19/2024, 12:45 PM   Clinical Narrative:  Patient admitted for Rectal bleed.  GI consult.  ICM (Inpatient Care Management) will continue to follow, no needs anticipated at this time.   Transition of Care Asessment: Insurance and Status: Insurance coverage has been reviewed Patient has primary care physician: Yes Home environment has been reviewed: safe to discharge home Prior level of function:: Independent Prior/Current Home Services: No current home services Social Drivers of Health Review: SDOH reviewed no interventions necessary Readmission risk has been reviewed: Yes Transition of care needs: no transition of care needs at this time

## 2024-10-19 NOTE — Progress Notes (Signed)
 "    Daily Progress Note  DOA: 10/18/2024 Hospital Day: 2  Cc: Acute abdominal pain /blood in stool /abnormal colon on CT scan   Brief Summary:    Assessment   #79 yo female with acute lower abdominal pain, bowel changes with blood in stool and CT scan showing wall thickening and submucosal edema of the descending and proximal to mid sigmoid colon.     Given acute onset suspect infectious, but ischemic colitis not excluded given presentation (though distribution of inflammation on scan atypical).  Patient feels fine today.  She ate all of her breakfast.  She has not had a bowel movement/passed any blood since arriving to the hospital yesterday morning.  No significant abdominal pain nor nausea/vomiting.  She would like to go home and follow-up with us  in the office   # History of autoimmune hepatitis (off treatment) Off azathioprine  for the last several years with normal liver enzymes.  Now with mildly elevated liver enzymes in the in setting of acute illness.   Liver enzymes improved overnight.    # GERD Hiatal hernia Takes low dose pantoprazole  at home   *During initial consultation I learned that patient was followed by [facility] a few years ago. She adamantly refuses to be seen by anyone in the practice again. Since this consult was already done we will assume her care as outpatient  Plan: -- No stool output since admission - stool studies not collected -- Recommend total of 5 days of Cipro and Flagyl  -- Low fiber diet x 1 week upon discharge -- Our office will reach out to patient to give her a hospital follow-up appointment -- We we can repeat liver chemistries at office f/u.   If remain abnormal then may need to get reestablished with Dr. Wyatt at [facility] -- Will need a colonoscopy sometime in the next several weeks  - GI will sign off.  Please call for questions  Principal Problem:   Colitis Active Problems:   S/P CABG x 4   Coronary artery disease   Anxiety    Hyperlipidemia   Essential hypertension   Thoracic aortic aneurysm (TAA)   Hyponatremia   Hypokalemia   Transaminitis   Rectal bleeding      Recent Labs    10/18/24 0920 10/18/24 1234 10/18/24 1835 10/19/24 0001 10/19/24 0624  WBC 14.3*  --   --   --  6.7  HGB 15.1*   < > 14.7 13.6 13.6  HCT 42.8   < > 41.9 39.6 39.5  MCV 82.6  --   --   --  84.9  PLT 258  --   --   --  188   < > = values in this interval not displayed.   No results for input(s): FOLATE, VITAMINB12, FERRITIN, TIBC, IRONPCTSAT in the last 72 hours. Recent Labs    10/18/24 0920 10/19/24 0624  NA 121* 137  K 3.3* 4.2  CL 86* 108  CO2 20* 22  GLUCOSE 135* 87  BUN 12 18  CREATININE 0.74 0.88  CALCIUM 9.0 8.3*   Recent Labs    10/18/24 0920 10/19/24 0624  PROT 8.0 6.3*  ALBUMIN  4.2 3.4*  AST 77* 65*  ALT 123* 100*  ALKPHOS 70 61  BILITOT 0.6 0.4      Imaging:  CT ABDOMEN PELVIS W CONTRAST CLINICAL DATA:  Abdominal pain. Recent constipation with blood noted within the stool.  EXAM: CT ABDOMEN AND PELVIS WITH CONTRAST  TECHNIQUE: Multidetector CT  imaging of the abdomen and pelvis was performed using the standard protocol following bolus administration of intravenous contrast.  RADIATION DOSE REDUCTION: This exam was performed according to the departmental dose-optimization program which includes automated exposure control, adjustment of the mA and/or kV according to patient size and/or use of iterative reconstruction technique.  CONTRAST:  75mL OMNIPAQUE  IOHEXOL  350 MG/ML SOLN  COMPARISON:  None Available.  FINDINGS: Lower chest: Bibasilar linear atelectasis/scarring.  Hepatobiliary: Tiny subcentimeter focal hypodensity at the right hepatic dome is too small to definitively characterize. Gallbladder is unremarkable. No biliary dilatation.  Pancreas: Unremarkable. No pancreatic ductal dilatation or surrounding inflammatory changes.  Spleen: Normal in size without  focal abnormality.  Adrenals/Urinary Tract: Adrenal glands are unremarkable. No suspicious focal lesion. No renal calculi or hydronephrosis. Bladder is unremarkable.  Stomach/Bowel: Small-to-moderate hiatal hernia. No obstruction. Appendix appears normal. The descending and sigmoid colon are collapsed, which limits detailed evaluation. Within these limitations there is wall thickening, submucosal edema, and surrounding pericolonic haziness involving the descending colon and proximal to mid sigmoid colon, favored to reflect a nonspecific colitis. Sigmoid colonic diverticulosis with mildly inflamed appearance of a mid sigmoid colonic diverticulum, which may be reactive to colitis. No evidence of perforation or abscess.  Vascular/Lymphatic: Nonaneurysmal abdominal aorta with extensive atherosclerosis. No enlarged abdominopelvic lymph nodes.  Reproductive: Suspected calcified uterine fibroid. Bilateral adnexa are unremarkable.  Other: No abdominopelvic ascites.  No intraperitoneal free air.  Musculoskeletal: No acute osseous abnormality. No suspicious osseous lesion. Diffuse osseous demineralization. Degenerative changes of the lumbar spine.  IMPRESSION: 1. Wall thickening and submucosal edema of the descending and proximal to mid sigmoid colon with surrounding pericolonic haziness, favored to reflect a nonspecific colitis. Sigmoid colonic diverticulosis with mildly inflamed appearance of a mid sigmoid colonic diverticulum, which may be reactive to colitis. No evidence of perforation or abscess. 2. Small-to-moderate hiatal hernia. 3. Aortic atherosclerosis.  Electronically Signed   By: Harrietta Sherry M.D.   On: 10/18/2024 11:05     Scheduled inpatient medications:   amLODipine   5 mg Oral Daily   ezetimibe   10 mg Oral Daily   lactobacillus  1 g Oral TID WC   metoprolol  succinate  50 mg Oral Daily   sodium chloride  flush  3 mL Intravenous Q12H   sodium chloride   1 g Oral  BID WC   Continuous inpatient infusions:   sodium chloride  100 mL/hr at 10/18/24 1459   cefTRIAXone  (ROCEPHIN )  IV     metronidazole  500 mg (10/19/24 0930)   PRN inpatient medications: acetaminophen  **OR** acetaminophen , bisacodyl , hydrALAZINE , HYDROmorphone  (DILAUDID ) injection, ipratropium, ondansetron  **OR** ondansetron  (ZOFRAN ) IV, oxyCODONE , senna-docusate, traZODone   Vital signs in last 24 hours: Temp:  [97.6 F (36.4 C)-98.5 F (36.9 C)] 97.6 F (36.4 C) (01/22 0702) Pulse Rate:  [63-87] 65 (01/22 0702) Resp:  [16-25] 16 (01/22 0702) BP: (115-164)/(56-79) 123/70 (01/22 0702) SpO2:  [94 %-99 %] 96 % (01/22 0702) Last BM Date : 10/17/24  Intake/Output Summary (Last 24 hours) at 10/19/2024 1025 Last data filed at 10/18/2024 1700 Gross per 24 hour  Intake 194.68 ml  Output --  Net 194.68 ml    Intake/Output from previous day: 01/21 0701 - 01/22 0700 In: 194.7 [I.V.:194.7] Out: -  Intake/Output this shift: No intake/output data recorded.   Physical Exam:  General: Alert female in NAD Heart:  Regular rate and rhythm.  Pulmonary: Normal respiratory effort Abdomen: Soft, nondistended, nontender. Normal bowel sounds. Extremities: No lower extremity edema  Neurologic: Alert and oriented Psych: Pleasant. Cooperative  LOS: 0 days   Vina Dasen ,NP 10/19/2024, 10:25 AM       "

## 2024-10-19 NOTE — Plan of Care (Signed)

## 2024-10-19 NOTE — Progress Notes (Signed)
 "        [name] [name]                                                                               [name] [name], is a 79 y.o. female, DOB - [birth year], FMW:[id number] Admit date - 10/18/2024    Outpatient Primary MD for the patient is Cleotilde Planas, MD  LOS - 0  days    Brief summary   [name] [name] is a 79 year old female CAD, CABG x 4, HTN/ HLD, AAA 4.8 cm, GERD, who has been experiencing abdominal pain, constipation since yesterday,  which after ingestion of lots of water finally had a bowel movement then started noticing bright red blood per rectum.  Patient reports she has had few loose stools and has been bloody approximately 4 loose stool since yesterday. Reports pain is improving in lower abdominal area.  Is not associate with any nausea or vomiting.    Assessment & Plan    Assessment and Plan: * Colitis Abdominal pain, associated with colitis post constipation WBC 14.3, Hura 15.1, Hemoccult positive,  CT abdomen pelvis:  Wall thickening and submucosal edema of the descending and proximal to mid sigmoid colon with surrounding pericolonic haziness, favored to reflect a nonspecific colitis. Sigmoid colonic diverticulosis with mildly inflamed appearance of a mid sigmoid colonic diverticulum, which may be reactive to colitis. No evidence of perforation or abscess.  - WBC of 14.3, - Hemoccult positive -Patient started on Rocephin  and Flagyl  will continue for now  - no diarrhea so far.  Wbc count normalized.  GI consulted and recommendations given.  She had another BM with blood.  Transition her to clears for tonight, moniter overnight on IV fluids and IV antibiotics.   Hypokalemia Replaced   Hyponatremia Resolved.   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    Transaminitis/  Mild with AST of 77, ALT 123 -Hepatitis panel -Denies ever having any fever or chills -Avoiding hepatotoxic, monitoring LFTs closely - recommend outpatient work up for auto immune etiology.   Essential hypertension Well controlled.   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   Weight as of this encounter: 61.7 kg.  Code Status: full code.  DVT Prophylaxis:  Place TED hose Start: 10/18/24 1125 SCDs Start: 10/18/24 1124   Level of Care: Level of care: Med-Surg Family Communication: none at bedside.   Disposition Plan:     Remains inpatient appropriate:  pending.    Procedures:  None.   Consultants:   GI  Antimicrobials:   Anti-infectives (From admission, onward)  Start     Dose/Rate Route Frequency Ordered Stop   10/19/24 0000  amoxicillin -clavulanate (AUGMENTIN ) 875-125 MG tablet        1 tablet Oral 2 times daily 10/19/24 1423 10/26/24 2359   10/18/24 2200  metroNIDAZOLE  (FLAGYL ) IVPB 500 mg        500 mg 100 mL/hr over 60 Minutes Intravenous Every 12 hours 10/18/24 1132     10/18/24 1115  Ampicillin-Sulbactam (UNASYN) 3 g in sodium chloride  0.9 % 100 mL IVPB  Status:  Discontinued        3 g 200 mL/hr over 30 Minutes Intravenous  Once 10/18/24 1108 10/18/24 1109   10/18/24 1115  cefTRIAXone  (ROCEPHIN ) 2 g in sodium chloride  0.9 % 100 mL IVPB        2 g 200 mL/hr over 30 Minutes Intravenous  Once 10/18/24 1109 10/18/24 1151   10/18/24 1115  metroNIDAZOLE   (FLAGYL ) IVPB 500 mg        500 mg 100 mL/hr over 60 Minutes Intravenous  Once 10/18/24 1109 10/18/24 1221   10/18/24 1100  cefTRIAXone  (ROCEPHIN ) 2 g in sodium chloride  0.9 % 100 mL IVPB        2 g 200 mL/hr over 30 Minutes Intravenous Every 24 hours 10/18/24 1818          Medications  Scheduled Meds:  amLODipine   5 mg Oral Daily   ezetimibe   10 mg Oral Daily   lactobacillus  1 g Oral TID WC   metoprolol  succinate  50 mg Oral Daily   sodium chloride  flush  3 mL Intravenous Q12H   sodium chloride   1 g Oral BID WC   Continuous Infusions:  cefTRIAXone  (ROCEPHIN )  IV 2 g (10/19/24 1033)   metronidazole  500 mg (10/19/24 0930)   PRN Meds:.acetaminophen  **OR** acetaminophen , bisacodyl , hydrALAZINE , HYDROmorphone  (DILAUDID ) injection, ipratropium, ondansetron  **OR** ondansetron  (ZOFRAN ) IV, oxyCODONE , senna-docusate, traZODone     Subjective:   [name] [name] was seen and examined today.  Pt had another bloody bowel movement later this evening.  Objective:   Vitals:   10/18/24 2013 10/19/24 0040 10/19/24 0410 10/19/24 0702  BP: 128/70 123/68 115/75 123/70  Pulse: 72 73 63 65  Resp: 18   16  Temp: 97.9 F (36.6 C)  98.2 F (36.8 C) 97.6 F (36.4 C)  TempSrc: Oral  Oral   SpO2: 97% 95% 94% 96%  Weight:      Height:       No intake or output data in the 24 hours ending 10/19/24 1737 Filed Weights   10/18/24 0912  Weight: 61.7 kg     Exam General: Alert and oriented x 3, NAD Cardiovascular: S1 S2 auscultated, no murmurs, RRR Respiratory: Clear to auscultation bilaterally, no wheezing, rales or rhonchi Gastrointestinal: Soft, nontender, nondistended, + bowel sounds Ext: no pedal edema bilaterally Neuro: AAOx3, Cr N's II- XII. Strength 5/5 upper and lower extremities bilaterally Skin: No rashes Psych: Normal affect and demeanor, alert and oriented x3    Data Reviewed:  I have personally reviewed following labs and imaging studies   CBC Lab Results  Component Value  Date   WBC 6.7 10/19/2024   RBC 4.65 10/19/2024   HGB 13.9 10/19/2024   HCT 41.4 10/19/2024   MCV 84.9 10/19/2024   MCH 29.2 10/19/2024   PLT 188 10/19/2024   MCHC 34.4 10/19/2024   RDW 13.9 10/19/2024   LYMPHSABS 1.8 07/04/2024   MONOABS 0.5 07/04/2024   EOSABS 0.1 07/04/2024   BASOSABS 0.1 07/04/2024  Last metabolic panel Lab Results  Component Value Date   NA 137 10/19/2024   K 4.2 10/19/2024   CL 108 10/19/2024   CO2 22 10/19/2024   BUN 18 10/19/2024   CREATININE 0.88 10/19/2024   GLUCOSE 87 10/19/2024   GFRNONAA >60 10/19/2024   CALCIUM 8.3 (L) 10/19/2024   PHOS 3.8 10/18/2024   PROT 6.3 (L) 10/19/2024   ALBUMIN  3.4 (L) 10/19/2024   LABGLOB 3.0 07/06/2023   AGRATIO 1.5 02/11/2023   BILITOT 0.4 10/19/2024   ALKPHOS 61 10/19/2024   AST 65 (H) 10/19/2024   ALT 100 (H) 10/19/2024   ANIONGAP 8 10/19/2024    CBG (last 3)  Recent Labs    10/19/24 0715  GLUCAP 83      Coagulation Profile: Recent Labs  Lab 10/18/24 2159  INR 1.0     Radiology Studies: CT ABDOMEN PELVIS W CONTRAST Result Date: 10/18/2024 CLINICAL DATA:  Abdominal pain. Recent constipation with blood noted within the stool. EXAM: CT ABDOMEN AND PELVIS WITH CONTRAST TECHNIQUE: Multidetector CT imaging of the abdomen and pelvis was performed using the standard protocol following bolus administration of intravenous contrast. RADIATION DOSE REDUCTION: This exam was performed according to the departmental dose-optimization program which includes automated exposure control, adjustment of the mA and/or kV according to patient size and/or use of iterative reconstruction technique. CONTRAST:  75mL OMNIPAQUE  IOHEXOL  350 MG/ML SOLN COMPARISON:  None Available. FINDINGS: Lower chest: Bibasilar linear atelectasis/scarring. Hepatobiliary: Tiny subcentimeter focal hypodensity at the right hepatic dome is too small to definitively characterize. Gallbladder is unremarkable. No biliary dilatation. Pancreas:  Unremarkable. No pancreatic ductal dilatation or surrounding inflammatory changes. Spleen: Normal in size without focal abnormality. Adrenals/Urinary Tract: Adrenal glands are unremarkable. No suspicious focal lesion. No renal calculi or hydronephrosis. Bladder is unremarkable. Stomach/Bowel: Small-to-moderate hiatal hernia. No obstruction. Appendix appears normal. The descending and sigmoid colon are collapsed, which limits detailed evaluation. Within these limitations there is wall thickening, submucosal edema, and surrounding pericolonic haziness involving the descending colon and proximal to mid sigmoid colon, favored to reflect a nonspecific colitis. Sigmoid colonic diverticulosis with mildly inflamed appearance of a mid sigmoid colonic diverticulum, which may be reactive to colitis. No evidence of perforation or abscess. Vascular/Lymphatic: Nonaneurysmal abdominal aorta with extensive atherosclerosis. No enlarged abdominopelvic lymph nodes. Reproductive: Suspected calcified uterine fibroid. Bilateral adnexa are unremarkable. Other: No abdominopelvic ascites.  No intraperitoneal free air. Musculoskeletal: No acute osseous abnormality. No suspicious osseous lesion. Diffuse osseous demineralization. Degenerative changes of the lumbar spine. IMPRESSION: 1. Wall thickening and submucosal edema of the descending and proximal to mid sigmoid colon with surrounding pericolonic haziness, favored to reflect a nonspecific colitis. Sigmoid colonic diverticulosis with mildly inflamed appearance of a mid sigmoid colonic diverticulum, which may be reactive to colitis. No evidence of perforation or abscess. 2. Small-to-moderate hiatal hernia. 3. Aortic atherosclerosis. Electronically Signed   By: Harrietta Sherry M.D.   On: 10/18/2024 11:05       Elgie Butter M.D. [name] [name] 10/19/2024, 5:37 PM  Available via Epic secure chat 7am-7pm After 7 pm, please refer to night coverage provider listed on amion.    "

## 2024-10-20 DIAGNOSIS — K529 Noninfective gastroenteritis and colitis, unspecified: Secondary | ICD-10-CM | POA: Diagnosis not present

## 2024-10-20 DIAGNOSIS — E785 Hyperlipidemia, unspecified: Secondary | ICD-10-CM | POA: Diagnosis not present

## 2024-10-20 DIAGNOSIS — I10 Essential (primary) hypertension: Secondary | ICD-10-CM

## 2024-10-20 DIAGNOSIS — K625 Hemorrhage of anus and rectum: Secondary | ICD-10-CM | POA: Diagnosis not present

## 2024-10-20 DIAGNOSIS — E871 Hypo-osmolality and hyponatremia: Secondary | ICD-10-CM

## 2024-10-20 DIAGNOSIS — F419 Anxiety disorder, unspecified: Secondary | ICD-10-CM

## 2024-10-20 LAB — BASIC METABOLIC PANEL WITH GFR
Anion gap: 11 (ref 5–15)
BUN: 17 mg/dL (ref 8–23)
CO2: 22 mmol/L (ref 22–32)
Calcium: 8.6 mg/dL — ABNORMAL LOW (ref 8.9–10.3)
Chloride: 106 mmol/L (ref 98–111)
Creatinine, Ser: 0.79 mg/dL (ref 0.44–1.00)
GFR, Estimated: >60 mL/min
Glucose, Bld: 91 mg/dL (ref 70–99)
Potassium: 3.8 mmol/L (ref 3.5–5.1)
Sodium: 138 mmol/L (ref 135–145)

## 2024-10-20 LAB — CBC
HCT: 41.7 % (ref 36.0–46.0)
Hemoglobin: 14.3 g/dL (ref 12.0–15.0)
MCH: 29.5 pg (ref 26.0–34.0)
MCHC: 34.3 g/dL (ref 30.0–36.0)
MCV: 86 fL (ref 80.0–100.0)
Platelets: 215 K/uL (ref 150–400)
RBC: 4.85 MIL/uL (ref 3.87–5.11)
RDW: 14.1 % (ref 11.5–15.5)
WBC: 8.1 K/uL (ref 4.0–10.5)
nRBC: 0 % (ref 0.0–0.2)

## 2024-10-20 MED ORDER — FLORANEX PO PACK: 1.0000 g | PACK | Freq: Three times a day (TID) | ORAL | 0 refills | Status: AC

## 2024-10-20 NOTE — Plan of Care (Signed)

## 2024-10-22 LAB — TYPE AND SCREEN
ABO/RH(D): A POS
Antibody Screen: POSITIVE
DAT, IgG: POSITIVE
PT AG Type: POSITIVE
Unit division: 0
Unit division: 0

## 2024-10-22 LAB — BPAM RBC
Blood Product Expiration Date: 202602212359
Blood Product Expiration Date: 202602212359
Unit Type and Rh: 6200
Unit Type and Rh: 6200

## 2024-10-22 NOTE — Discharge Summary (Signed)
 " Physician Discharge Summary   Patient: [name] [name] MRN: [id number] DOB: 11-Jul-1946  Admit date:     10/18/2024  Discharge date: 10/20/2024  Discharge Physician: Elgie Butter   PCP: Cleotilde Planas, MD   Recommendations at discharge:  Please follow up with PCP in one week.  Please check cbc and bmp in one week.  Please follow up with gastroenterology as recommended outpatient.   Discharge Diagnoses: Principal Problem:   Colitis Active Problems:   Coronary artery disease   Hyponatremia   Hypokalemia   Anxiety   Hyperlipidemia   Essential hypertension   Transaminitis   S/P CABG x 4   Thoracic aortic aneurysm (TAA)   Rectal bleeding  Resolved Problems:   * No resolved hospital problems. 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  Patient reports she has had few loose stools and has been bloody approximately 4 loose stool since yesterday. Reports pain is improving in lower abdominal area.  Is not associate with any nausea or vomiting.  Assessment and Plan:   Colitis Abdominal pain, associated with colitis post constipation WBC 14.3, Hura 15.1, Hemoccult positive,   CT abdomen pelvis:  Wall thickening and submucosal edema of the descending and proximal to mid sigmoid colon with surrounding pericolonic haziness, favored to reflect a nonspecific colitis. Sigmoid colonic diverticulosis with mildly inflamed appearance of a mid sigmoid colonic diverticulum, which may be reactive to colitis. No evidence of perforation or abscess.   - WBC of 14.3, - Hemoccult positive -Patient started on Rocephin  and Flagyl  will continue for now, transitioned to augmentin  to completed the course.  - no diarrhea so far.  Wbc count normalized.   GI consulted and recommendations given.       Hypokalemia Replaced     Hyponatremia Resolved.    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    Transaminitis/  Mild with AST of 77, ALT 123 -Hepatitis panel -Denies ever having any fever or chills -Avoiding hepatotoxic, monitoring LFTs closely - recommend outpatient work up for auto immune etiology.    Essential hypertension Well controlled.    Hyperlipidemia Continue Zetia  -Per patient intolerance to statins (due to underlying autoimmune disease)   Anxiety - Currently anxious, will monitor closely -As needed benzodiazepine   Thoracic aortic aneurysm (TAA) Last CT October 2025, thoracic aortic aneurysm 4.8 -Biannual CT monitoring recommended   S/P CABG x 4 Stable continue home medication with exception of aspirin  on hold for lower GI bleed,     Estimated body mass index is 24.87 kg/m as calculated from the following:   Height as of this encounter: 5' 2 (1.575 m).   Weight as of this encounter: 61.7 kg.    Consultants: gastroenterology Procedures performed: none.   Disposition: Home Diet recommendation:  Regular diet DISCHARGE MEDICATION: Allergies as of 10/20/2024       Reactions   Quinolones Other (See Comments)   Ascending thoracic aortic aneurysm    Budesonide Rash        Medication List     PAUSE taking these medications  aspirin  EC 81 MG tablet Wait to take this until your doctor or other care provider tells you to start again. Take 1 tablet (81 mg total) by mouth daily. Swallow whole.       TAKE these medications    amLODipine  5 MG tablet Commonly known as: NORVASC  Take 5 mg by mouth every morning.   amoxicillin -clavulanate 875-125 MG tablet Commonly known as: AUGMENTIN  Take 1 tablet by mouth  2 (two) times daily for 7 days.   ezetimibe  10 MG tablet Commonly known as: ZETIA  Take 1 tablet (10 mg total) by mouth daily.   gabapentin  100 MG capsule Commonly known as: NEURONTIN  TAKE 1 CAPSULE BY MOUTH EVERY MORNING AND 1 CAPSULE DAILY WITH SUPPER AND 2 CAPSULES AT BEDTIME.   lactobacillus Pack Take 1 packet (1 g total) by mouth 3 (three) times daily with meals.   metoprolol  succinate 50 MG 24 hr tablet Commonly known as: TOPROL -XL Take 50 mg by mouth every morning. Take with or immediately following a meal.   pantoprazole  20 MG tablet Commonly known as: PROTONIX  Take 20 mg by mouth every evening.        Discharge Exam: Filed Weights   10/18/24 0912  Weight: 61.7 kg   General exam: Appears calm and comfortable  Respiratory system: Clear to auscultation. Respiratory effort normal. Cardiovascular system: S1 & S2 heard, RRR. No JVD,  Gastrointestinal system: Abdomen is nondistended, soft and nontender.  Central nervous system: Alert and oriented. Extremities: Symmetric 5 x 5 power. Skin: No rashes, lesions or ulcers Psychiatry:Mood & affect appropriate.    Condition at discharge: fair  The results of significant diagnostics from this hospitalization (including imaging, microbiology, ancillary and laboratory) are listed below for reference.   Imaging Studies: CT ABDOMEN PELVIS W CONTRAST Result Date: 10/18/2024 CLINICAL DATA:  Abdominal pain. Recent constipation with blood noted within the stool. EXAM: CT ABDOMEN AND PELVIS WITH CONTRAST TECHNIQUE: Multidetector CT imaging of the abdomen and pelvis was performed using the standard protocol following bolus administration of intravenous contrast. RADIATION DOSE REDUCTION: This exam was performed according to the departmental dose-optimization program which includes automated exposure control, adjustment of the mA and/or kV according to patient size and/or use of iterative reconstruction technique. CONTRAST:  75mL  OMNIPAQUE  IOHEXOL  350 MG/ML SOLN COMPARISON:  None Available. FINDINGS: Lower chest: Bibasilar linear atelectasis/scarring. Hepatobiliary: Tiny subcentimeter focal hypodensity at the right hepatic dome is too small to definitively characterize. Gallbladder is unremarkable. No biliary dilatation. Pancreas: Unremarkable. No pancreatic ductal dilatation or surrounding inflammatory changes. Spleen: Normal in size without focal abnormality. Adrenals/Urinary Tract: Adrenal glands are unremarkable. No suspicious focal lesion. No renal calculi or hydronephrosis. Bladder is unremarkable. Stomach/Bowel: Small-to-moderate hiatal hernia. No obstruction. Appendix appears normal. The descending and sigmoid colon are collapsed, which limits detailed evaluation. Within these limitations there is wall thickening, submucosal edema, and surrounding pericolonic haziness involving the descending colon and proximal to mid sigmoid colon, favored to reflect a nonspecific colitis. Sigmoid colonic diverticulosis with mildly inflamed appearance of a mid sigmoid colonic diverticulum, which may be reactive to colitis. No evidence of perforation or abscess. Vascular/Lymphatic: Nonaneurysmal abdominal aorta with extensive atherosclerosis. No enlarged abdominopelvic lymph nodes. Reproductive: Suspected calcified uterine fibroid. Bilateral adnexa are unremarkable. Other: No abdominopelvic ascites.  No intraperitoneal free air. Musculoskeletal: No acute osseous abnormality. No suspicious osseous lesion. Diffuse osseous demineralization. Degenerative changes of the lumbar spine. IMPRESSION: 1. Wall thickening and submucosal edema of the descending and proximal to mid sigmoid colon with surrounding pericolonic haziness,  favored to reflect a nonspecific colitis. Sigmoid colonic diverticulosis with mildly inflamed appearance of a mid sigmoid colonic diverticulum, which may be reactive to colitis. No evidence of perforation or abscess. 2.  Small-to-moderate hiatal hernia. 3. Aortic atherosclerosis. Electronically Signed   By: Harrietta Sherry M.D.   On: 10/18/2024 11:05    Microbiology: Results for orders placed or performed during the hospital encounter of 09/19/21  Resp Panel by RT-PCR (Flu A&B, Covid) Nasopharyngeal Swab     Status: None   Collection Time: 09/19/21  2:03 PM   Specimen: Nasopharyngeal Swab; Nasopharyngeal(NP) swabs in vial transport medium  Result Value Ref Range Status   SARS Coronavirus 2 by RT PCR NEGATIVE NEGATIVE Final    Comment: (NOTE) SARS-CoV-2 target nucleic acids are NOT DETECTED.  The SARS-CoV-2 RNA is generally detectable in upper respiratory specimens during the acute phase of infection. The lowest concentration of SARS-CoV-2 viral copies this assay can detect is 138 copies/mL. A negative result does not preclude SARS-Cov-2 infection and should not be used as the sole basis for treatment or other patient management decisions. A negative result may occur with  improper specimen collection/handling, submission of specimen other than nasopharyngeal swab, presence of viral mutation(s) within the areas targeted by this assay, and inadequate number of viral copies(<138 copies/mL). A negative result must be combined with clinical observations, patient history, and epidemiological information. The expected result is Negative.  Fact Sheet for Patients:  bloggercourse.com  Fact Sheet for Healthcare Providers:  seriousbroker.it  This test is no t yet approved or cleared by the [place]  FDA and  has been authorized for detection and/or diagnosis of SARS-CoV-2 by FDA under an Emergency Use Authorization (EUA). This EUA will remain  in effect (meaning this test can be used) for the duration of the COVID-19 declaration under Section 564(b)(1) of the Act, 21 U.S.C.section 360bbb-3(b)(1), unless the authorization is terminated  or revoked  sooner.       Influenza A by PCR NEGATIVE NEGATIVE Final   Influenza B by PCR NEGATIVE NEGATIVE Final    Comment: (NOTE) The Xpert Xpress SARS-CoV-2/FLU/RSV plus assay is intended as an aid in the diagnosis of influenza from Nasopharyngeal swab specimens and should not be used as a sole basis for treatment. Nasal washings and aspirates are unacceptable for Xpert Xpress SARS-CoV-2/FLU/RSV testing.  Fact Sheet for Patients: bloggercourse.com  Fact Sheet for Healthcare Providers: seriousbroker.it  This test is not yet approved or cleared by the [place]  FDA and has been authorized for detection and/or diagnosis of SARS-CoV-2 by FDA under an Emergency Use Authorization (EUA). This EUA will remain in effect (meaning this test can be used) for the duration of the COVID-19 declaration under Section 564(b)(1) of the Act, 21 U.S.C. section 360bbb-3(b)(1), unless the authorization is terminated or revoked.  Performed at [facility], [address], [place], [place] [address]     Labs: CBC: Recent Labs  Lab 10/18/24 0920 10/18/24 1234 10/19/24 0001 10/19/24 0624 10/19/24 1318 10/19/24 2020 10/20/24 0500  WBC 14.3*  --   --  6.7  --   --  8.1  HGB 15.1*   < > 13.6 13.6 13.9 14.7 14.3  HCT 42.8   < > 39.6 39.5 41.4 43.8 41.7  MCV 82.6  --   --  84.9  --   --  86.0  PLT 258  --   --  188  --   --  215   < > = values in  this interval not displayed.   Basic Metabolic Panel: Recent Labs  Lab 10/18/24 0920 10/18/24 2159 10/19/24 0624 10/20/24 0500  NA 121*  --  137 138  K 3.3*  --  4.2 3.8  CL 86*  --  108 106  CO2 20*  --  22 22  GLUCOSE 135*  --  87 91  BUN 12  --  18 17  CREATININE 0.74  --  0.88 0.79  CALCIUM 9.0  --  8.3* 8.6*  MG  --  1.9  --   --   PHOS  --  3.8  --   --    Liver Function Tests: Recent Labs  Lab 10/18/24 0920 10/19/24 0624  AST 77* 65*  ALT 123* 100*  ALKPHOS 70 61  BILITOT  0.6 0.4  PROT 8.0 6.3*  ALBUMIN  4.2 3.4*   CBG: Recent Labs  Lab 10/19/24 0715  GLUCAP 83    Discharge time spent: 36 minutes.   Signed: Elgie Butter, MD [facility]  "

## 2024-10-30 ENCOUNTER — Other Ambulatory Visit: Payer: Self-pay | Admitting: Neurology

## 2024-11-17 ENCOUNTER — Ambulatory Visit: Admitting: Gastroenterology
